# Patient Record
Sex: Female | Born: 1935 | Race: Black or African American | Hispanic: No | Smoking: Never smoker
Health system: Southern US, Community
[De-identification: ages and names within clinical notes are randomized; demographics above are authoritative.]

## PROBLEM LIST (undated history)

## (undated) DIAGNOSIS — F0280 Dementia in other diseases classified elsewhere without behavioral disturbance: Secondary | ICD-10-CM

## (undated) DIAGNOSIS — G309 Alzheimer's disease, unspecified: Secondary | ICD-10-CM

## (undated) DIAGNOSIS — F0390 Unspecified dementia without behavioral disturbance: Secondary | ICD-10-CM

## (undated) DIAGNOSIS — R32 Unspecified urinary incontinence: Secondary | ICD-10-CM

## (undated) DIAGNOSIS — E785 Hyperlipidemia, unspecified: Secondary | ICD-10-CM

## (undated) DIAGNOSIS — F329 Major depressive disorder, single episode, unspecified: Secondary | ICD-10-CM

## (undated) DIAGNOSIS — F32A Depression, unspecified: Secondary | ICD-10-CM

## (undated) DIAGNOSIS — I10 Essential (primary) hypertension: Secondary | ICD-10-CM

## (undated) DIAGNOSIS — N390 Urinary tract infection, site not specified: Secondary | ICD-10-CM

---

## 2004-05-13 ENCOUNTER — Ambulatory Visit: Payer: Self-pay

## 2004-06-04 ENCOUNTER — Other Ambulatory Visit: Payer: Self-pay

## 2004-06-04 ENCOUNTER — Emergency Department: Payer: Self-pay | Admitting: Emergency Medicine

## 2004-06-06 ENCOUNTER — Ambulatory Visit: Payer: Self-pay

## 2004-09-25 ENCOUNTER — Ambulatory Visit: Payer: Self-pay

## 2004-10-12 ENCOUNTER — Emergency Department: Payer: Self-pay | Admitting: Emergency Medicine

## 2005-02-03 ENCOUNTER — Other Ambulatory Visit: Payer: Self-pay

## 2005-02-03 ENCOUNTER — Emergency Department: Payer: Self-pay | Admitting: Emergency Medicine

## 2005-11-18 ENCOUNTER — Ambulatory Visit: Payer: Self-pay

## 2007-01-11 ENCOUNTER — Ambulatory Visit: Payer: Self-pay | Admitting: Internal Medicine

## 2007-09-19 ENCOUNTER — Ambulatory Visit: Payer: Self-pay | Admitting: Internal Medicine

## 2008-02-19 ENCOUNTER — Emergency Department: Payer: Self-pay | Admitting: Emergency Medicine

## 2008-03-22 ENCOUNTER — Ambulatory Visit: Payer: Self-pay | Admitting: Internal Medicine

## 2008-08-06 ENCOUNTER — Ambulatory Visit: Payer: Self-pay | Admitting: Internal Medicine

## 2009-03-25 ENCOUNTER — Ambulatory Visit: Payer: Self-pay | Admitting: Internal Medicine

## 2014-10-24 DIAGNOSIS — E782 Mixed hyperlipidemia: Secondary | ICD-10-CM | POA: Diagnosis not present

## 2014-10-24 DIAGNOSIS — F329 Major depressive disorder, single episode, unspecified: Secondary | ICD-10-CM | POA: Diagnosis not present

## 2014-10-24 DIAGNOSIS — I10 Essential (primary) hypertension: Secondary | ICD-10-CM | POA: Diagnosis not present

## 2014-10-24 DIAGNOSIS — G301 Alzheimer's disease with late onset: Secondary | ICD-10-CM | POA: Diagnosis not present

## 2014-12-05 DIAGNOSIS — L89313 Pressure ulcer of right buttock, stage 3: Secondary | ICD-10-CM | POA: Diagnosis not present

## 2015-01-22 DIAGNOSIS — F329 Major depressive disorder, single episode, unspecified: Secondary | ICD-10-CM | POA: Diagnosis not present

## 2015-01-22 DIAGNOSIS — G301 Alzheimer's disease with late onset: Secondary | ICD-10-CM | POA: Diagnosis not present

## 2015-01-22 DIAGNOSIS — E782 Mixed hyperlipidemia: Secondary | ICD-10-CM | POA: Diagnosis not present

## 2015-01-22 DIAGNOSIS — I10 Essential (primary) hypertension: Secondary | ICD-10-CM | POA: Diagnosis not present

## 2015-04-03 DIAGNOSIS — L259 Unspecified contact dermatitis, unspecified cause: Secondary | ICD-10-CM | POA: Diagnosis not present

## 2015-06-15 DIAGNOSIS — G479 Sleep disorder, unspecified: Secondary | ICD-10-CM | POA: Diagnosis not present

## 2015-06-15 DIAGNOSIS — N390 Urinary tract infection, site not specified: Secondary | ICD-10-CM | POA: Diagnosis not present

## 2015-06-15 DIAGNOSIS — M25562 Pain in left knee: Secondary | ICD-10-CM | POA: Diagnosis not present

## 2015-06-15 DIAGNOSIS — R350 Frequency of micturition: Secondary | ICD-10-CM | POA: Diagnosis not present

## 2015-06-16 DIAGNOSIS — R350 Frequency of micturition: Secondary | ICD-10-CM | POA: Diagnosis not present

## 2015-06-17 DIAGNOSIS — M25561 Pain in right knee: Secondary | ICD-10-CM | POA: Diagnosis not present

## 2015-06-17 DIAGNOSIS — M25562 Pain in left knee: Secondary | ICD-10-CM | POA: Diagnosis not present

## 2015-06-17 DIAGNOSIS — R5383 Other fatigue: Secondary | ICD-10-CM | POA: Diagnosis not present

## 2015-06-24 DIAGNOSIS — M170 Bilateral primary osteoarthritis of knee: Secondary | ICD-10-CM | POA: Diagnosis not present

## 2015-07-23 DIAGNOSIS — R300 Dysuria: Secondary | ICD-10-CM | POA: Diagnosis not present

## 2015-07-24 DIAGNOSIS — R300 Dysuria: Secondary | ICD-10-CM | POA: Diagnosis not present

## 2015-08-23 DIAGNOSIS — L89153 Pressure ulcer of sacral region, stage 3: Secondary | ICD-10-CM | POA: Diagnosis not present

## 2015-08-23 DIAGNOSIS — L89322 Pressure ulcer of left buttock, stage 2: Secondary | ICD-10-CM | POA: Diagnosis not present

## 2015-08-23 DIAGNOSIS — G301 Alzheimer's disease with late onset: Secondary | ICD-10-CM | POA: Diagnosis not present

## 2015-08-23 DIAGNOSIS — M25562 Pain in left knee: Secondary | ICD-10-CM | POA: Diagnosis not present

## 2015-09-26 ENCOUNTER — Encounter: Payer: Self-pay | Admitting: Emergency Medicine

## 2015-09-26 ENCOUNTER — Emergency Department: Payer: Medicare Other

## 2015-09-26 ENCOUNTER — Observation Stay
Admission: EM | Admit: 2015-09-26 | Discharge: 2015-09-28 | Disposition: A | Discharge status: Home / Self Care | Payer: Medicare Other | Attending: Internal Medicine | Admitting: Internal Medicine

## 2015-09-26 DIAGNOSIS — G309 Alzheimer's disease, unspecified: Secondary | ICD-10-CM | POA: Diagnosis not present

## 2015-09-26 DIAGNOSIS — E785 Hyperlipidemia, unspecified: Secondary | ICD-10-CM | POA: Insufficient documentation

## 2015-09-26 DIAGNOSIS — L89153 Pressure ulcer of sacral region, stage 3: Secondary | ICD-10-CM | POA: Diagnosis not present

## 2015-09-26 DIAGNOSIS — I10 Essential (primary) hypertension: Secondary | ICD-10-CM | POA: Diagnosis not present

## 2015-09-26 DIAGNOSIS — E860 Dehydration: Secondary | ICD-10-CM | POA: Insufficient documentation

## 2015-09-26 DIAGNOSIS — R7989 Other specified abnormal findings of blood chemistry: Secondary | ICD-10-CM

## 2015-09-26 DIAGNOSIS — L89322 Pressure ulcer of left buttock, stage 2: Secondary | ICD-10-CM | POA: Diagnosis not present

## 2015-09-26 DIAGNOSIS — R109 Unspecified abdominal pain: Secondary | ICD-10-CM | POA: Diagnosis not present

## 2015-09-26 DIAGNOSIS — E876 Hypokalemia: Secondary | ICD-10-CM | POA: Insufficient documentation

## 2015-09-26 DIAGNOSIS — D72829 Elevated white blood cell count, unspecified: Secondary | ICD-10-CM | POA: Insufficient documentation

## 2015-09-26 DIAGNOSIS — K5730 Diverticulosis of large intestine without perforation or abscess without bleeding: Secondary | ICD-10-CM | POA: Diagnosis not present

## 2015-09-26 DIAGNOSIS — L89159 Pressure ulcer of sacral region, unspecified stage: Secondary | ICD-10-CM | POA: Diagnosis not present

## 2015-09-26 DIAGNOSIS — R778 Other specified abnormalities of plasma proteins: Secondary | ICD-10-CM

## 2015-09-26 DIAGNOSIS — I214 Non-ST elevation (NSTEMI) myocardial infarction: Secondary | ICD-10-CM | POA: Diagnosis not present

## 2015-09-26 DIAGNOSIS — N281 Cyst of kidney, acquired: Secondary | ICD-10-CM | POA: Insufficient documentation

## 2015-09-26 DIAGNOSIS — L8990 Pressure ulcer of unspecified site, unspecified stage: Secondary | ICD-10-CM

## 2015-09-26 DIAGNOSIS — F329 Major depressive disorder, single episode, unspecified: Secondary | ICD-10-CM | POA: Diagnosis not present

## 2015-09-26 DIAGNOSIS — R000 Tachycardia, unspecified: Secondary | ICD-10-CM | POA: Diagnosis not present

## 2015-09-26 DIAGNOSIS — R748 Abnormal levels of other serum enzymes: Secondary | ICD-10-CM | POA: Diagnosis not present

## 2015-09-26 DIAGNOSIS — R749 Abnormal serum enzyme level, unspecified: Secondary | ICD-10-CM | POA: Insufficient documentation

## 2015-09-26 DIAGNOSIS — F0280 Dementia in other diseases classified elsewhere without behavioral disturbance: Secondary | ICD-10-CM | POA: Insufficient documentation

## 2015-09-26 DIAGNOSIS — K5900 Constipation, unspecified: Secondary | ICD-10-CM

## 2015-09-26 DIAGNOSIS — Z880 Allergy status to penicillin: Secondary | ICD-10-CM | POA: Insufficient documentation

## 2015-09-26 DIAGNOSIS — K5641 Fecal impaction: Secondary | ICD-10-CM | POA: Diagnosis not present

## 2015-09-26 DIAGNOSIS — G301 Alzheimer's disease with late onset: Secondary | ICD-10-CM | POA: Diagnosis not present

## 2015-09-26 DIAGNOSIS — M8580 Other specified disorders of bone density and structure, unspecified site: Secondary | ICD-10-CM | POA: Insufficient documentation

## 2015-09-26 HISTORY — DX: Unspecified dementia, unspecified severity, without behavioral disturbance, psychotic disturbance, mood disturbance, and anxiety: F03.90

## 2015-09-26 HISTORY — DX: Unspecified urinary incontinence: R32

## 2015-09-26 HISTORY — DX: Major depressive disorder, single episode, unspecified: F32.9

## 2015-09-26 HISTORY — DX: Alzheimer's disease, unspecified: G30.9

## 2015-09-26 HISTORY — DX: Hyperlipidemia, unspecified: E78.5

## 2015-09-26 HISTORY — DX: Dementia in other diseases classified elsewhere, unspecified severity, without behavioral disturbance, psychotic disturbance, mood disturbance, and anxiety: F02.80

## 2015-09-26 HISTORY — DX: Urinary tract infection, site not specified: N39.0

## 2015-09-26 HISTORY — DX: Depression, unspecified: F32.A

## 2015-09-26 HISTORY — DX: Essential (primary) hypertension: I10

## 2015-09-26 LAB — HEPATIC FUNCTION PANEL
ALK PHOS: 68 U/L (ref 38–126)
ALT: 13 U/L — AB (ref 14–54)
AST: 42 U/L — ABNORMAL HIGH (ref 15–41)
Albumin: 3 g/dL — ABNORMAL LOW (ref 3.5–5.0)
BILIRUBIN INDIRECT: 0.9 mg/dL (ref 0.3–0.9)
Bilirubin, Direct: 0.3 mg/dL (ref 0.1–0.5)
TOTAL PROTEIN: 6.8 g/dL (ref 6.5–8.1)
Total Bilirubin: 1.2 mg/dL (ref 0.3–1.2)

## 2015-09-26 LAB — CBC
HCT: 36.9 % (ref 35.0–47.0)
HEMOGLOBIN: 12.3 g/dL (ref 12.0–16.0)
MCH: 31 pg (ref 26.0–34.0)
MCHC: 33.5 g/dL (ref 32.0–36.0)
MCV: 92.6 fL (ref 80.0–100.0)
Platelets: 290 10*3/uL (ref 150–440)
RBC: 3.99 MIL/uL (ref 3.80–5.20)
RDW: 13.6 % (ref 11.5–14.5)
WBC: 13.6 10*3/uL — ABNORMAL HIGH (ref 3.6–11.0)

## 2015-09-26 LAB — BASIC METABOLIC PANEL
ANION GAP: 14 (ref 5–15)
BUN: 20 mg/dL (ref 6–20)
CALCIUM: 9.8 mg/dL (ref 8.9–10.3)
CHLORIDE: 110 mmol/L (ref 101–111)
CO2: 22 mmol/L (ref 22–32)
Creatinine, Ser: 0.61 mg/dL (ref 0.44–1.00)
GFR calc non Af Amer: >60 mL/min (ref >60)
Glucose, Bld: 129 mg/dL — ABNORMAL HIGH (ref 65–99)
Potassium: 4.2 mmol/L (ref 3.5–5.1)
Sodium: 146 mmol/L — ABNORMAL HIGH (ref 135–145)

## 2015-09-26 LAB — TROPONIN I
Troponin I: 0.15 ng/mL — ABNORMAL HIGH (ref <0.031)
Troponin I: 0.16 ng/mL — ABNORMAL HIGH (ref <0.031)

## 2015-09-26 LAB — MAGNESIUM: Magnesium: 1.9 mg/dL (ref 1.7–2.4)

## 2015-09-26 LAB — PROTIME-INR
INR: 1.18
Prothrombin Time: 15.2 seconds — ABNORMAL HIGH (ref 11.4–15.0)

## 2015-09-26 LAB — APTT: APTT: 32 s (ref 24–36)

## 2015-09-26 MED ORDER — ACETAMINOPHEN 650 MG RE SUPP: 650.0000 mg | Freq: Four times a day (QID) | RECTAL | Status: DC | PRN

## 2015-09-26 MED ORDER — METOPROLOL TARTRATE 25 MG PO TABS
12.5000 mg | ORAL_TABLET | Freq: Two times a day (BID) | ORAL | Status: DC
  Administered 2015-09-26 – 2015-09-28 (×3): 12.5 mg via ORAL
  Filled 2015-09-26 (×4): qty 1

## 2015-09-26 MED ORDER — NITROGLYCERIN 0.4 MG SL SUBL: 0.4000 mg | SUBLINGUAL_TABLET | SUBLINGUAL | Status: DC | PRN

## 2015-09-26 MED ORDER — MIRTAZAPINE 15 MG PO TABS
15.0000 mg | ORAL_TABLET | Freq: Every day | ORAL | Status: DC
  Administered 2015-09-26 – 2015-09-27 (×2): 15 mg via ORAL
  Filled 2015-09-26 (×2): qty 1

## 2015-09-26 MED ORDER — SODIUM CHLORIDE 0.9% FLUSH: 3.0000 mL | INTRAVENOUS | Status: DC | PRN

## 2015-09-26 MED ORDER — SODIUM CHLORIDE 0.9 % IV SOLN
Freq: Once | INTRAVENOUS | Status: AC
  Administered 2015-09-26: 15:00:00 via INTRAVENOUS

## 2015-09-26 MED ORDER — ONDANSETRON HCL 4 MG/2ML IJ SOLN: 4.0000 mg | Freq: Four times a day (QID) | INTRAMUSCULAR | Status: DC | PRN

## 2015-09-26 MED ORDER — ACETAMINOPHEN 325 MG PO TABS: 650.0000 mg | ORAL_TABLET | Freq: Four times a day (QID) | ORAL | Status: DC | PRN

## 2015-09-26 MED ORDER — ATORVASTATIN CALCIUM 20 MG PO TABS: 40.0000 mg | ORAL_TABLET | Freq: Every day | ORAL | Status: DC

## 2015-09-26 MED ORDER — HEPARIN (PORCINE) IN NACL 100-0.45 UNIT/ML-% IJ SOLN
800.0000 [IU]/h | INTRAMUSCULAR | Status: DC
  Administered 2015-09-26: 700 [IU]/h via INTRAVENOUS
  Filled 2015-09-26 (×2): qty 250

## 2015-09-26 MED ORDER — IOHEXOL 300 MG/ML  SOLN
80.0000 mL | Freq: Once | INTRAMUSCULAR | Status: AC | PRN
  Administered 2015-09-26: 80 mL via INTRAVENOUS

## 2015-09-26 MED ORDER — MEMANTINE HCL ER 28 MG PO CP24
28.0000 mg | ORAL_CAPSULE | Freq: Every day | ORAL | Status: DC
  Administered 2015-09-27 – 2015-09-28 (×2): 28 mg via ORAL
  Filled 2015-09-26 (×3): qty 1

## 2015-09-26 MED ORDER — ASPIRIN 81 MG PO CHEW
81.0000 mg | CHEWABLE_TABLET | Freq: Every day | ORAL | Status: DC
  Administered 2015-09-27 – 2015-09-28 (×2): 81 mg via ORAL
  Filled 2015-09-26 (×2): qty 1

## 2015-09-26 MED ORDER — ALBUTEROL SULFATE (2.5 MG/3ML) 0.083% IN NEBU: 2.5000 mg | INHALATION_SOLUTION | RESPIRATORY_TRACT | Status: DC | PRN

## 2015-09-26 MED ORDER — HEPARIN BOLUS VIA INFUSION
3500.0000 [IU] | Freq: Once | INTRAVENOUS | Status: AC
  Administered 2015-09-26: 3500 [IU] via INTRAVENOUS
  Filled 2015-09-26: qty 3500

## 2015-09-26 MED ORDER — SODIUM CHLORIDE 0.9% FLUSH
3.0000 mL | Freq: Two times a day (BID) | INTRAVENOUS | Status: DC
  Administered 2015-09-28: 3 mL via INTRAVENOUS

## 2015-09-26 MED ORDER — SIMVASTATIN 40 MG PO TABS
40.0000 mg | ORAL_TABLET | Freq: Every day | ORAL | Status: DC
  Administered 2015-09-27: 40 mg via ORAL
  Filled 2015-09-26: qty 1

## 2015-09-26 MED ORDER — ONDANSETRON HCL 4 MG PO TABS
4.0000 mg | ORAL_TABLET | Freq: Four times a day (QID) | ORAL | Status: DC | PRN
Start: 2015-09-26 — End: 2015-09-28

## 2015-09-26 MED ORDER — SODIUM CHLORIDE 0.9 % IV SOLN: 250.0000 mL | INTRAVENOUS | Status: DC | PRN

## 2015-09-26 MED ORDER — SODIUM CHLORIDE 0.9 % IV SOLN
INTRAVENOUS | Status: DC
Start: 2015-09-26 — End: 2015-09-28
  Administered 2015-09-26 – 2015-09-27 (×4): via INTRAVENOUS

## 2015-09-26 MED ORDER — CITALOPRAM HYDROBROMIDE 20 MG PO TABS
10.0000 mg | ORAL_TABLET | Freq: Every day | ORAL | Status: DC
  Administered 2015-09-26 – 2015-09-27 (×2): 10 mg via ORAL
  Filled 2015-09-26 (×3): qty 1

## 2015-09-26 NOTE — ED Notes (Signed)
Went to [place] [facility] for  pressure ulcer care and they noted her tachycardia. No cardiac hx - has advance alz-dementia. dtr was asking about hospice as to whether she could be admitted there until she is better.

## 2015-09-26 NOTE — H&P (Addendum)
[facility] - [place] at [facility]   PATIENT NAME: [name]    MR#:  [id number]  DATE OF BIRTH:  1935/12/28  DATE OF ADMISSION:  09/26/2015  PRIMARY CARE PHYSICIAN: No primary care provider on file.   REQUESTING/REFERRING PHYSICIAN: Arnaldo Natal, MD  CHIEF COMPLAINT:   Chief Complaint  Patient presents with  . Tachycardia   Tachycardia. HISTORY OF PRESENT ILLNESS:  [name]  is a 80 y.o. female with a known history of dementia, UTI, hypertension and hyperlipidemia. The patient was sent by primary care physician due to tachycardia today. The patient is demented, unable to provide any information. According to her daughter, the patient has had chronic diarrhea, possible incontinence. She developed decubitus skin tear around rectum and buttocks. The patient was sent to permit care physician for evaluation of the skin tear around rectum. But she was found to have tachycardia and then sent to ED for further evaluation. Her heart rates was about 120 to 130. In addition, she has elevated troponin at 0.15.  PAST MEDICAL HISTORY:   Past Medical History  Diagnosis Date  . Dementia   . Alzheimer's dementia   . Hyperlipidemia   . Depression   . Hypertension   . Incontinence   . UTI (lower urinary tract infection)     PAST SURGICAL HISTORY:  History reviewed. No pertinent past surgical history.  SOCIAL HISTORY:   Social History  Substance Use Topics  . Smoking status: Never Smoker   . Smokeless tobacco: Not on file  . Alcohol Use: No    FAMILY HISTORY:  History reviewed. No pertinent family history.  DRUG ALLERGIES:   Allergies  Allergen Reactions  . Penicillins     REVIEW OF SYSTEMS:  Unable to obtain.  MEDICATIONS AT HOME:   Prior to Admission medications   Medication Sig Start Date End Date Taking? Authorizing Provider  citalopram (CELEXA) 10 MG tablet Take 1 tablet by mouth daily. 09/06/15  Yes Historical Provider, MD   mirtazapine (REMERON) 15 MG tablet Take 1 tablet by mouth at bedtime.  09/06/15  Yes Historical Provider, MD  NAMENDA XR 28 MG CP24 24 hr capsule Take 1 tablet by mouth daily. 08/05/15  Yes Historical Provider, MD  simvastatin (ZOCOR) 40 MG tablet Take 1 tablet by mouth daily at 6 PM.  08/06/15  Yes Historical Provider, MD  valsartan (DIOVAN) 40 MG tablet Take 1 tablet by mouth daily. 08/23/15  Yes Historical Provider, MD      VITAL SIGNS:  Blood pressure 122/76, pulse 120, temperature 98.7 F (37.1 C), temperature source Axillary, resp. rate 16, height 5\' 3"  (1.6 m), weight 58.968 kg (130 lb), SpO2 98 %.  PHYSICAL EXAMINATION:  GENERAL:  80 y.o.-year-old patient lying in the bed with no acute distress.  EYES: Unable to exam since the patient closed her eyes. HEENT: Head atraumatic, normocephalic. Unable to exam Oropharynx and nasopharynx. NECK:  Supple, no jugular venous distention. No thyroid enlargement, no tenderness.  LUNGS: Normal breath sounds bilaterally, no wheezing, rales,rhonchi or crepitation. No use of accessory muscles of respiration.  CARDIOVASCULAR: S1, S2 normal. No murmurs, rubs, or gallops.  ABDOMEN: Soft, nontender, nondistended. Bowel sounds present. No organomegaly or mass.  EXTREMITIES: No pedal edema, cyanosis, or clubbing.  NEUROLOGIC: Demented.  PSYCHIATRIC: The patient is severely demented. SKIN: No obvious rash, lesion, but has buttock ulcer.   LABORATORY PANEL:   CBC  Recent Labs Lab 09/26/15 1232  WBC 13.6*  HGB 12.3  HCT  36.9  PLT 290   ------------------------------------------------------------------------------------------------------------------  Chemistries   Recent Labs Lab 09/26/15 1232  NA 146*  K 4.2  CL 110  CO2 22  GLUCOSE 129*  BUN 20  CREATININE 0.61  CALCIUM 9.8  AST 42*  ALT 13*  ALKPHOS 68  BILITOT 1.2    ------------------------------------------------------------------------------------------------------------------  Cardiac Enzymes  Recent Labs Lab 09/26/15 1232  TROPONINI 0.15*   ------------------------------------------------------------------------------------------------------------------  RADIOLOGY:  Ct Abdomen Pelvis W Contrast  09/26/2015  CLINICAL DATA:  Abdominal pain EXAM: CT ABDOMEN AND PELVIS WITH CONTRAST TECHNIQUE: Multidetector CT imaging of the abdomen and pelvis was performed using the standard protocol following bolus administration of intravenous contrast. CONTRAST:  80mL OMNIPAQUE IOHEXOL 300 MG/ML  SOLN COMPARISON:  None. FINDINGS: Lung bases are unremarkable. Sagittal images of the spine shows diffuse osteopenia. There is mild about 4 mm anterolisthesis L5 on S1 vertebral body. Heart size within normal limits.  Mild levoscoliosis lumbar spine. Enhanced liver shows no focal mass. There is mild intrahepatic biliary ductal dilatation. There gallbladder is not identified probable surgically absent. No aortic aneurysm. Mild atherosclerotic calcifications of distal abdominal aorta. The pancreas, spleen and adrenal glands are unremarkable. Kidneys are symmetrical in size and enhancement. No hydronephrosis or hydroureter. Delayed renal images shows bilateral renal symmetrical excretion. Bilateral visualized proximal ureter is unremarkable. Tiny left renal cysts are noted the largest measures 6.5 mm. There is no small bowel obstruction.  No ascites or free air. Moderate stool noted within cecum and right colon. Some colonic gas noted within transverse colon. Few colonic diverticula are noted descending colon. No evidence of colitis or diverticulitis. Multiple colonic diverticula are noted sigmoid colon. There is no evidence of acute diverticulitis. There is significant distension of the rectum with abundant stool measures at least 8.5 cm in diameter. This is consistent with fecal  impaction. Axial image 66 there is a skin defect and probable decubitus wound in left lower gluteal region adjacent to the midline please see axial image 69. There is air within subcutaneous wound measures about 1.9 cm. There is no evidence of air-fluid level to suggest an abscess. Some adjacent skin is thickening is noted probable mild cellulitis. No destructive bony lesions are noted within pelvis. Degenerative changes bilateral SI joints. The urinary bladder is decompressed. IMPRESSION: 1. No hydronephrosis or hydroureter. Small left renal cysts are noted. 2. No small bowel obstruction.  No ascites or free air. 3. Moderate stool noted in right colon and proximal transverse colon. Significant distension of the rectum with abundant stool up to 8.5 cm. Findings highly suspicious for distal fecal impaction. 4. Axial image 66 there is a skin defect and probable decubitus wound in left lower gluteal region adjacent to the midline please see axial image 69. There is air within subcutaneous wound measures about 1.9 cm. There is no evidence of air-fluid level to suggest an abscess. Some adjacent skin is thickening is noted probable mild cellulitis. 5. Osteopenia and mild degenerative changes lumbar spine. Mild degenerative changes bilateral SI joints. Electronically Signed   By: Natasha Mead M.D.   On: 09/26/2015 16:34   Dg Chest Portable 1 View  09/26/2015  CLINICAL DATA:  Tachycardia. EXAM: PORTABLE CHEST 1 VIEW COMPARISON:  02/03/2005 FINDINGS: Lungs are clear without airspace disease or pulmonary edema. There appears to be a large skin fold overlying the left chest. Patient is mildly rotated towards the right side. Heart and mediastinum are within normal limits. Negative for pneumothorax. IMPRESSION: No acute chest findings. Electronically Signed  By: Richarda Overlie M.D.   On: 09/26/2015 13:59    EKG:   Orders placed or performed during the hospital encounter of 09/26/15  . EKG 12-Lead  . EKG 12-Lead  . ED EKG  within 10 minutes  . ED EKG within 10 minutes    IMPRESSION AND PLAN:   Possible non-STEMI.  Start aspirin and Zocor, start heparin drip, follow-up troponin level and cardiology consult.  Tachycardia. Start Lopressor twice a day.  Leukocytosis. Follow-up CBC and a urinalysis.  Sacral DU. Wound care.  Hypertension,  hold volsartan due to and normal blood pressure, start Lopressor Hyperlipidemia, start Zocor.    All the records are reviewed and case discussed with ED provider. Management plans discussed with the patient's daughter [facility]) and they are in agreement.  CODE STATUS: DO NOT RESUSCITATE  TOTAL TIME TAKING CARE OF THIS PATIENT: 55 minutes.    Shaune Pollack M.D on 09/26/2015 at 5:35 PM  Between 7am to 6pm - Pager - [phone]  After 6pm go to [email or web address] - password [email or web address]  [place] [facility]  Office  [phone]  CC: Primary care physician; No primary care provider on file.

## 2015-09-26 NOTE — ED Provider Notes (Signed)
[facility] Emergency Department Provider Note  ____________________________________________  Time seen: Approximately 2:37 PM  I have reviewed the triage vital signs and the nursing notes.   HISTORY  Chief Complaint Tachycardia  history limited by severe dementia   HPI [name] is a 80 y.o. female patient being cared for at home. She's been having diarrhea. She's developed a decubitus around her rectum and some more on the buttocks does area of skin breakdown and appears to be healing on the right hip and some swelling in the left hip. Patient with [facility] to follow-up for the decubiti was noted be tachycardic so was sent here. She has severe dementia and unable to get a history from her. History obtained is from family. Family additionally reports that usually patient's diaper soaked in the morning when she wakes up and today it was not. Today in fact it was completely dry.   Past Medical History  Diagnosis Date  . Dementia   . Alzheimer's dementia   . Hyperlipidemia   . Depression   . Hypertension   . Incontinence   . UTI (lower urinary tract infection)     There are no active problems to display for this patient.   History reviewed. No pertinent past surgical history.  No current outpatient prescriptions on file.  Allergies Penicillins  History reviewed. No pertinent family history.  Social History Social History  Substance Use Topics  . Smoking status: Never Smoker   . Smokeless tobacco: None  . Alcohol Use: No    Review of Systems Unobtainable due to dementia ____________________________________________   PHYSICAL EXAM:  VITAL SIGNS: ED Triage Vitals  Enc Vitals Group     BP 09/26/15 1214 101/73 mmHg     Pulse Rate 09/26/15 1214 128     Resp 09/26/15 1214 16     Temp 09/26/15 1214 98.7 F (37.1 C)     Temp Source 09/26/15 1214 Axillary     SpO2 09/26/15 1214 100 %     Weight 09/26/15 1214 130 lb (58.968 kg)      Height 09/26/15 1214  (1.6 m)     Head Cir --      Peak Flow --      Pain Score --      Pain Loc --      Pain Edu? --      Excl. in GC? --     Constitutional: Alert  in no acute distress. Eyes: Patient's keeping her eyes closed holes shot tighter when I try to open them she'll not open her eyes Head: Atraumatic. Nose: No congestion/rhinnorhea. Mouth/Throat: Patient will not open her mouth either but her lips look dry Neck: No stridor.   Cardiovascular: Normal rate, regular rhythm. Grossly normal heart sounds.  Good peripheral circulation. Respiratory: Normal respiratory effort.  No retractions. Lungs CTAB. Gastrointestinal: Patient winces on palpation of the abdomen No distention. No abdominal bruits. No CVA tenderness. Musculoskeletal: There is no edema in the extremities however the skin changes and swelling is noted in H&P of present Neurologic:  Normal speech and language. No gross focal neurologic deficits are appreciated. No gait instability. Skin:  Decubiti as noted in H&P   ____________________________________________   LABS (all labs ordered are listed, but only abnormal results are displayed)  Labs Reviewed  BASIC METABOLIC PANEL - Abnormal; Notable for the following:    Sodium 146 (*)    Glucose, Bld 129 (*)    All other components within normal  limits  CBC - Abnormal; Notable for the following:    WBC 13.6 (*)    All other components within normal limits  HEPATIC FUNCTION PANEL - Abnormal; Notable for the following:    Albumin 3.0 (*)    AST 42 (*)    ALT 13 (*)    All other components within normal limits  TROPONIN I - Abnormal; Notable for the following:    Troponin I 0.15 (*)    All other components within normal limits  URINALYSIS COMPLETEWITH MICROSCOPIC ([facility] ONLY)   ____________________________________________  EKG  EKG read and interpreted by me shows sinus tachycardia rate of 138 left axis no acute ST-T wave changes baseline is very  poor is difficult to evaluate EKG well ____________________________________________  RADIOLOGY  Pending Dr. Scotty Court will follow-up ____________________________________________   PROCEDURES    ____________________________________________   INITIAL IMPRESSION / ASSESSMENT AND PLAN / ED COURSE  Pertinent labs & imaging results that were available during my care of the patient were reviewed by me and considered in my medical decision making (see chart for details).  She is signed out to Dr. Scotty Court ____________________________________________   FINAL CLINICAL IMPRESSION(S) / ED DIAGNOSES  Final diagnoses:  Tachycardia  Elevated troponin      Arnaldo Natal, MD 09/26/15 [phone]

## 2015-09-26 NOTE — Progress Notes (Signed)
Pt needs urine specimen. However pt is incontinent. Nursing staff attempted to put pt on bedpan, attempts were unsuccessful. MD Sudini notified. Order given for in and out catheter.  Mayra Neer M

## 2015-09-26 NOTE — Progress Notes (Addendum)
ANTICOAGULATION CONSULT NOTE - Initial Consult  Pharmacy Consult for Heparin  Indication: chest pain/ACS  Allergies  Allergen Reactions  . Penicillins     Patient Measurements: Height:  (160 cm) Weight: 130 lb (58.968 kg) IBW/kg (Calculated) : 52.4 Heparin Dosing Weight: 59 kg   Vital Signs: Temp: 98.7 F (37.1 C) (02/23 1214) Temp Source: Axillary (02/23 1214) BP: 99/70 mmHg (02/23 1807) Pulse Rate: 103 (02/23 1807)  Labs:  Recent Labs  09/26/15 1232  HGB 12.3  HCT 36.9  PLT 290  CREATININE 0.61  TROPONINI 0.15*    Estimated Creatinine Clearance: 48.7 mL/min (by C-G formula based on Cr of 0.61).   Medical History: Past Medical History  Diagnosis Date  . Dementia   . Alzheimer's dementia   . Hyperlipidemia   . Depression   . Hypertension   . Incontinence   . UTI (lower urinary tract infection)     Medications:  Prescriptions prior to admission  Medication Sig Dispense Refill Last Dose  . citalopram (CELEXA) 10 MG tablet Take 1 tablet by mouth daily.   09/26/2015 at Unknown time  . mirtazapine (REMERON) 15 MG tablet Take 1 tablet by mouth at bedtime.    09/25/2015 at Unknown time  . NAMENDA XR 28 MG CP24 24 hr capsule Take 1 tablet by mouth daily.     . simvastatin (ZOCOR) 40 MG tablet Take 1 tablet by mouth daily at 6 PM.      . valsartan (DIOVAN) 40 MG tablet Take 1 tablet by mouth daily.       Assessment: CrCl = 48.7 ml/min  Pharmacy consulted to dose heparin in this 80 year old female admitted with ACS/NSTEMI. No prior anticoag noted.   Goal of Therapy:  Heparin level 0.3-0.7 units/ml Monitor platelets by anticoagulation protocol: Yes   Plan:  Give 3500 units bolus x 1 Start heparin infusion at 700 units/hr  Will order baseline aptt and INR. Will draw 1st HL 8 hrs after start of drip on 2/24 @ 0400.   Hagan Maltz D 09/26/2015,6:35 PM

## 2015-09-26 NOTE — ED Notes (Signed)
Per daught pt's depends was dry last night when normally she wakes up soaked

## 2015-09-26 NOTE — ED Notes (Signed)
Patient transported to CT 

## 2015-09-27 ENCOUNTER — Encounter: Payer: Self-pay | Admitting: Nurse Practitioner

## 2015-09-27 ENCOUNTER — Inpatient Hospital Stay (HOSPITAL_BASED_OUTPATIENT_CLINIC_OR_DEPARTMENT_OTHER)
Admit: 2015-09-27 | Discharge: 2015-09-27 | Disposition: A | Discharge status: Home / Self Care | Payer: Medicare Other | Attending: Nurse Practitioner | Admitting: Nurse Practitioner

## 2015-09-27 DIAGNOSIS — L89159 Pressure ulcer of sacral region, unspecified stage: Secondary | ICD-10-CM

## 2015-09-27 DIAGNOSIS — R000 Tachycardia, unspecified: Secondary | ICD-10-CM | POA: Diagnosis not present

## 2015-09-27 DIAGNOSIS — D72829 Elevated white blood cell count, unspecified: Secondary | ICD-10-CM

## 2015-09-27 DIAGNOSIS — I10 Essential (primary) hypertension: Secondary | ICD-10-CM | POA: Diagnosis not present

## 2015-09-27 DIAGNOSIS — K5900 Constipation, unspecified: Secondary | ICD-10-CM | POA: Diagnosis not present

## 2015-09-27 DIAGNOSIS — R7989 Other specified abnormal findings of blood chemistry: Secondary | ICD-10-CM

## 2015-09-27 DIAGNOSIS — L8990 Pressure ulcer of unspecified site, unspecified stage: Secondary | ICD-10-CM

## 2015-09-27 DIAGNOSIS — R778 Other specified abnormalities of plasma proteins: Secondary | ICD-10-CM

## 2015-09-27 DIAGNOSIS — E785 Hyperlipidemia, unspecified: Secondary | ICD-10-CM | POA: Diagnosis not present

## 2015-09-27 LAB — BASIC METABOLIC PANEL
Anion gap: 5 (ref 5–15)
BUN: 15 mg/dL (ref 6–20)
CO2: 25 mmol/L (ref 22–32)
Calcium: 8.9 mg/dL (ref 8.9–10.3)
Chloride: 118 mmol/L — ABNORMAL HIGH (ref 101–111)
Creatinine, Ser: 0.6 mg/dL (ref 0.44–1.00)
GFR calc Af Amer: >60 mL/min (ref >60)
GFR calc non Af Amer: >60 mL/min (ref >60)
Glucose, Bld: 76 mg/dL (ref 65–99)
Potassium: 3.1 mmol/L — ABNORMAL LOW (ref 3.5–5.1)
Sodium: 148 mmol/L — ABNORMAL HIGH (ref 135–145)

## 2015-09-27 LAB — URINALYSIS COMPLETE WITH MICROSCOPIC (ARMC ONLY)
BILIRUBIN URINE: NEGATIVE
Bacteria, UA: NONE SEEN
Glucose, UA: NEGATIVE mg/dL
Hgb urine dipstick: NEGATIVE
Leukocytes, UA: NEGATIVE
Nitrite: NEGATIVE
Protein, ur: NEGATIVE mg/dL
Specific Gravity, Urine: 1.032 — ABNORMAL HIGH (ref 1.005–1.030)
Squamous Epithelial / LPF: NONE SEEN
pH: 5 (ref 5.0–8.0)

## 2015-09-27 LAB — HEPARIN LEVEL (UNFRACTIONATED)
Heparin Unfractionated: 0.27 IU/mL — ABNORMAL LOW (ref 0.30–0.70)
Heparin Unfractionated: <0.1 IU/mL — ABNORMAL LOW (ref 0.30–0.70)

## 2015-09-27 LAB — CBC
HCT: 28.5 % — ABNORMAL LOW (ref 35.0–47.0)
Hemoglobin: 9.5 g/dL — ABNORMAL LOW (ref 12.0–16.0)
MCH: 31.1 pg (ref 26.0–34.0)
MCHC: 33.5 g/dL (ref 32.0–36.0)
MCV: 92.9 fL (ref 80.0–100.0)
Platelets: 169 10*3/uL (ref 150–440)
RBC: 3.07 MIL/uL — ABNORMAL LOW (ref 3.80–5.20)
RDW: 13.4 % (ref 11.5–14.5)
WBC: 6.2 10*3/uL (ref 3.6–11.0)

## 2015-09-27 LAB — TROPONIN I
TROPONIN I: 0.16 ng/mL — AB (ref <0.031)
Troponin I: 0.14 ng/mL — ABNORMAL HIGH (ref <0.031)

## 2015-09-27 MED ORDER — COLLAGENASE 250 UNIT/GM EX OINT
TOPICAL_OINTMENT | Freq: Every day | CUTANEOUS | Status: DC
  Administered 2015-09-27 – 2015-09-28 (×2): via TOPICAL
  Filled 2015-09-27: qty 30

## 2015-09-27 MED ORDER — LACTULOSE 10 GM/15ML PO SOLN
30.0000 g | Freq: Two times a day (BID) | ORAL | Status: DC
  Administered 2015-09-27: 30 g via ORAL
  Filled 2015-09-27 (×5): qty 60

## 2015-09-27 MED ORDER — MAGNESIUM HYDROXIDE 400 MG/5ML PO SUSP
30.0000 mL | Freq: Four times a day (QID) | ORAL | Status: DC | PRN
  Administered 2015-09-27: 30 mL via ORAL
  Filled 2015-09-27: qty 30

## 2015-09-27 MED ORDER — HEPARIN BOLUS VIA INFUSION
900.0000 [IU] | Freq: Once | INTRAVENOUS | Status: AC
  Administered 2015-09-27: 900 [IU] via INTRAVENOUS
  Filled 2015-09-27: qty 900

## 2015-09-27 MED ORDER — POLYETHYLENE GLYCOL 3350 17 G PO PACK
17.0000 g | PACK | Freq: Every day | ORAL | Status: DC
  Administered 2015-09-27: 17 g via ORAL
  Filled 2015-09-27 (×2): qty 1

## 2015-09-27 MED ORDER — DOCUSATE SODIUM 100 MG PO CAPS
100.0000 mg | ORAL_CAPSULE | Freq: Two times a day (BID) | ORAL | Status: DC
  Administered 2015-09-27 (×2): 100 mg via ORAL
  Filled 2015-09-27 (×3): qty 1

## 2015-09-27 NOTE — Progress Notes (Signed)
Pt tolerated in and out catheter well. 150 mL of urine obtained, specimen was sent. Will continue to monitor.   Mayra Neer M

## 2015-09-27 NOTE — Progress Notes (Signed)
[facility] - [place] at [facility]   PATIENT NAME: [name]    MR#:  [id number]  DATE OF BIRTH:  02-17-1936  SUBJECTIVE:   Daughter at bedside. Daughter reports the patient has had several episodes of constipation and then has had loose stools. Patient with dementia and unable to provide history of present illness.  REVIEW OF SYSTEMS:    Review of Systems  Unable to perform ROS   Tolerating Diet: yes      DRUG ALLERGIES:   Allergies  Allergen Reactions  . Penicillins     VITALS:  Blood pressure 116/67, pulse 93, temperature 98.3 F (36.8 C), temperature source Oral, resp. rate 20, height 5\' 3"  (1.6 m), weight 58.968 kg (130 lb), SpO2 98 %.  PHYSICAL EXAMINATION:   Physical Exam  Constitutional: She is well-developed, well-nourished, and in no distress. No distress.  HENT:  Head: Normocephalic and atraumatic.  Eyes: Pupils are equal, round, and reactive to light. No scleral icterus.  Neck: Neck supple. No JVD present. No tracheal deviation present.  Cardiovascular: Normal rate, regular rhythm and normal heart sounds.  Exam reveals no gallop and no friction rub.   No murmur heard. Pulmonary/Chest: Effort normal and breath sounds normal. No respiratory distress. She has no wheezes. She has no rales. She exhibits no tenderness.  Abdominal: Soft. Bowel sounds are normal. She exhibits mass. She exhibits no distension. There is no tenderness. There is no rebound and no guarding.  Musculoskeletal: Normal range of motion. She exhibits no edema.  Neurological: She is alert.  Skin: Skin is warm. No rash noted. She is not diaphoretic. No erythema.  Psychiatric: Judgment normal.      LABORATORY PANEL:   CBC  Recent Labs Lab 09/27/15 0647  WBC 6.2  HGB 9.5*  HCT 28.5*  PLT 169   ------------------------------------------------------------------------------------------------------------------  Chemistries   Recent Labs Lab  09/26/15 1232 09/26/15 1901 09/27/15 0647  NA 146*  --  148*  K 4.2  --  3.1*  CL 110  --  118*  CO2 22  --  25  GLUCOSE 129*  --  76  BUN 20  --  15  CREATININE 0.61  --  0.60  CALCIUM 9.8  --  8.9  MG  --  1.9  --   AST 42*  --   --   ALT 13*  --   --   ALKPHOS 68  --   --   BILITOT 1.2  --   --    ------------------------------------------------------------------------------------------------------------------  Cardiac Enzymes  Recent Labs Lab 09/26/15 1901 09/27/15 0025 09/27/15 0647  TROPONINI 0.16* 0.16* 0.14*   ------------------------------------------------------------------------------------------------------------------  RADIOLOGY:  Ct Abdomen Pelvis W Contrast  09/26/2015  CLINICAL DATA:  Abdominal pain EXAM: CT ABDOMEN AND PELVIS WITH CONTRAST TECHNIQUE: Multidetector CT imaging of the abdomen and pelvis was performed using the standard protocol following bolus administration of intravenous contrast. CONTRAST:  80mL OMNIPAQUE IOHEXOL 300 MG/ML  SOLN COMPARISON:  None. FINDINGS: Lung bases are unremarkable. Sagittal images of the spine shows diffuse osteopenia. There is mild about 4 mm anterolisthesis L5 on S1 vertebral body. Heart size within normal limits.  Mild levoscoliosis lumbar spine. Enhanced liver shows no focal mass. There is mild intrahepatic biliary ductal dilatation. There gallbladder is not identified probable surgically absent. No aortic aneurysm. Mild atherosclerotic calcifications of distal abdominal aorta. The pancreas, spleen and adrenal glands are unremarkable. Kidneys are symmetrical in size and enhancement. No hydronephrosis or hydroureter. Delayed renal images  shows bilateral renal symmetrical excretion. Bilateral visualized proximal ureter is unremarkable. Tiny left renal cysts are noted the largest measures 6.5 mm. There is no small bowel obstruction.  No ascites or free air. Moderate stool noted within cecum and right colon. Some colonic gas  noted within transverse colon. Few colonic diverticula are noted descending colon. No evidence of colitis or diverticulitis. Multiple colonic diverticula are noted sigmoid colon. There is no evidence of acute diverticulitis. There is significant distension of the rectum with abundant stool measures at least 8.5 cm in diameter. This is consistent with fecal impaction. Axial image 66 there is a skin defect and probable decubitus wound in left lower gluteal region adjacent to the midline please see axial image 69. There is air within subcutaneous wound measures about 1.9 cm. There is no evidence of air-fluid level to suggest an abscess. Some adjacent skin is thickening is noted probable mild cellulitis. No destructive bony lesions are noted within pelvis. Degenerative changes bilateral SI joints. The urinary bladder is decompressed. IMPRESSION: 1. No hydronephrosis or hydroureter. Small left renal cysts are noted. 2. No small bowel obstruction.  No ascites or free air. 3. Moderate stool noted in right colon and proximal transverse colon. Significant distension of the rectum with abundant stool up to 8.5 cm. Findings highly suspicious for distal fecal impaction. 4. Axial image 66 there is a skin defect and probable decubitus wound in left lower gluteal region adjacent to the midline please see axial image 69. There is air within subcutaneous wound measures about 1.9 cm. There is no evidence of air-fluid level to suggest an abscess. Some adjacent skin is thickening is noted probable mild cellulitis. 5. Osteopenia and mild degenerative changes lumbar spine. Mild degenerative changes bilateral SI joints. Electronically Signed   By: Natasha Mead M.D.   On: 09/26/2015 16:34   Dg Chest Portable 1 View  09/26/2015  CLINICAL DATA:  Tachycardia. EXAM: PORTABLE CHEST 1 VIEW COMPARISON:  02/03/2005 FINDINGS: Lungs are clear without airspace disease or pulmonary edema. There appears to be a large skin fold overlying the left  chest. Patient is mildly rotated towards the right side. Heart and mediastinum are within normal limits. Negative for pneumothorax. IMPRESSION: No acute chest findings. Electronically Signed   By: Richarda Overlie M.D.   On: 09/26/2015 13:59     ASSESSMENT AND PLAN:    80 year old female with dementia who presented with tachycardia.  1. Sinus tachycardia: Heart rates are improved.  2. Distal fecal impaction: CT scan is consistent with significant distention of the rectum with abundant stool up to 8 cm. We'll try stool softeners including lactulose and MiraLAX. We'll also have nurse try disimpaction. May need GI consultation and sigmoidoscopy for disimpaction.  3. Sacral pressure ulcer, present on admission: Continue wound care Cleanse sacral wound with NS and pat gently dry. Apply santyl to wound bed. 1/8 inch thickness (opaque). Fill wound depth with NS moist 2x2, cover with 4x4 gauze and ABD/tape. Change daily and PRN soilage.  4. Elevated troponin: This is demand ischemia likely from ulcer and fecal impaction. This is not ACS. Discontinue heparin drip.  5. Essential hypertension: She is currently on the Toprol. Watch blood pressures as low this morning.  6. Dementia: Continue Namenda and Celexa.  7. Hyperlipidemia: Continue Zocor  Management plans discussed with the patient's daughter and she is in agreement.  CODE STATUS: DR  TOTAL TIME TAKING CARE OF THIS PATIENT: 30 minutes.     POSSIBLE D/C 1-2 days, DEPENDING ON  CLINICAL CONDITION.   Sundeep Destin M.D on 09/27/2015 at 11:16 AM  Between 7am to 6pm - Pager - [phone] After 6pm go to [email or web address] - password [email or web address] [facility]  [place] [facility]  Office  [phone]  CC: Primary care physician; No primary care provider on file.  Note: This dictation was prepared with Dragon dictation along with smaller phrase technology. Any transcriptional errors that result from this process are unintentional.

## 2015-09-27 NOTE — Progress Notes (Signed)
ANTICOAGULATION CONSULT NOTE - Initial Consult  Pharmacy Consult for Heparin  Indication: chest pain/ACS  Allergies  Allergen Reactions  . Penicillins     Patient Measurements: Height:  (160 cm) Weight: 130 lb (58.968 kg) IBW/kg (Calculated) : 52.4 Heparin Dosing Weight: 59 kg   Vital Signs: Temp: 98.1 F (36.7 C) (02/24 0555) Temp Source: Oral (02/24 0555) BP: 107/63 mmHg (02/24 0555) Pulse Rate: 83 (02/24 0555)  Labs:  Recent Labs  09/26/15 1232 09/26/15 1901 09/27/15 0025 09/27/15 0401  HGB 12.3  --   --   --   HCT 36.9  --   --   --   PLT 290  --   --   --   APTT  --  32  --   --   LABPROT  --  15.2*  --   --   INR  --  1.18  --   --   HEPARINUNFRC  --   --   --  0.27*  CREATININE 0.61  --   --   --   TROPONINI 0.15* 0.16* 0.16*  --     Estimated Creatinine Clearance: 48.7 mL/min (by C-G formula based on Cr of 0.61).   Medical History: Past Medical History  Diagnosis Date  . Dementia   . Alzheimer's dementia   . Hyperlipidemia   . Depression   . Hypertension   . Incontinence   . UTI (lower urinary tract infection)     Medications:  Prescriptions prior to admission  Medication Sig Dispense Refill Last Dose  . citalopram (CELEXA) 10 MG tablet Take 1 tablet by mouth daily.   09/26/2015 at Unknown time  . mirtazapine (REMERON) 15 MG tablet Take 1 tablet by mouth at bedtime.    09/25/2015 at Unknown time  . NAMENDA XR 28 MG CP24 24 hr capsule Take 1 tablet by mouth daily.     . simvastatin (ZOCOR) 40 MG tablet Take 1 tablet by mouth daily at 6 PM.      . valsartan (DIOVAN) 40 MG tablet Take 1 tablet by mouth daily.       Assessment: CrCl = 48.7 ml/min  Pharmacy consulted to dose heparin in this 80 year old female admitted with ACS/NSTEMI. No prior anticoag noted.   Goal of Therapy:  Heparin level 0.3-0.7 units/ml Monitor platelets by anticoagulation protocol: Yes   Plan:  Give 3500 units bolus x 1 Start heparin infusion at 700 units/hr   Will order baseline aptt and INR. Will draw 1st HL 8 hrs after start of drip on 2/24 @ 0400.   2/24 AM heparin level 0.27. 900 unit bolus and increase rate to 800 units/hr. Recheck in 8 hours.  Glorene Leitzke S 09/27/2015,6:11 AM

## 2015-09-27 NOTE — Consult Note (Signed)
WOC wound consult note Reason for Consult: Unstageable pressure injury to sacrum.  Incontinence Associated Skin Damage to left buttocks.  Wound type:Pressure in combination with moisture, friction and shearing. Is cared for by granddaughter at home.  LIkes to lie on her left side.  Pressure Ulcer POA: Yes Measurement:Left buttocks 2 cm x 1.5 cm x 0.1 cm  Sacrum 2 cm x 2 cm unable to visualize wound depth due to presence of slough.  Will begin enzymatic debridement.  Wound ZOX:[id number] tissue.  Drainage (amount, consistency, odor) Moderate serosanguinous drainage.  Musty odor.  Periwound:Incontinence associated breakdown to left buttock.  Family reports loose stools.  Wears brief and family has been doubling up disposable briefs, per daughter.  Educated regarding need to keep skin clean and dry, turn and reposition.  Has been applying vaseline to the open areas daily.  Dressing procedure/placement/frequency:Cleanse left buttocks wound with soap and water.  Apply barrier cream twice daily and PRN soilage.   Cleanse sacral wound with NS and pat gently dry.  Apply santyl to wound bed.  1/8 inch thickness (opaque).  Fill wound depth with NS moist 2x2, cover with 4x4 gauze and ABD/tape.  Change daily and PRN soilage. Will not follow at this time.  Please re-consult if needed.  Maple Hudson RN [name] CWON Pager [phone]

## 2015-09-27 NOTE — Progress Notes (Signed)
*  PRELIMINARY RESULTS* °Echocardiogram °2D Echocardiogram has been performed. ° °[name] °09/27/2015, 2:27 PM °

## 2015-09-27 NOTE — Consult Note (Signed)
Cardiology Consult    Patient ID: [name] MRN: [id number], DOB/AGE: 1936-06-20   Admit date: 09/26/2015 Date of Consult: 09/27/2015  Primary Physician: No primary care provider on file. Primary Cardiologist: new Requesting Provider: Q. Imogene Burn, MD  Patient Profile    80 y/o ? with a h/o HTN, HL, and dementia who was admitted 2/23 secondary to tachycardia and noted to have an elevated troponin.  Past Medical History   Past Medical History  Diagnosis Date  . Dementia   . Alzheimer's dementia   . Hyperlipidemia   . Depression   . Hypertension   . Incontinence   . UTI (lower urinary tract infection)     History reviewed. No pertinent past surgical history.   Allergies  Allergies  Allergen Reactions  . Penicillins     History of Present Illness    80 y/o ? w/o a prior cardiac hx.  She does have a h/o HTN/HL and dementia.  She has chronic diarrhea and incontinence and developed a skin tear around her rectum and buttocks.  She saw her PCP on 2/23 for evaluation of rectal pain and was noted to be tachycardic.  She was referred to the ED for further eval.  Here, she was also noted to have an elevated WBC count and elevated troponin.  She was admitted and subsequent troponins have been mildly elevated though flat (0.15   0.16  0.16  .014).  We've been asked to eval.  Pts dtr is in the room today.  Pt does not awaken for interview.  Pts dtr says that pt never c/o c/p or dyspnea prior to admission.  She requires 24 hr supervision and only ambulates short distances and always requires assistance.  Inpatient Medications    . aspirin  81 mg Oral Daily  . citalopram  10 mg Oral Daily  . collagenase   Topical Daily  . docusate sodium  100 mg Oral BID  . lactulose  30 g Oral BID  . memantine  28 mg Oral Daily  . metoprolol tartrate  12.5 mg Oral BID  . mirtazapine  15 mg Oral QHS  . polyethylene glycol  17 g Oral Daily  . simvastatin  40 mg Oral q1800  . sodium chloride flush   3 mL Intravenous Q12H    Family History    Pt has dementia and is not able to answer questions re: FH.  Family History  Problem Relation Age of Onset  . Other      no premature CAD    Social History    Pt has dementia and is not able to answer questions re: SH.  Social History   Social History  . Marital Status: Widowed    Spouse Name: N/A  . Number of Children: N/A  . Years of Education: N/A   Occupational History  . Not on file.   Social History Main Topics  . Smoking status: Never Smoker   . Smokeless tobacco: Not on file  . Alcohol Use: No  . Drug Use: Not on file  . Sexual Activity: Not on file   Other Topics Concern  . Not on file   Social History Narrative   Lives at home.  Granddaughter spend the day with her while her dtr spends the night with her.  She req 24 hrs supervision.  Ambulates only with assistance and with that, only short distances.  Family has purchased a wheelchair for her.     Review of Systems  Pt has dementia and is not able to answer questions re: ROS.  Physical Exam    Blood pressure 116/67, pulse 93, temperature 98.3 F (36.8 C), temperature source Oral, resp. rate 20, height 5\' 3"  (1.6 m), weight 130 lb (58.968 kg), SpO2 98 %.  General: Pleasant, NAD Psych: Sleeping currently.  Opens eyes but does not respond otw to questions. Neuro: Alert and oriented X 3. Moves all extremities spontaneously. HEENT: Normal  Neck: Supple without bruits or JVD. Lungs:  Resp regular and unlabored, CTA. Heart: Irreg no s3, s4, or murmurs. Abdomen: Soft, non-tender, non-distended, BS + x 4.  Extremities: No clubbing, cyanosis or edema. DP/PT/Radials 2+ and equal bilaterally.  Labs     Recent Labs  09/26/15 1232 09/26/15 1901 09/27/15 0025 09/27/15 0647  TROPONINI 0.15* 0.16* 0.16* 0.14*   Lab Results  Component Value Date   WBC 6.2 09/27/2015   HGB 9.5* 09/27/2015   HCT 28.5* 09/27/2015   MCV 92.9 09/27/2015   PLT 169 09/27/2015      Recent Labs Lab 09/26/15 1232 09/27/15 0647  NA 146* 148*  K 4.2 3.1*  CL 110 118*  CO2 22 25  BUN 20 15  CREATININE 0.61 0.60  CALCIUM 9.8 8.9  PROT 6.8  --   BILITOT 1.2  --   ALKPHOS 68  --   ALT 13*  --   AST 42*  --   GLUCOSE 129* 76     Radiology Studies    Ct Abdomen Pelvis W Contrast  09/26/2015  CLINICAL DATA:  Abdominal pain EXAM: CT ABDOMEN AND PELVIS WITH CONTRAST TECHNIQUE: Multidetector CT imaging of the abdomen and pelvis was performed using the standard protocol following bolus administration of intravenous contrast. CONTRAST:  80mL OMNIPAQUE IOHEXOL 300 MG/ML  SOLN COMPARISON:  None. FINDINGS: Lung bases are unremarkable. Sagittal images of the spine shows diffuse osteopenia. There is mild about 4 mm anterolisthesis L5 on S1 vertebral body. Heart size within normal limits.  Mild levoscoliosis lumbar spine. Enhanced liver shows no focal mass. There is mild intrahepatic biliary ductal dilatation. There gallbladder is not identified probable surgically absent. No aortic aneurysm. Mild atherosclerotic calcifications of distal abdominal aorta. The pancreas, spleen and adrenal glands are unremarkable. Kidneys are symmetrical in size and enhancement. No hydronephrosis or hydroureter. Delayed renal images shows bilateral renal symmetrical excretion. Bilateral visualized proximal ureter is unremarkable. Tiny left renal cysts are noted the largest measures 6.5 mm. There is no small bowel obstruction.  No ascites or free air. Moderate stool noted within cecum and right colon. Some colonic gas noted within transverse colon. Few colonic diverticula are noted descending colon. No evidence of colitis or diverticulitis. Multiple colonic diverticula are noted sigmoid colon. There is no evidence of acute diverticulitis. There is significant distension of the rectum with abundant stool measures at least 8.5 cm in diameter. This is consistent with fecal impaction. Axial image 66 there is a  skin defect and probable decubitus wound in left lower gluteal region adjacent to the midline please see axial image 69. There is air within subcutaneous wound measures about 1.9 cm. There is no evidence of air-fluid level to suggest an abscess. Some adjacent skin is thickening is noted probable mild cellulitis. No destructive bony lesions are noted within pelvis. Degenerative changes bilateral SI joints. The urinary bladder is decompressed. IMPRESSION: 1. No hydronephrosis or hydroureter. Small left renal cysts are noted. 2. No small bowel obstruction.  No ascites or free air. 3. Moderate stool noted in  right colon and proximal transverse colon. Significant distension of the rectum with abundant stool up to 8.5 cm. Findings highly suspicious for distal fecal impaction. 4. Axial image 66 there is a skin defect and probable decubitus wound in left lower gluteal region adjacent to the midline please see axial image 69. There is air within subcutaneous wound measures about 1.9 cm. There is no evidence of air-fluid level to suggest an abscess. Some adjacent skin is thickening is noted probable mild cellulitis. 5. Osteopenia and mild degenerative changes lumbar spine. Mild degenerative changes bilateral SI joints. Electronically Signed   By: Natasha Mead M.D.   On: 09/26/2015 16:34   Dg Chest Portable 1 View  09/26/2015  CLINICAL DATA:  Tachycardia. EXAM: PORTABLE CHEST 1 VIEW COMPARISON:  02/03/2005 FINDINGS: Lungs are clear without airspace disease or pulmonary edema. There appears to be a large skin fold overlying the left chest. Patient is mildly rotated towards the right side. Heart and mediastinum are within normal limits. Negative for pneumothorax. IMPRESSION: No acute chest findings. Electronically Signed   By: Richarda Overlie M.D.   On: 09/26/2015 13:59    ECG & Cardiac Imaging    Sinus tach, 130, left axis, no acute st/t changes.  Assessment & Plan    1.  Elevated troponin:  Pt was admitted 2/23 secondary  to tachycardia that was noted by her PCP in the setting of evaluation for sacral decubitus.  She was found to have an elevated troponin in the ED with a flat trend (0.15   0.16  0.16  .014).  Per her dtr, she has no h/o chest pain or dyspnea.  Suspect troponin elevation represents demand ischemia in the setting of prolonged tachycardia (unknown duration).  Cause of tachycardia unclear, as it does appear to be sinus - poor baseline 2/2 resting tremor - repeat pending.  Check d dimer and echo.  Pts dtr is not interested in aggressive w/u.  Cont heparin for now with low threshold to pursue PE w/u if d dimer markedly elevated, given sedentary lifestyle.  2.  Tachycardia:  ER ECG reviewed.  I suspect that this is sinus though there is a lot of baseline artifact.  Tele suggest accelerated junctional rhythm with inverted P waves.  Lead placement checked and correct.  Repeat 12 lead this AM.  Of note, HR appears to have improved w/ IVF and low dose bb - ? Dehydration in the setting of chronic diarrhea (though BUN/Creat nl on admission).  3.  Essential HTN:  Stable.  Signed, Nicolasa Ducking, NP 09/27/2015, 11:26 AM

## 2015-09-28 ENCOUNTER — Observation Stay: Payer: Medicare Other

## 2015-09-28 DIAGNOSIS — R000 Tachycardia, unspecified: Secondary | ICD-10-CM | POA: Diagnosis not present

## 2015-09-28 DIAGNOSIS — E785 Hyperlipidemia, unspecified: Secondary | ICD-10-CM | POA: Diagnosis not present

## 2015-09-28 DIAGNOSIS — I10 Essential (primary) hypertension: Secondary | ICD-10-CM | POA: Diagnosis not present

## 2015-09-28 DIAGNOSIS — K5900 Constipation, unspecified: Secondary | ICD-10-CM | POA: Diagnosis not present

## 2015-09-28 LAB — BASIC METABOLIC PANEL
ANION GAP: 6 (ref 5–15)
BUN: 8 mg/dL (ref 6–20)
CALCIUM: 8.9 mg/dL (ref 8.9–10.3)
CO2: 23 mmol/L (ref 22–32)
CREATININE: 0.57 mg/dL (ref 0.44–1.00)
Chloride: 114 mmol/L — ABNORMAL HIGH (ref 101–111)
GFR calc Af Amer: >60 mL/min (ref >60)
GLUCOSE: 116 mg/dL — AB (ref 65–99)
Potassium: 2.9 mmol/L — CL (ref 3.5–5.1)
Sodium: 143 mmol/L (ref 135–145)

## 2015-09-28 MED ORDER — POLYETHYLENE GLYCOL 3350 17 G PO PACK: 17.0000 g | PACK | Freq: Every day | ORAL | Status: AC

## 2015-09-28 MED ORDER — COLLAGENASE 250 UNIT/GM EX OINT: TOPICAL_OINTMENT | Freq: Every day | CUTANEOUS | Status: AC

## 2015-09-28 MED ORDER — POTASSIUM CHLORIDE 10 MEQ/100ML IV SOLN
10.0000 meq | INTRAVENOUS | Status: AC
  Administered 2015-09-28 (×4): 10 meq via INTRAVENOUS
  Filled 2015-09-28 (×4): qty 100

## 2015-09-28 MED ORDER — POTASSIUM CHLORIDE CRYS ER 20 MEQ PO TBCR
40.0000 meq | EXTENDED_RELEASE_TABLET | Freq: Once | ORAL | Status: AC
  Administered 2015-09-28: 40 meq via ORAL
  Filled 2015-09-28: qty 2

## 2015-09-28 MED ORDER — DOCUSATE SODIUM 100 MG PO CAPS: 100.0000 mg | ORAL_CAPSULE | Freq: Two times a day (BID) | ORAL | Status: AC

## 2015-09-28 NOTE — Discharge Summary (Signed)
[facility] [place] [facility]   PATIENT NAME: [name]    MR#:  [id number]  DATE OF BIRTH:  [birth year]  DATE OF ADMISSION:  09/26/2015 ADMITTING PHYSICIAN: Shaune Pollack, MD  DATE OF DISCHARGE: 09/28/2015  PRIMARY CARE PHYSICIAN: Singh,[name], MD    ADMISSION DIAGNOSIS:  Tachycardia [R00.0] Elevated troponin [R79.89]  DISCHARGE DIAGNOSIS:  Principal Problem:   Elevated troponin Active Problems:   Pressure ulcer   Sinus tachycardia (HCC)   Leukocytosis   Sacral decubitus ulcer   SECONDARY DIAGNOSIS:   Past Medical History  Diagnosis Date  . Dementia   . Alzheimer's dementia   . Hyperlipidemia   . Depression   . Hypertension   . Incontinence   . UTI (lower urinary tract infection)     HOSPITAL COURSE:    80 year old female with dementia who presented with tachycardia.  1. Sinus tachycardia: Heart rates are have improved.  2. Distal fecal impaction: CT scan is consistent with significant distention of the rectum with abundant stool up to 8 cm.  She had a large bowel movement. She will need laxatives at discharge.   3. Sacral pressure ulcer, present on admission: Continue wound care Cleanse sacral wound with NS and pat gently dry. Apply santyl to wound bed. 1/8 inch thickness (opaque). Fill wound depth with NS moist 2x2, cover with 4x4 gauze and ABD/tape. Change daily and PRN soilage.  4. Elevated troponin: This is demand ischemia likely from ulcer and fecal impaction. This is not ACS. Discontinue heparin drip. Echocardiogram shows normal ejection fraction without major valvular abnormalities. Appreciate cardiology consultation. 5. Essential hypertension: She will resume her outpatient occasions..  6. Dementia: Continue Namenda and Celexa.  7. Hyperlipidemia: Continue Zocor  8. Hypokalemia: This is repleted prior to discharge DISCHARGE CONDITIONS AND DIET:  Patient is stable for discharge with home health on a regular  diet  CONSULTS OBTAINED:     DRUG ALLERGIES:   Allergies  Allergen Reactions  . Penicillins     DISCHARGE MEDICATIONS:   Current Discharge Medication List    START taking these medications   Details  collagenase (SANTYL) ointment Apply topically daily. Qty: 15 g, Refills: 0    docusate sodium (COLACE) 100 MG capsule Take 1 capsule (100 mg total) by mouth 2 (two) times daily. Qty: 10 capsule, Refills: 0    polyethylene glycol (MIRALAX / GLYCOLAX) packet Take 17 g by mouth daily. Qty: 14 each, Refills: 0      CONTINUE these medications which have NOT CHANGED   Details  citalopram (CELEXA) 10 MG tablet Take 1 tablet by mouth daily.    mirtazapine (REMERON) 15 MG tablet Take 1 tablet by mouth at bedtime.     NAMENDA XR 28 MG CP24 24 hr capsule Take 1 tablet by mouth daily.    simvastatin (ZOCOR) 40 MG tablet Take 1 tablet by mouth daily at 6 PM.     valsartan (DIOVAN) 40 MG tablet Take 1 tablet by mouth daily.              Today   CHIEF COMPLAINT:  Patient pleasantly demented family at bedside   VITAL SIGNS:  Blood pressure 127/69, pulse 112, temperature 97.4 F (36.3 C), temperature source Oral, resp. rate 16, height 5\' 3"  (1.6 m), weight 58.968 kg (130 lb), SpO2 98 %.   REVIEW OF SYSTEMS:  Review of Systems  Unable to perform ROS: acuity of condition     PHYSICAL EXAMINATION:  GENERAL:  80 y.o.-year-old  patient lying in the bed with no acute distress.  NECK:  Supple, no jugular venous distention. No thyroid enlargement, no tenderness.  LUNGS: Normal breath sounds bilaterally, no wheezing, rales,rhonchi  No use of accessory muscles of respiration.  CARDIOVASCULAR: S1, S2 normal. No murmurs, rubs, or gallops.  ABDOMEN: Soft, non-tender, non-distended. Bowel sounds present. No organomegaly or mass.  EXTREMITIES: No pedal edema, cyanosis, or clubbing.  PSYCHIATRIC: The patient is alert  SKIN: Sacral ulcer  DATA REVIEW:   CBC  Recent Labs Lab  09/27/15 0647  WBC 6.2  HGB 9.5*  HCT 28.5*  PLT 169    Chemistries   Recent Labs Lab 09/26/15 1232 09/26/15 1901  09/28/15 0736  NA 146*  --   < > 143  K 4.2  --   < > 2.9*  CL 110  --   < > 114*  CO2 22  --   < > 23  GLUCOSE 129*  --   < > 116*  BUN 20  --   < > 8  CREATININE 0.61  --   < > 0.57  CALCIUM 9.8  --   < > 8.9  MG  --  1.9  --   --   AST 42*  --   --   --   ALT 13*  --   --   --   ALKPHOS 68  --   --   --   BILITOT 1.2  --   --   --   < > = values in this interval not displayed.  Cardiac Enzymes  Recent Labs Lab 09/26/15 1901 09/27/15 0025 09/27/15 0647  TROPONINI 0.16* 0.16* 0.14*    Microbiology Results  @  RADIOLOGY:  Dg Abd 1 View  09/28/2015  CLINICAL DATA:  Recent onset constipation. EXAM: ABDOMEN - 1 VIEW COMPARISON:  CT scan September 26, 2015 FINDINGS: A large stool ball is again seen in the rectum. There is increasing distention of the colon. The cecum measures 9.6 cm. No small bowel dilatation identified. Evaluation for free air is limited on supine imaging but none is seen. No other acute abnormalities. IMPRESSION: 1. Large stool ball in the rectum. 2. Increasing gaseous distention of the colon consistent with a relative obstruction from the distal stool ball. No small bowel dilatation at this time. Electronically Signed   By: Gerome Sam III M.D   On: 09/28/2015 09:36   Ct Abdomen Pelvis W Contrast  09/26/2015  CLINICAL DATA:  Abdominal pain EXAM: CT ABDOMEN AND PELVIS WITH CONTRAST TECHNIQUE: Multidetector CT imaging of the abdomen and pelvis was performed using the standard protocol following bolus administration of intravenous contrast. CONTRAST:  80mL OMNIPAQUE IOHEXOL 300 MG/ML  SOLN COMPARISON:  None. FINDINGS: Lung bases are unremarkable. Sagittal images of the spine shows diffuse osteopenia. There is mild about 4 mm anterolisthesis L5 on S1 vertebral body. Heart size within normal limits.  Mild levoscoliosis lumbar  spine. Enhanced liver shows no focal mass. There is mild intrahepatic biliary ductal dilatation. There gallbladder is not identified probable surgically absent. No aortic aneurysm. Mild atherosclerotic calcifications of distal abdominal aorta. The pancreas, spleen and adrenal glands are unremarkable. Kidneys are symmetrical in size and enhancement. No hydronephrosis or hydroureter. Delayed renal images shows bilateral renal symmetrical excretion. Bilateral visualized proximal ureter is unremarkable. Tiny left renal cysts are noted the largest measures 6.5 mm. There is no small bowel obstruction.  No ascites or free air. Moderate stool noted within cecum and right  colon. Some colonic gas noted within transverse colon. Few colonic diverticula are noted descending colon. No evidence of colitis or diverticulitis. Multiple colonic diverticula are noted sigmoid colon. There is no evidence of acute diverticulitis. There is significant distension of the rectum with abundant stool measures at least 8.5 cm in diameter. This is consistent with fecal impaction. Axial image 66 there is a skin defect and probable decubitus wound in left lower gluteal region adjacent to the midline please see axial image 69. There is air within subcutaneous wound measures about 1.9 cm. There is no evidence of air-fluid level to suggest an abscess. Some adjacent skin is thickening is noted probable mild cellulitis. No destructive bony lesions are noted within pelvis. Degenerative changes bilateral SI joints. The urinary bladder is decompressed. IMPRESSION: 1. No hydronephrosis or hydroureter. Small left renal cysts are noted. 2. No small bowel obstruction.  No ascites or free air. 3. Moderate stool noted in right colon and proximal transverse colon. Significant distension of the rectum with abundant stool up to 8.5 cm. Findings highly suspicious for distal fecal impaction. 4. Axial image 66 there is a skin defect and probable decubitus wound in  left lower gluteal region adjacent to the midline please see axial image 69. There is air within subcutaneous wound measures about 1.9 cm. There is no evidence of air-fluid level to suggest an abscess. Some adjacent skin is thickening is noted probable mild cellulitis. 5. Osteopenia and mild degenerative changes lumbar spine. Mild degenerative changes bilateral SI joints. Electronically Signed   By: Natasha Mead M.D.   On: 09/26/2015 16:34   Dg Chest Portable 1 View  09/26/2015  CLINICAL DATA:  Tachycardia. EXAM: PORTABLE CHEST 1 VIEW COMPARISON:  02/03/2005 FINDINGS: Lungs are clear without airspace disease or pulmonary edema. There appears to be a large skin fold overlying the left chest. Patient is mildly rotated towards the right side. Heart and mediastinum are within normal limits. Negative for pneumothorax. IMPRESSION: No acute chest findings. Electronically Signed   By: Richarda Overlie M.D.   On: 09/26/2015 13:59      Management plans discussed with the patient's daughter and she is in agreement. Stable for discharge home  Patient should follow up with PCP in one week  CODE STATUS:     Code Status Orders        Start     Ordered   09/26/15 1826  Do not attempt resuscitation (DNR)   Continuous    Question Answer Comment  In the event of cardiac or respiratory ARREST Do not call a "code blue"   In the event of cardiac or respiratory ARREST Do not perform Intubation, CPR, defibrillation or ACLS   In the event of cardiac or respiratory ARREST Use medication by any route, position, wound care, and other measures to relive pain and suffering. May use oxygen, suction and manual treatment of airway obstruction as needed for comfort.      09/26/15 1825    Code Status History    Date Active Date Inactive Code Status Order ID Comments User Context   This patient has a current code status but no historical code status.    Advance Directive Documentation        Most Recent Value   Type of  Advance Directive  Healthcare Power of Attorney, Living will   Pre-existing out of facility DNR order (yellow form or pink MOST form)     "MOST" Form in Place?  TOTAL TIME TAKING CARE OF THIS PATIENT: 35 minutes.    Note: This dictation was prepared with Dragon dictation along with smaller phrase technology. Any transcriptional errors that result from this process are unintentional.  Estephani Popper M.D on 09/28/2015 at 10:41 AM  Between 7am to 6pm - Pager - [phone] After 6pm go to [email or web address] - password [email or web address] [facility]  [facility]  Office  [phone]  CC: Primary care physician; [name], MD

## 2015-09-28 NOTE — Care Management Note (Signed)
Case Management Note  Patient Details  Name: [name] MRN: [id number] Date of Birth: 02/06/1936  Subjective/Objective:  Discussed discharge planning with [name] daughter [name] with whom she resides. Daughter chose [facility] and has phone number to call [facility] if [facility] has not contacted her by Monday. [name] has severe Alzheimers and is unable to participate in discharge planning. A referral was called and faxed to [facility] requesting home health PT, RN, AID, and wound care instructions were also faxed.                   Action/Plan:   Expected Discharge Date:                  Expected Discharge Plan:     In-House Referral:     Discharge planning Services     Post Acute Care Choice:    Choice offered to:     DME Arranged:    DME Agency:     HH Arranged:    HH Agency:     Status of Service:     Medicare Important Message Given:    Date Medicare IM Given:    Medicare IM give by:    Date Additional Medicare IM Given:    Additional Medicare Important Message give by:     If discussed at Long Length of Stay Meetings, dates discussed:    Additional Comments:  [name] A, RN 09/28/2015, 10:13 AM

## 2015-09-28 NOTE — Care Management Obs Status (Signed)
MEDICARE OBSERVATION STATUS NOTIFICATION   Patient Details  Name: [name] MRN: [id number] Date of Birth: July 09, 1936   Medicare Observation Status Notification Given:  Yes    Caren Macadam, RN 09/28/2015, 11:20 AM

## 2015-09-28 NOTE — Progress Notes (Signed)
PT Cancellation Note  Patient Details Name: [name] MRN: [id number] DOB: 09/08/1935   Cancelled Treatment:    Reason Eval/Treat Not Completed: Patient's level of consciousness (Cannot awaken and was up all night per family).  Will try later today   Ivar Drape 09/28/2015, 11:08 AM   Samul Dada, PT MS Acute Rehab Dept. Number: [id number] and [facility] [phone]

## 2015-09-28 NOTE — Progress Notes (Signed)
Spoke with dr. Juliene Pina to make aware patient potassium was 2.9. Per md will place orders. Patient will still be discharged today

## 2015-09-28 NOTE — Progress Notes (Addendum)
Spoke with dr. Juliene Pina to make aware dnr form has not been signed. Dr Amado Coe is on the floor, per Dr. Juliene Pina its okay to have dr. Amado Coe sign dnr form for her. Also clarified with dr. Juliene Pina patient is okay to be discharged home. Per md patient can be discharged once potassium riders are done

## 2015-09-28 NOTE — Progress Notes (Signed)
Discharge instructions along with home medication list and follow up gone over with patients daughter. She verbalized that she understood instruction. Patients daughter is aware prescriptions were electronically submitted to [facility]. Patient to be discharged home with home health. Home health number was already given to patients daughter, told her to call the agency if she does not here from them by Monday. Patient to go home on RA, no distress noted, staff will transport patient off the floor via wheelchair

## 2015-09-29 DIAGNOSIS — R000 Tachycardia, unspecified: Secondary | ICD-10-CM | POA: Diagnosis not present

## 2015-09-29 DIAGNOSIS — L89210 Pressure ulcer of right hip, unstageable: Secondary | ICD-10-CM | POA: Diagnosis not present

## 2015-09-29 DIAGNOSIS — I10 Essential (primary) hypertension: Secondary | ICD-10-CM | POA: Diagnosis not present

## 2015-09-29 DIAGNOSIS — L89150 Pressure ulcer of sacral region, unstageable: Secondary | ICD-10-CM | POA: Diagnosis not present

## 2015-09-29 DIAGNOSIS — E785 Hyperlipidemia, unspecified: Secondary | ICD-10-CM | POA: Diagnosis not present

## 2015-09-29 DIAGNOSIS — L89620 Pressure ulcer of left heel, unstageable: Secondary | ICD-10-CM | POA: Diagnosis not present

## 2015-09-29 DIAGNOSIS — G309 Alzheimer's disease, unspecified: Secondary | ICD-10-CM | POA: Diagnosis not present

## 2015-09-30 DIAGNOSIS — I10 Essential (primary) hypertension: Secondary | ICD-10-CM | POA: Diagnosis not present

## 2015-09-30 DIAGNOSIS — L89210 Pressure ulcer of right hip, unstageable: Secondary | ICD-10-CM | POA: Diagnosis not present

## 2015-09-30 DIAGNOSIS — R000 Tachycardia, unspecified: Secondary | ICD-10-CM | POA: Diagnosis not present

## 2015-09-30 DIAGNOSIS — L89150 Pressure ulcer of sacral region, unstageable: Secondary | ICD-10-CM | POA: Diagnosis not present

## 2015-09-30 DIAGNOSIS — L89620 Pressure ulcer of left heel, unstageable: Secondary | ICD-10-CM | POA: Diagnosis not present

## 2015-09-30 DIAGNOSIS — G309 Alzheimer's disease, unspecified: Secondary | ICD-10-CM | POA: Diagnosis not present

## 2015-09-30 DIAGNOSIS — E785 Hyperlipidemia, unspecified: Secondary | ICD-10-CM | POA: Diagnosis not present

## 2015-10-01 DIAGNOSIS — L89620 Pressure ulcer of left heel, unstageable: Secondary | ICD-10-CM | POA: Diagnosis not present

## 2015-10-01 DIAGNOSIS — L89159 Pressure ulcer of sacral region, unspecified stage: Secondary | ICD-10-CM | POA: Diagnosis not present

## 2015-10-01 DIAGNOSIS — L89150 Pressure ulcer of sacral region, unstageable: Secondary | ICD-10-CM | POA: Diagnosis not present

## 2015-10-02 DIAGNOSIS — L89150 Pressure ulcer of sacral region, unstageable: Secondary | ICD-10-CM | POA: Diagnosis not present

## 2015-10-02 DIAGNOSIS — L89210 Pressure ulcer of right hip, unstageable: Secondary | ICD-10-CM | POA: Diagnosis not present

## 2015-10-02 DIAGNOSIS — R000 Tachycardia, unspecified: Secondary | ICD-10-CM | POA: Diagnosis not present

## 2015-10-02 DIAGNOSIS — E785 Hyperlipidemia, unspecified: Secondary | ICD-10-CM | POA: Diagnosis not present

## 2015-10-02 DIAGNOSIS — G309 Alzheimer's disease, unspecified: Secondary | ICD-10-CM | POA: Diagnosis not present

## 2015-10-02 DIAGNOSIS — L89620 Pressure ulcer of left heel, unstageable: Secondary | ICD-10-CM | POA: Diagnosis not present

## 2015-10-02 DIAGNOSIS — I10 Essential (primary) hypertension: Secondary | ICD-10-CM | POA: Diagnosis not present

## 2015-10-04 DIAGNOSIS — G309 Alzheimer's disease, unspecified: Secondary | ICD-10-CM | POA: Diagnosis not present

## 2015-10-04 DIAGNOSIS — E785 Hyperlipidemia, unspecified: Secondary | ICD-10-CM | POA: Diagnosis not present

## 2015-10-04 DIAGNOSIS — L89150 Pressure ulcer of sacral region, unstageable: Secondary | ICD-10-CM | POA: Diagnosis not present

## 2015-10-04 DIAGNOSIS — L89620 Pressure ulcer of left heel, unstageable: Secondary | ICD-10-CM | POA: Diagnosis not present

## 2015-10-04 DIAGNOSIS — R000 Tachycardia, unspecified: Secondary | ICD-10-CM | POA: Diagnosis not present

## 2015-10-04 DIAGNOSIS — I10 Essential (primary) hypertension: Secondary | ICD-10-CM | POA: Diagnosis not present

## 2015-10-04 DIAGNOSIS — L89210 Pressure ulcer of right hip, unstageable: Secondary | ICD-10-CM | POA: Diagnosis not present

## 2015-10-07 DIAGNOSIS — I10 Essential (primary) hypertension: Secondary | ICD-10-CM | POA: Diagnosis not present

## 2015-10-07 DIAGNOSIS — L89159 Pressure ulcer of sacral region, unspecified stage: Secondary | ICD-10-CM | POA: Diagnosis not present

## 2015-10-07 DIAGNOSIS — G301 Alzheimer's disease with late onset: Secondary | ICD-10-CM | POA: Diagnosis not present

## 2015-10-08 DIAGNOSIS — G309 Alzheimer's disease, unspecified: Secondary | ICD-10-CM | POA: Diagnosis not present

## 2015-10-08 DIAGNOSIS — L89150 Pressure ulcer of sacral region, unstageable: Secondary | ICD-10-CM | POA: Diagnosis not present

## 2015-10-08 DIAGNOSIS — L89620 Pressure ulcer of left heel, unstageable: Secondary | ICD-10-CM | POA: Diagnosis not present

## 2015-10-08 DIAGNOSIS — L89210 Pressure ulcer of right hip, unstageable: Secondary | ICD-10-CM | POA: Diagnosis not present

## 2015-10-08 DIAGNOSIS — E785 Hyperlipidemia, unspecified: Secondary | ICD-10-CM | POA: Diagnosis not present

## 2015-10-08 DIAGNOSIS — R000 Tachycardia, unspecified: Secondary | ICD-10-CM | POA: Diagnosis not present

## 2015-10-08 DIAGNOSIS — I10 Essential (primary) hypertension: Secondary | ICD-10-CM | POA: Diagnosis not present

## 2015-10-10 DIAGNOSIS — G309 Alzheimer's disease, unspecified: Secondary | ICD-10-CM | POA: Diagnosis not present

## 2015-10-10 DIAGNOSIS — E785 Hyperlipidemia, unspecified: Secondary | ICD-10-CM | POA: Diagnosis not present

## 2015-10-10 DIAGNOSIS — I10 Essential (primary) hypertension: Secondary | ICD-10-CM | POA: Diagnosis not present

## 2015-10-10 DIAGNOSIS — R000 Tachycardia, unspecified: Secondary | ICD-10-CM | POA: Diagnosis not present

## 2015-10-10 DIAGNOSIS — L89150 Pressure ulcer of sacral region, unstageable: Secondary | ICD-10-CM | POA: Diagnosis not present

## 2015-10-10 DIAGNOSIS — L89210 Pressure ulcer of right hip, unstageable: Secondary | ICD-10-CM | POA: Diagnosis not present

## 2015-10-10 DIAGNOSIS — L89620 Pressure ulcer of left heel, unstageable: Secondary | ICD-10-CM | POA: Diagnosis not present

## 2015-10-14 DIAGNOSIS — R000 Tachycardia, unspecified: Secondary | ICD-10-CM | POA: Diagnosis not present

## 2015-10-14 DIAGNOSIS — L89210 Pressure ulcer of right hip, unstageable: Secondary | ICD-10-CM | POA: Diagnosis not present

## 2015-10-14 DIAGNOSIS — L89150 Pressure ulcer of sacral region, unstageable: Secondary | ICD-10-CM | POA: Diagnosis not present

## 2015-10-14 DIAGNOSIS — L89620 Pressure ulcer of left heel, unstageable: Secondary | ICD-10-CM | POA: Diagnosis not present

## 2015-10-14 DIAGNOSIS — G309 Alzheimer's disease, unspecified: Secondary | ICD-10-CM | POA: Diagnosis not present

## 2015-10-14 DIAGNOSIS — I10 Essential (primary) hypertension: Secondary | ICD-10-CM | POA: Diagnosis not present

## 2015-10-14 DIAGNOSIS — E785 Hyperlipidemia, unspecified: Secondary | ICD-10-CM | POA: Diagnosis not present

## 2015-10-15 ENCOUNTER — Encounter: Payer: Medicare Other | Attending: Internal Medicine | Admitting: Internal Medicine

## 2015-10-15 DIAGNOSIS — Z880 Allergy status to penicillin: Secondary | ICD-10-CM | POA: Insufficient documentation

## 2015-10-15 DIAGNOSIS — I252 Old myocardial infarction: Secondary | ICD-10-CM | POA: Insufficient documentation

## 2015-10-15 DIAGNOSIS — G309 Alzheimer's disease, unspecified: Secondary | ICD-10-CM | POA: Insufficient documentation

## 2015-10-15 DIAGNOSIS — L89212 Pressure ulcer of right hip, stage 2: Secondary | ICD-10-CM | POA: Diagnosis not present

## 2015-10-15 DIAGNOSIS — Z87891 Personal history of nicotine dependence: Secondary | ICD-10-CM | POA: Diagnosis not present

## 2015-10-15 DIAGNOSIS — F0280 Dementia in other diseases classified elsewhere without behavioral disturbance: Secondary | ICD-10-CM | POA: Diagnosis not present

## 2015-10-15 DIAGNOSIS — M1990 Unspecified osteoarthritis, unspecified site: Secondary | ICD-10-CM | POA: Diagnosis not present

## 2015-10-15 DIAGNOSIS — D649 Anemia, unspecified: Secondary | ICD-10-CM | POA: Insufficient documentation

## 2015-10-15 DIAGNOSIS — L89154 Pressure ulcer of sacral region, stage 4: Secondary | ICD-10-CM | POA: Insufficient documentation

## 2015-10-15 DIAGNOSIS — L03317 Cellulitis of buttock: Secondary | ICD-10-CM | POA: Insufficient documentation

## 2015-10-15 DIAGNOSIS — I10 Essential (primary) hypertension: Secondary | ICD-10-CM | POA: Insufficient documentation

## 2015-10-15 DIAGNOSIS — L89213 Pressure ulcer of right hip, stage 3: Secondary | ICD-10-CM | POA: Diagnosis not present

## 2015-10-15 DIAGNOSIS — E43 Unspecified severe protein-calorie malnutrition: Secondary | ICD-10-CM | POA: Insufficient documentation

## 2015-10-16 ENCOUNTER — Other Ambulatory Visit
Admission: RE | Admit: 2015-10-16 | Discharge: 2015-10-16 | Disposition: A | Discharge status: Home / Self Care | Payer: Medicare Other | Source: Ambulatory Visit | Attending: Internal Medicine | Admitting: Internal Medicine

## 2015-10-16 DIAGNOSIS — L89620 Pressure ulcer of left heel, unstageable: Secondary | ICD-10-CM | POA: Diagnosis not present

## 2015-10-16 DIAGNOSIS — G309 Alzheimer's disease, unspecified: Secondary | ICD-10-CM | POA: Diagnosis not present

## 2015-10-16 DIAGNOSIS — B999 Unspecified infectious disease: Secondary | ICD-10-CM | POA: Diagnosis not present

## 2015-10-16 DIAGNOSIS — L89210 Pressure ulcer of right hip, unstageable: Secondary | ICD-10-CM | POA: Diagnosis not present

## 2015-10-16 DIAGNOSIS — R000 Tachycardia, unspecified: Secondary | ICD-10-CM | POA: Diagnosis not present

## 2015-10-16 DIAGNOSIS — I10 Essential (primary) hypertension: Secondary | ICD-10-CM | POA: Diagnosis not present

## 2015-10-16 DIAGNOSIS — L89150 Pressure ulcer of sacral region, unstageable: Secondary | ICD-10-CM | POA: Diagnosis not present

## 2015-10-16 DIAGNOSIS — E785 Hyperlipidemia, unspecified: Secondary | ICD-10-CM | POA: Diagnosis not present

## 2015-10-16 NOTE — Progress Notes (Signed)
[name] ([id number]) Visit Report for 10/15/2015 Chief Complaint Document Details Patient Name: [name], [name] Date of Service: 10/15/2015 8:45 AM Medical Record Patient Account Number: [id number] Number: Treating RN: [name], RN, [name], [name] 09/22/1935 [phone] y.o. Other Clinician: Date of Birth/Sex: Female) Treating ROBSON, [name] Primary Care Physician/Extender: [name], [name] Physician: Referring Physician: [name] Weeks in Treatment: 0 Information Obtained from: Patient Chief Complaint The patient is here for a sacral decubitus ulcer also a more superficial wound over her right hip Electronic Signature(s) Signed: 10/16/2015 8:13:25 AM By: Baltazar Najjar MD Entered By: Baltazar Najjar on 10/15/2015 11:56:27 Sutter, [name] ([id number]) -------------------------------------------------------------------------------- Debridement Details Patient Name: Dahms, [name] Date of Service: 10/15/2015 8:45 AM Medical Record Patient Account Number: [id number] Number: Treating RN: [name], RN, [name], [name] [birth year] [phone] y.o. Other Clinician: Date of Birth/Sex: Female) Treating ROBSON, [name] Primary Care Physician/Extender: [name], [name] Physician: Referring Physician: [name] Weeks in Treatment: 0 Debridement Performed for Wound #1 Sacrum Assessment: Performed By: Physician Maxwell Caul, MD Debridement: Debridement Pre-procedure Yes Verification/Time Out Taken: Start Time: 09:50 Pain Control: Lidocaine 4% Topical Solution Level: Skin/Subcutaneous Tissue/Muscle/Bone Total Area Debrided (L x 6 (cm) x 3.2 (cm) = 19.2 (cm) W): Tissue and other Non-Viable, Eschar, Exudate, Fibrin/Slough, Subcutaneous material debrided: Instrument: Blade, Forceps Bleeding: Moderate Hemostasis Achieved: Pressure End Time: 10:00 Procedural Pain: Insensate Post Procedural Pain: Insensate Response to Treatment: Procedure was tolerated  well Post Debridement Measurements of Total Wound Length: (cm) 6 Stage: Category/Stage IV Width: (cm) 3.2 Depth: (cm) 4.5 Volume: (cm) 67.858 Post Procedure Diagnosis Same as Pre-procedure Electronic Signature(s) Signed: 10/15/2015 3:54:41 PM By: Elpidio Eric [name], RN Signed: 10/16/2015 8:13:25 AM By: Baltazar Najjar MD Entered By: Elpidio Eric on 10/15/2015 10:02:26 Decarlo, [name] ([id number]) Blagg, [name] ([id number]) -------------------------------------------------------------------------------- Debridement Details Patient Name: Koontz, [name] Date of Service: 10/15/2015 8:45 AM Medical Record Patient Account Number: [id number] Number: Treating RN: [name], RN, [name], [name] [birth year] [phone] y.o. Other Clinician: Date of Birth/Sex: Female) Treating ROBSON, [name] Primary Care Physician/Extender: [name], [name] Physician: Referring Physician: [name] Weeks in Treatment: 0 Debridement Performed for Wound #2 Right Ischial Tuberosity Assessment: Performed By: Physician Maxwell Caul, MD Debridement: Debridement Pre-procedure Yes Verification/Time Out Taken: Start Time: 10:00 Pain Control: Lidocaine 4% Topical Solution Level: Skin/Subcutaneous Tissue/Muscle/Bone Total Area Debrided (L x 1 (cm) x 1 (cm) = 1 (cm) W): Tissue and other Non-Viable, Eschar, Exudate, Fibrin/Slough, Subcutaneous material debrided: Instrument: Curette Bleeding: Moderate Hemostasis Achieved: Pressure End Time: 10:03 Procedural Pain: Insensate Post Procedural Pain: Insensate Response to Treatment: Procedure was tolerated well Post Debridement Measurements of Total Wound Length: (cm) 1 Stage: Category/Stage II Width: (cm) 1 Depth: (cm) 0.2 Volume: (cm) 0.157 Post Procedure Diagnosis Same as Pre-procedure Electronic Signature(s) Signed: 10/15/2015 3:54:41 PM By: Elpidio Eric [name], RN Signed: 10/16/2015 8:13:25 AM By: Baltazar Najjar MD Entered By: Elpidio Eric on  10/15/2015 10:03:05 Ullom, [name] ([id number]) Fausto, [name] ([id number]) -------------------------------------------------------------------------------- HPI Details Patient Name: Velardi, [name] Date of Service: 10/15/2015 8:45 AM Medical Record Patient Account Number: [id number] Number: Treating RN: [name], RN, [name], [name] 1936/07/04 [phone] y.o. Other Clinician: Date of Birth/Sex: Female) Treating ROBSON, [name] Primary Care Physician/Extender: [name], [name] Physician: Referring Physician: [name] Weeks in Treatment: 0 History of Present Illness HPI Description: 10/14/16; this is a very frail lady with advanced Alzheimer's disease who is being cared for aerobically at home by her daughter and granddaughter. She is essentially nonambulatory and seems to  be minimally verbal. They tell me that she started to develop a pressure area in her sacral area in January. Initially a very small open area that progressed up. There is also a more recent area on the right hip. The patient was seen in her primary office for this problem on 2/23 at that point noted to have a stage III wound with tunneling over the sacrum. There were 2 other areas noted on the upper left and right bilateral occipital. As well as a stage II on the right trochanter. She was referred to the ER because of tachycardia. She was hospitalized from 2/23 through 2/25. The admission this seems to open dominated by a distal fecal impaction. She was noted to have sinus tachycardia which improved. She did have a CT scan of the abdomen and pelvis during the hospitalization. In addition to the fecal impaction this noted the ulcer in the left lower gluteal region adjacent to the midline. There was air in the subcutaneous wound but no evidence of an air-fluid level to suggest an abscess. There was no comment about osteomyelitis. Since her return home she has not been eating well but is drinking. The family  provides total care. I note that her albumin was 3 on admission to the hospital. Sodium got as high as 148 but was normalized by the time she left Electronic Signature(s) Signed: 10/16/2015 8:13:25 AM By: Baltazar Najjar MD Entered By: Baltazar Najjar on 10/15/2015 12:22:50 Quinley, [name] ([id number]) -------------------------------------------------------------------------------- Physical Exam Details Patient Name: Davies, Jaselyn B. Date of Service: 10/15/2015 8:45 AM Medical Record Patient Account Number: [id number] Number: Treating RN: [name], RN, [name], [name] April 10, 1936 [phone] y.o. Other Clinician: Date of Birth/Sex: Female) Treating ROBSON, [name] Primary Care Physician/Extender: [name], [name] Physician: Referring Physician: Thedore Mins, [name] Weeks in Treatment: 0 Constitutional Pulse regular and within target range for patient.Marland Kitchen Respirations regular, non-labored and within target range.. Temperature is normal and within the target range for the patient.. Somewhat gaunt appearing elderly woman. Not very responsive during this clinic visit.. Neck Neck supple and symmetrical. No masses or crepitus. Respiratory Respiratory effort is easy and symmetric bilaterally. Rate is normal at rest and on room air.. Bilateral breath sounds are clear and equal in all lobes with no wheezes, rales or rhonchi.. Cardiovascular Heart rhythm and rate regular, without murmur or gallop. She did not appear dehydrated. Gastrointestinal (GI) Abdomen is soft and non-distended without masses or tenderness. Bowel sounds active in all quadrants.. No liver or spleen enlargement or tenderness.. Lymphatic There were no nodes in the cervical clavicular or axillary areas. Integumentary (Hair, Skin) Other than wounds there were no other skin lesions seen. Notes Additional examination points, the patient did not appear significantly dehydrated. I would certainly wonder about her nutritional status even  given the albumin of 3. She was nonverbal and minimally arousable during most of my exam although the family states she will wake up and talk about medication Wound Examination patient has an extensive stage IV wound over her lower sacral are mostly to the left but then tunneling towards the lower sacrum she has a smaller more superficial wound just under the right greater trochanter will do these areas were extensively debridement I suspect this sacral wound has coexistent infection quite possibly osteomyelitis there is also soft tissue tenderness no subcutaneous crepitus i felt. Debridement of necrotic tissue likely fat, subcutaneous tissue and surface bone was removed Electronic Signature(s) Signed: 10/16/2015 8:13:25 AM By: Baltazar Najjar MD Entered By: Baltazar Najjar on 10/15/2015 12:54:01  Blahnik, [name] ([id number]) -------------------------------------------------------------------------------- Physician Orders Details Patient Name: Landsberg, Yvana B. Date of Service: 10/15/2015 8:45 AM Medical Record Patient Account Number: [id number] Number: Treating RN: Clover MealyAfful, RN, [name], [name] November 24, 1935 [phone](79 y.o. Other Clinician: Date of Birth/Sex: Female) Treating ROBSON, [name] Primary Care Physician/Extender: Marlana LatusG SINGH, [name] Physician: Referring Physician: Leotis ShamesSINGH, [name] Weeks in Treatment: 0 Verbal / Phone Orders: Yes Clinician: [name], RN, [name], [name] Read Back and Verified: Yes Diagnosis Coding Wound Cleansing Wound #1 Sacrum o Clean wound with Normal Saline. Wound #2 Right Ischial Tuberosity o Clean wound with Normal Saline. Anesthetic Wound #1 Sacrum o Topical Lidocaine 4% cream applied to wound bed prior to debridement Wound #2 Right Ischial Tuberosity o Topical Lidocaine 4% cream applied to wound bed prior to debridement Skin Barriers/Peri-Wound Care Wound #1 Sacrum o Barrier cream Wound #2 Right Ischial Tuberosity o Barrier cream Primary Wound  Dressing Wound #1 Sacrum o Aquacel Ag Wound #2 Right Ischial Tuberosity o Aquacel Ag Secondary Dressing Wound #1 Sacrum o Boardered Foam Dressing Wound #2 Right Ischial Tuberosity Yebra, Montine B. ([id number]) o Boardered Foam Dressing Dressing Change Frequency Wound #1 Sacrum o Change dressing every other day. - may change daily as needed for excess drainage Wound #2 Right Ischial Tuberosity o Change dressing every other day. - may change daily as needed for excess drainage Follow-up Appointments Wound #1 Sacrum o Return Appointment in 1 week. Wound #2 Right Ischial Tuberosity o Return Appointment in 1 week. Off-Loading Wound #1 Sacrum o Roho cushion for wheelchair - To be ordered by Home health o Turn and reposition every 2 hours o Mattress [facility]- Hospital bed ordered by [facility] Wound #2 Right Ischial Tuberosity o Roho cushion for wheelchair - To be ordered by Home health o Turn and reposition every 2 hours o Mattress [facility]- Hospital bed ordered by [facility] Additional Orders / Instructions Wound #1 Sacrum o Increase protein intake. o Other: - Supplements: Vitamin C, MVI, Zinc Wound #2 Right Ischial Tuberosity o Increase protein intake. o Other: - Supplements: Vitamin C, MVI, Zinc Home Health Wound #1 Sacrum o Continue Home Health Visits - [facility] o Home Health Nurse may visit PRN to address patientos wound care needs. o FACE TO FACE ENCOUNTER: MEDICARE and MEDICAID PATIENTS: I certify that this patient is under my care and that I had a face-to-face encounter that meets the physician face-to-face encounter requirements with this patient on this date. The encounter with the patient was in whole or in part for the following MEDICAL CONDITION: (primary reason for Home Healthcare) MEDICAL NECESSITY: I certify, that based on my findings, NURSING services are a medically necessary home health service. HOME  BOUND STATUS: I certify that my clinical findings support that this patient is homebound (i.e., Due to illness or injury, pt requires aid of Riggenbach, Anapaula B. ([id number]) supportive devices such as crutches, cane, wheelchairs, walkers, the use of special transportation or the assistance of another person to leave their place of residence. There is a normal inability to leave the home and doing so requires considerable and taxing effort. Other absences are for medical reasons / religious services and are infrequent or of short duration when for other reasons). o If current dressing causes regression in wound condition, may D/C ordered dressing product/s and apply Normal Saline Moist Dressing daily until next Wound Healing Center / Other MD appointment. Notify Wound Healing Center of regression in wound condition at [phone]. o Please direct any NON-WOUND related issues/requests for  orders to patient's Primary Care Physician Wound #2 Right Ischial Tuberosity o Continue Home Health Visits - [facility] o Home Health Nurse may visit PRN to address patientos wound care needs. o FACE TO FACE ENCOUNTER: MEDICARE and MEDICAID PATIENTS: I certify that this patient is under my care and that I had a face-to-face encounter that meets the physician face-to-face encounter requirements with this patient on this date. The encounter with the patient was in whole or in part for the following MEDICAL CONDITION: (primary reason for Home Healthcare) MEDICAL NECESSITY: I certify, that based on my findings, NURSING services are a medically necessary home health service. HOME BOUND STATUS: I certify that my clinical findings support that this patient is homebound (i.e., Due to illness or injury, pt requires aid of supportive devices such as crutches, cane, wheelchairs, walkers, the use of special transportation or the assistance of another person to leave their place of residence. There is  a normal inability to leave the home and doing so requires considerable and taxing effort. Other absences are for medical reasons / religious services and are infrequent or of short duration when for other reasons). o If current dressing causes regression in wound condition, may D/C ordered dressing product/s and apply Normal Saline Moist Dressing daily until next Wound Healing Center / Other MD appointment. Notify Wound Healing Center of regression in wound condition at [phone]. o Please direct any NON-WOUND related issues/requests for orders to patient's Primary Care Physician Medications-please add to medication list. Wound #1 Sacrum o P.O. Antibiotics - Doxy 100mg  twice a day for 7 days o P.O. Antibiotics - Ciprofloxacin 500mg /62ml twice a day for 7 days Wound #2 Right Ischial Tuberosity o P.O. Antibiotics - Doxy 100mg  twice a day for 7 days o P.O. Antibiotics - Ciprofloxacin 500mg /97ml twice a day for 7 days Laboratory o Bacteria identified in Wound by Culture (MICRO) - Sacrum oooo LOINC Code: 6462-6 oooo Convenience Name: Wound culture routine Marrin, Arretta B. ([id number]) Notes ABT script given to family. Electronic Signature(s) Signed: 10/15/2015 3:54:41 PM By: Elpidio Eric [name], RN Signed: 10/16/2015 8:13:25 AM By: Baltazar Najjar MD Entered By: Elpidio Eric on 10/15/2015 10:17:35 Zenz, [name] ([id number]) -------------------------------------------------------------------------------- Prescription 10/15/2015 Patient Name: Balke, [name] Physician: Maxwell Caul MD Date of Birth: [birth year] NPI#: [id number] Sex: F DEA#: [id number] Phone #: [phone] License #: [id number] Patient Address: [facility] [address] [place], [place] [address] [facility] [address] [place], [place] [address] [phone] Allergies penicillin Physician's Orders P.O. Antibiotics - Doxy 100mg   twice a day for 7 days Signature(s): Date(s): Okray, ALYXANDRIA WENTZ ([id number]) Prescription 10/15/2015 Patient Name: Carrigan, CHOSEN B. Physician: Maxwell Caul MD Date of Birth: 27-Jun-1936 NPI#: [id number] Sex: F DEA#: [id number] Phone #: [phone] License #: [id number] Patient Address: [facility] [address] [place], [place] [address] [facility] [address] [place], [place] [address] [phone] Allergies penicillin Physician's Orders P.O. Antibiotics - Ciprofloxacin 500mg /28ml twice a day for 7 days Signature(s): Date(s): Electronic Signature(s) Signed: 10/15/2015 3:54:41 PM By: Elpidio Eric [name], RN Signed: 10/16/2015 8:13:25 AM By: Baltazar Najjar MD Entered By: Elpidio Eric on 10/15/2015 10:17:36 Stordahl, [name] ([id number]) --------------------------------------------------------------------------------  Problem List Details Patient Name: Decker, Ann B. Date of Service: 10/15/2015 8:45 AM Medical Record Patient Account Number: [id number] Number: Treating RN: [name], RN, [name], [name] [birth year] [phone] y.o. Other Clinician: Date of Birth/Sex: Female) Treating ROBSON,  [name] Primary Care Physician/Extender: [name], [name] Physician: Referring Physician: [name] Weeks in Treatment: 0 Active Problems ICD-10 Encounter Code Description Active Date Diagnosis L89.154 Pressure ulcer of sacral region, stage 4 10/15/2015 Yes L03.317 Cellulitis of buttock 10/15/2015 Yes E43 Unspecified severe protein-calorie malnutrition 10/15/2015 Yes L89.213 Pressure ulcer of right hip, stage 3 10/15/2015 Yes Inactive Problems Resolved Problems Electronic Signature(s) Signed: 10/16/2015 8:13:25 AM By: Baltazar Najjar MD Entered By: Baltazar Najjar on 10/15/2015 12:38:35 Kobel, [name] ([id number]) -------------------------------------------------------------------------------- Progress Note  Details Patient Name: Demarco, Tawan B. Date of Service: 10/15/2015 8:45 AM Medical Record Patient Account Number: [id number] Number: Treating RN: [name], RN, [name], [name] September 02, 1935 [phone] y.o. Other Clinician: Date of Birth/Sex: Female) Treating ROBSON, [name] Primary Care Physician/Extender: [name], [name] Physician: Referring Physician: [name] Weeks in Treatment: 0 Subjective Chief Complaint Information obtained from Patient The patient is here for a sacral decubitus ulcer also a more superficial wound over her right hip History of Present Illness (HPI) 10/14/16; this is a very frail lady with advanced Alzheimer's disease who is being cared for aerobically at home by her daughter and granddaughter. She is essentially nonambulatory and seems to be minimally verbal. They tell me that she started to develop a pressure area in her sacral area in January. Initially a very small open area that progressed up. There is also a more recent area on the right hip. The patient was seen in her primary office for this problem on 2/23 at that point noted to have a stage III wound with tunneling over the sacrum. There were 2 other areas noted on the upper left and right bilateral occipital. As well as a stage II on the right trochanter. She was referred to the ER because of tachycardia. She was hospitalized from 2/23 through 2/25. The admission this seems to open dominated by a distal fecal impaction. She was noted to have sinus tachycardia which improved. She did have a CT scan of the abdomen and pelvis during the hospitalization. In addition to the fecal impaction this noted the ulcer in the left lower gluteal region adjacent to the midline. There was air in the subcutaneous wound but no evidence of an air-fluid level to suggest an abscess. There was no comment about osteomyelitis. Since her return home she has not been eating well but is drinking. The family provides total care. I  note that her albumin was 3 on admission to the hospital. Sodium got as high as 148 but was normalized by the time she left Wound History Patient presents with 2 open wounds that have been present for approximately 17month. Patient has been treating wounds in the following manner: vaseline. Laboratory tests have not been performed in the last month. Patient reportedly has tested positive for an antibiotic resistant organism. Patient reportedly has not tested positive for osteomyelitis. Patient reportedly has not had testing performed to evaluate circulation in the legs. Patient experiences the following problems associated with their wounds: infection. Patient History Information obtained from Caregiver, Chart. Allergies penicillin Binning, MARVELYN BOUCHILLON. ([id number]) Family History Cancer - husband, Diabetes - husbad, Hypertension - husband, No family history of Heart Disease, Hereditary Spherocytosis, Kidney Disease, Lung Disease, Seizures, Stroke, Thyroid Problems, Tuberculosis. Social History Former smoker, Marital Status - Widowed, Alcohol Use - Never, Drug Use - No History, Caffeine Use - Moderate. Medical History Eyes Patient has history of Cataracts Ear/Nose/Mouth/Throat Denies history of Chronic sinus problems/congestion, Middle ear problems Hematologic/Lymphatic Patient has history of Anemia Respiratory Denies history of Aspiration,  Asthma, Chronic Obstructive Pulmonary Disease (COPD), Pneumothorax, Sleep Apnea, Tuberculosis Cardiovascular Patient has history of Arrhythmia, Hypertension, Myocardial Infarction Gastrointestinal Denies history of Cirrhosis , Colitis, Crohn s, Hepatitis A, Hepatitis B, Hepatitis C Endocrine Denies history of Type I Diabetes, Type II Diabetes Immunological Denies history of Lupus Erythematosus, Raynaud s, Scleroderma Integumentary (Skin) Patient has history of History of pressure wounds Musculoskeletal Patient has history of  Osteoarthritis Neurologic Patient has history of Dementia Oncologic Denies history of Received Chemotherapy, Received Radiation Psychiatric Denies history of Anorexia/bulimia, Confinement Anxiety Review of Systems (ROS) Constitutional Symptoms (General Health) Complains or has symptoms of Fatigue. Eyes The patient has no complaints or symptoms. Ear/Nose/Mouth/Throat The patient has no complaints or symptoms. Hematologic/Lymphatic The patient has no complaints or symptoms. Respiratory The patient has no complaints or symptoms. Cardiovascular The patient has no complaints or symptoms. Hinsch, [name] ([id number]) Gastrointestinal The patient has no complaints or symptoms. Endocrine The patient has no complaints or symptoms. Genitourinary The patient has no complaints or symptoms. Immunological The patient has no complaints or symptoms. Integumentary (Skin) Complains or has symptoms of Wounds, Breakdown. Musculoskeletal Complains or has symptoms of Muscle Weakness. Neurologic The patient has no complaints or symptoms. Oncologic The patient has no complaints or symptoms. Psychiatric The patient has no complaints or symptoms. Objective Constitutional Pulse regular and within target range for patient.Marland Kitchen Respirations regular, non-labored and within target range.. Temperature is normal and within the target range for the patient.. Somewhat gaunt appearing elderly woman. Not very responsive during this clinic visit.. Vitals Time Taken: 9:08 AM, Height: 63 in, Source: Stated, Pulse: 101 bpm, Respiratory Rate: 16 breaths/min. Neck Neck supple and symmetrical. No masses or crepitus. Respiratory Respiratory effort is easy and symmetric bilaterally. Rate is normal at rest and on room air.. Bilateral breath sounds are clear and equal in all lobes with no wheezes, rales or rhonchi.. Cardiovascular Heart rhythm and rate regular, without murmur or gallop. She did not appear  dehydrated. Gastrointestinal (GI) Abdomen is soft and non-distended without masses or tenderness. Bowel sounds active in all quadrants.. No liver or spleen enlargement or tenderness.. Lymphatic Valadez, Demiah B. ([id number]) There were no nodes in the cervical clavicular or axillary areas. General Notes: Additional examination points, the patient did not appear significantly dehydrated. I would certainly wonder about her nutritional status even given the albumin of 3. She was nonverbal and minimally arousable during most of my exam although the family states she will wake up and talk about medication Wound Examination patient has an extensive stage IV wound over her lower sacral are mostly to the left but then tunneling towards the lower sacrum she has a smaller more superficial wound just under the right greater trochanter will do these areas were extensively debridement I suspect this sacral wound has coexistent infection quite possibly osteomyelitis there is also soft tissue tenderness no subcutaneous crepitus i felt Integumentary (Hair, Skin) Other than wounds there were no other skin lesions seen. Wound #1 status is Open. Original cause of wound was Gradually Appeared. The wound is located on the Sacrum. The wound measures 6cm length x 3.2cm width x 4.5cm depth; 15.08cm^2 area and 67.858cm^3 volume. The wound is limited to skin breakdown. There is no tunneling or undermining noted. There is a large amount of serosanguineous drainage noted. The wound margin is thickened. There is medium (34- 66%) granulation within the wound bed. There is a medium (34-66%) amount of necrotic tissue within the wound bed including Adherent Slough. The periwound skin appearance exhibited:  Moist. The periwound skin appearance did not exhibit: Callus, Crepitus, Excoriation, Fluctuance, Friable, Induration, Localized Edema, Rash, Scarring, Dry/Scaly, Maceration, Atrophie Blanche, Cyanosis, Ecchymosis,  Hemosiderin Staining, Mottled, Pallor, Rubor, Erythema. Periwound temperature was noted as No Abnormality. The periwound has tenderness on palpation. Wound #2 status is Open. Original cause of wound was Gradually Appeared. The wound is located on the Right Ischial Tuberosity. The wound measures 1cm length x 1cm width x 0.1cm depth; 0.785cm^2 area and 0.079cm^3 volume. The wound is limited to skin breakdown. There is no tunneling or undermining noted. There is a small amount of serosanguineous drainage noted. The wound margin is distinct with the outline attached to the wound base. There is medium (34-66%) granulation within the wound bed. There is a medium (34-66%) amount of necrotic tissue within the wound bed including Adherent Slough. The periwound skin appearance exhibited: Moist. The periwound skin appearance did not exhibit: Callus, Crepitus, Excoriation, Fluctuance, Friable, Induration, Localized Edema, Rash, Scarring, Dry/Scaly, Maceration, Atrophie Blanche, Cyanosis, Ecchymosis, Hemosiderin Staining, Mottled, Pallor, Rubor, Erythema. Periwound temperature was noted as No Abnormality. The periwound has tenderness on palpation. 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 ([id number]) Procedures Wound #1 Wound #1 is a Pressure Ulcer located on the Sacrum . There was a Skin/Subcutaneous Tissue/Muscle/Bone Debridement (40981-19147) debridement with total area of 19.2 sq cm performed by Maxwell Caul, MD. with the following instrument(s): Blade and Forceps to remove Non-Viable tissue/material including Exudate, Fibrin/Slough, Eschar, and Subcutaneous after achieving pain control using Lidocaine 4% Topical Solution. A time out was conducted prior to the start of the procedure. A Moderate amount of bleeding was controlled with Pressure. The  procedure was tolerated well with a pain level of Insensate throughout and a pain level of Insensate following the procedure. Post Debridement Measurements: 6cm length x 3.2cm width x 4.5cm depth; 67.858cm^3 volume. Post debridement Stage noted as Category/Stage IV. Post procedure Diagnosis Wound #1: Same as Pre-Procedure Wound #2 Wound #2 is a Pressure Ulcer located on the Right Ischial Tuberosity . There was a Skin/Subcutaneous Tissue/Muscle/Bone Debridement ([id number]) debridement with total area of 1 sq cm performed by Maxwell Caul, MD. with the following instrument(s): Curette to remove Non-Viable tissue/material including Exudate, Fibrin/Slough, Eschar, and Subcutaneous after achieving pain control using Lidocaine 4% Topical Solution. A time out was conducted prior to the start of the procedure. A Moderate amount of bleeding was controlled with Pressure. The procedure was tolerated well with a pain level of Insensate throughout and a pain level of Insensate following the procedure. Post Debridement Measurements: 1cm length x 1cm width x 0.2cm depth; 0.157cm^3 volume. Post debridement Stage noted as Category/Stage II. Post procedure Diagnosis Wound #2: Same as Pre-Procedure Plan Wound Cleansing: Wound #1 Sacrum: Clean wound with Normal Saline. Wound #2 Right Ischial Tuberosity: Clean wound with Normal Saline. Anesthetic: Wound #1 Sacrum: Topical Lidocaine 4% cream applied to wound bed prior to debridement Wound #2 Right Ischial Tuberosity: Topical Lidocaine 4% cream applied to wound bed prior to debridement Skin Barriers/Peri-Wound Care: Alcaide, [name] ([id number]) Wound #1 Sacrum: Barrier cream Wound #2 Right Ischial Tuberosity: Barrier cream Primary Wound Dressing: Wound #1 Sacrum: Aquacel Ag Wound #2 Right Ischial Tuberosity: Aquacel Ag Secondary Dressing: Wound #1 Sacrum: Boardered Foam Dressing Wound #2 Right Ischial Tuberosity: Boardered Foam  Dressing Dressing Change Frequency: Wound #1 Sacrum: Change dressing every other day. - may change daily as needed  for excess drainage Wound #2 Right Ischial Tuberosity: Change dressing every other day. - may change daily as needed for excess drainage Follow-up Appointments: Wound #1 Sacrum: Return Appointment in 1 week. Wound #2 Right Ischial Tuberosity: Return Appointment in 1 week. Off-Loading: Wound #1 Sacrum: Roho cushion for wheelchair - To be ordered by Home health Turn and reposition every 2 hours [facility] bed ordered by [facility] Wound #2 Right Ischial Tuberosity: Roho cushion for wheelchair - To be ordered by Home health Turn and reposition every 2 hours [facility] - Hospital bed ordered by [facility] Additional Orders / Instructions: Wound #1 Sacrum: Increase protein intake. Other: - Supplements: Vitamin C, MVI, Zinc Wound #2 Right Ischial Tuberosity: Increase protein intake. Other: - Supplements: Vitamin C, MVI, Zinc Home Health: Wound #1 Sacrum: Continue Home Health Visits - [facility] Home Health Nurse may visit PRN to address patient s wound care needs. FACE TO FACE ENCOUNTER: MEDICARE and MEDICAID PATIENTS: I certify that this patient is under my care and that I had a face-to-face encounter that meets the physician face-to-face encounter requirements with this patient on this date. The encounter with the patient was in whole or in part for the following MEDICAL CONDITION: (primary reason for Home Healthcare) MEDICAL NECESSITY: I certify, that based on my findings, NURSING services are a medically necessary home health service. HOME BOUND STATUS: I certify that my clinical findings support that this patient is homebound (i.e., Due to illness or injury, pt requires aid of supportive devices such as crutches, cane, wheelchairs, walkers, the use Baumbach, Margarit B. ([id number]) of special transportation or the assistance of  another person to leave their place of residence. There is a normal inability to leave the home and doing so requires considerable and taxing effort. Other absences are for medical reasons / religious services and are infrequent or of short duration when for other reasons). If current dressing causes regression in wound condition, may D/C ordered dressing product/s and apply Normal Saline Moist Dressing daily until next Wound Healing Center / Other MD appointment. Notify Wound Healing Center of regression in wound condition at [phone]. Please direct any NON-WOUND related issues/requests for orders to patient's Primary Care Physician Wound #2 Right Ischial Tuberosity: Continue Home Health Visits - [facility] Home Health Nurse may visit PRN to address patient s wound care needs. FACE TO FACE ENCOUNTER: MEDICARE and MEDICAID PATIENTS: I certify that this patient is under my care and that I had a face-to-face encounter that meets the physician face-to-face encounter requirements with this patient on this date. The encounter with the patient was in whole or in part for the following MEDICAL CONDITION: (primary reason for Home Healthcare) MEDICAL NECESSITY: I certify, that based on my findings, NURSING services are a medically necessary home health service. HOME BOUND STATUS: I certify that my clinical findings support that this patient is homebound (i.e., Due to illness or injury, pt requires aid of supportive devices such as crutches, cane, wheelchairs, walkers, the use of special transportation or the assistance of another person to leave their place of residence. There is a normal inability to leave the home and doing so requires considerable and taxing effort. Other absences are for medical reasons / religious services and are infrequent or of short duration when for other reasons). If current dressing causes regression in wound condition, may D/C ordered dressing product/s and  apply Normal Saline Moist Dressing daily until next Wound Healing Center / Other MD  appointment. Notify Wound Healing Center of regression in wound condition at [phone]. Please direct any NON-WOUND related issues/requests for orders to patient's Primary Care Physician Medications-please add to medication list.: Wound #1 Sacrum: P.O. Antibiotics - Doxy 100mg  twice a day for 7 days P.O. Antibiotics - Ciprofloxacin 500mg /65ml twice a day for 7 days Wound #2 Right Ischial Tuberosity: P.O. Antibiotics - Doxy 100mg  twice a day for 7 days P.O. Antibiotics - Ciprofloxacin 500mg /48ml twice a day for 7 days Laboratory ordered were: Wound culture routine - Sacrum General Notes: ABT script given to family. #1 unfortunate woman with advanced Alzheimer's disease. She had moderate protein calorie malnutrition when she left the hospital but I wonder if she has been working on this since. She does not appear to be grossly dehydrated. #2 both her wounds underwent aggressive surgical debridement. The one on the left greater trochanter actually does not appear unhealthy. Unfortunately the tunneling area towards her sacrum as exposed bone. Debridement of moderate amounts of necrotic fat and subcutaneous tissue, and surface bone #3 wounds will be dressed with Aquacel Ag to try and address the bioburden. #4 a culture of the wound bed was done right over the surface of bone. I am concerned enough about infection that I started this patient on doxycycline and Cipro empirically. #5 getting an outpatient x-ray done of this area would be virtually impossible. As noted she did have it CT Grigg, Dymin B. ([id number]) scan during her short hospitalization 3 weeks ago #6 I couldn't really tell whether she had a 3 day hospital qualifying stay which were qualify her for nursing home admission for up to 30 days post discharge. Her family members looked exhausted and fearful about her wound care needs. Electronic  Signature(s) Signed: 10/16/2015 8:13:25 AM By: Baltazar Najjar MD Entered By: Baltazar Najjar on 10/15/2015 12:54:38 Iden, [name] ([id number]) -------------------------------------------------------------------------------- ROS/PFSH Details Patient Name: Natal, [name] Date of Service: 10/15/2015 8:45 AM Medical Record Patient Account Number: [id number] Number: Treating RN: [name], RN, [name], [name] [birth year] [id number] y.o. Other Clinician: Date of Birth/Sex: Female) Treating ROBSON, [name] Primary Care Physician/Extender: [name], [name] Physician: Referring Physician: Thedore Mins, [name] Weeks in Treatment: 0 Information Obtained From Caregiver Chart Wound History Do you currently have one or more open woundso Yes How many open wounds do you currently haveo 2 Approximately how long have you had your woundso 62month How have you been treating your wound(s) until nowo vaseline Has your wound(s) ever healed and then re-openedo No Have you had any lab work done in the past montho No Have you tested positive for osteomyelitis (bone infection)o No Have you had any tests for circulation on your legso No Have you had other problems associated with your woundso Infection Constitutional Symptoms (General Health) Complaints and Symptoms: Positive for: Fatigue Eyes Complaints and Symptoms: No Complaints or Symptoms Complaints and Symptoms: Negative for: Dry Eyes; Vision Changes; Glasses / Contacts Medical History: Positive for: Cataracts Integumentary (Skin) Complaints and Symptoms: Positive for: Wounds; Breakdown Medical History: Positive for: History of pressure wounds Musculoskeletal Shaker, Tacey B. ([id number]) Complaints and Symptoms: Positive for: Muscle Weakness Medical History: Positive for: Osteoarthritis Ear/Nose/Mouth/Throat Complaints and Symptoms: No Complaints or Symptoms Medical History: Negative for: Chronic sinus problems/congestion; Middle ear  problems Hematologic/Lymphatic Complaints and Symptoms: No Complaints or Symptoms Medical History: Positive for: Anemia Respiratory Complaints and Symptoms: No Complaints or Symptoms Medical History: Negative for: Aspiration; Asthma; Chronic Obstructive Pulmonary Disease (COPD); Pneumothorax; Sleep Apnea; Tuberculosis Cardiovascular Complaints and Symptoms: No Complaints or Symptoms  Medical History: Positive for: Arrhythmia; Hypertension; Myocardial Infarction Gastrointestinal Complaints and Symptoms: No Complaints or Symptoms Medical History: Negative for: Cirrhosis ; Colitis; Crohnos; Hepatitis A; Hepatitis B; Hepatitis C Endocrine Complaints and Symptoms: No Complaints or Symptoms Dyment, Tyara B. ([id number]) Medical History: Negative for: Type I Diabetes; Type II Diabetes Genitourinary Complaints and Symptoms: No Complaints or Symptoms Immunological Complaints and Symptoms: No Complaints or Symptoms Medical History: Negative for: Lupus Erythematosus; Raynaudos; Scleroderma Neurologic Complaints and Symptoms: No Complaints or Symptoms Medical History: Positive for: Dementia Oncologic Complaints and Symptoms: No Complaints or Symptoms Medical History: Negative for: Received Chemotherapy; Received Radiation Psychiatric Complaints and Symptoms: No Complaints or Symptoms Medical History: Negative for: Anorexia/bulimia; Confinement Anxiety HBO Extended History Items Eyes: Cataracts Family and Social History Cancer: Yes - husband; Diabetes: Yes - [place]; Heart Disease: No; Hereditary Spherocytosis: No; Hypertension: Yes - husband; Kidney Disease: No; Lung Disease: No; Seizures: No; Stroke: No; Thyroid Problems: No; Tuberculosis: No; Former smoker; Marital Status - Widowed; Alcohol Use: Never; Drug Use: No History; Caffeine Use: Moderate; Financial Concerns: No; Food, Clothing or Shelter Needs: No; Support System Lacking: No; Transportation Concerns: No;  Advanced Directives: Yes; Living Will: Yes Zazueta, [name] ([id number]) Electronic Signature(s) Signed: 10/15/2015 3:54:41 PM By: Elpidio Eric [name], RN Signed: 10/16/2015 8:13:25 AM By: Baltazar Najjar MD Entered By: Elpidio Eric on 10/15/2015 09:08:09 Romberger, [name] ([id number]) -------------------------------------------------------------------------------- SuperBill Details Patient Name: Friscia, Simonne B. Date of Service: 10/15/2015 Medical Record Patient Account Number: [id number] Number: Treating RN: [name], RN, [name], [name] 12-23-1935 [id number] y.o. Other Clinician: Date of Birth/Sex: Female) Treating ROBSON, [name] Primary Care Physician/Extender: [name], [name] Physician: Weeks in Treatment: 0 Referring Physician: [facility], [name] Diagnosis Coding ICD-10 Codes Code Description L89.154 Pressure ulcer of sacral region, stage 4 L03.317 Cellulitis of buttock E43 Unspecified severe protein-calorie malnutrition L89.213 Pressure ulcer of right hip, stage 3 Facility Procedures CPT4 Code: [id number] Description: 99213 - WOUND CARE VISIT-LEV 3 EST PT Modifier: Quantity: 1 CPT4 Code: [id number] Description: 11044 - DEB BONE 20 SQ CM/< ICD-10 Description Diagnosis L89.154 Pressure ulcer of sacral region, stage 4 Modifier: Quantity: 1 CPT4 Code: [id number] Description: 11047 - DEB BONE EA ADDL 20 SQ CM/< ICD-10 Description Diagnosis L89.154 Pressure ulcer of sacral region, stage 4 Modifier: Quantity: 1 Physician Procedures CPT4: Description Modifier Quantity Code [id number] 99204 - WC PHYS LEVEL 4 - NEW PT 1 ICD-10 Description Diagnosis L89.154 Pressure ulcer of sacral region, stage 4 CPT4: 0347425 Debridement; bone (includes epidermis, dermis, subQ tissue, muscle 1 and/or fascia, if performed) 1st 20 sqcm or less ICD-10 Description Diagnosis Gulotta, [name] ([id number]) Electronic Signature(s) Signed: 10/16/2015 8:13:25 AM By: Baltazar Najjar MD Entered By: Baltazar Najjar on  10/15/2015 12:55:07

## 2015-10-16 NOTE — Progress Notes (Signed)
[name] ([id number]) Visit Report for 10/15/2015 Allergy List Details Patient Name: [name], [name] Date of Service: 10/15/2015 8:45 AM Medical Record Patient Account Number: [id number] Number: Treating RN: [name], RN, [name], [name] 26-Jul-1936 [phone] y.o. Other Clinician: Date of Birth/Sex: Female) Treating [name] Primary Care Physician: [name] Physician/Extender: G Referring Physician: Thedore Mins, [name] Weeks in Treatment: 0 Allergies Active Allergies penicillin Allergy Notes Electronic Signature(s) Signed: 10/15/2015 3:54:41 PM By: Elpidio Eric [name], RN Entered By: Elpidio Eric on 10/15/2015 09:00:44 [name] ([id number]) -------------------------------------------------------------------------------- Arrival Information Details Patient Name: [name], [name] Date of Service: 10/15/2015 8:45 AM Medical Record Patient Account Number: [id number] Number: Treating RN: [name], RN, [name], [name] 01-Apr-1936 [phone] y.o. Other Clinician: Date of Birth/Sex: Female) Treating [name] Primary Care Physician: [name] Physician/Extender: G Referring Physician: [name] Weeks in Treatment: 0 Visit Information Patient Arrived: Wheel Chair Arrival Time: 08:57 Accompanied By: dtr Transfer Assistance: None Patient Identification Verified: Yes Secondary Verification Process Yes Completed: Patient Requires Transmission-Based No Precautions: Patient Has Alerts: No Electronic Signature(s) Signed: 10/15/2015 3:54:41 PM By: Elpidio Eric [name], RN Entered By: Elpidio Eric on 10/15/2015 08:59:13 [name] ([id number]) -------------------------------------------------------------------------------- Clinic Level of Care Assessment Details Patient Name: [name], [name] Date of Service: 10/15/2015 8:45 AM Medical Record Patient Account Number: [id number] Number: Treating RN: [name], RN, [name], [name] 07/28/1936 [phone] y.o.  Other Clinician: Date of Birth/Sex: Female) Treating [name] Primary Care Physician: [name] Physician/Extender: G Referring Physician: Thedore Mins, [name] Weeks in Treatment: 0 Clinic Level of Care Assessment Items TOOL 1 Quantity Score  - Use when EandM and Procedure is performed on INITIAL visit 0 ASSESSMENTS - Nursing Assessment / Reassessment X - General Physical Exam (combine w/ comprehensive assessment (listed just 1 20 below) when performed on new pt. evals) X - Comprehensive Assessment (HX, ROS, Risk Assessments, Wounds Hx, etc.) 1 25 ASSESSMENTS - Wound and Skin Assessment / Reassessment  - Dermatologic / Skin Assessment (not related to wound area) 0 ASSESSMENTS - Ostomy and/or Continence Assessment and Care  - Incontinence Assessment and Management 0  - Ostomy Care Assessment and Management (repouching, etc.) 0 PROCESS - Coordination of Care X - Simple Patient / Family Education for ongoing care 1 15  - Complex (extensive) Patient / Family Education for ongoing care 0 X - Staff obtains Chiropractor, Records, Test Results / Process Orders 1 10  - Staff telephones HHA, Nursing Homes / Clarify orders / etc 0  - Routine Transfer to another Facility (non-emergent condition) 0  - Routine Hospital Admission (non-emergent condition) 0 X - New Admissions / Manufacturing engineer / Ordering NPWT, Apligraf, etc. 1 15  - Emergency Hospital Admission (emergent condition) 0 PROCESS - Special Needs  - Pediatric / Minor Patient Management 0 [name] ([id number])  - Isolation Patient Management 0  - Hearing / Language / Visual special needs 0  - Assessment of Community assistance (transportation, D/C planning, etc.) 0 X - Additional assistance / Altered mentation 1 15  - Support Surface(s) Assessment (bed, cushion, seat, etc.) 0 INTERVENTIONS - Miscellaneous  - External ear exam 0  - Patient Transfer (multiple staff / Nurse, adult /  Similar devices) 0  - Simple Staple / Suture removal (25 or less) 0  - Complex Staple / Suture removal (26 or more) 0  - Hypo/Hyperglycemic Management (do not check if billed separately) 0  - Ankle / Brachial Index (ABI) - do not check if billed separately 0 Has the patient  been seen at the hospital within the last three years: Yes Total Score: 100 Level Of Care: New/Established - Level 3 Electronic Signature(s) Signed: 10/15/2015 3:54:41 PM By: Elpidio Eric [name], RN Entered By: Elpidio Eric on 10/15/2015 10:07:31 [name] ([id number]) -------------------------------------------------------------------------------- Encounter Discharge Information Details Patient Name: [name], [name] Date of Service: 10/15/2015 8:45 AM Medical Record Patient Account Number: [id number] Number: Treating RN: [name], RN, [name], [name] 1935-08-12 [phone] y.o. Other Clinician: Date of Birth/Sex: Female) Treating [name] Primary Care Physician: [name] Physician/Extender: G Referring Physician: Thedore Mins, [name] Weeks in Treatment: 0 Encounter Discharge Information Items Discharge Pain Level: 0 Discharge Condition: Unstable Ambulatory Status: Wheelchair Discharge Destination: Home Private Transportation: Auto Accompanied By: dtr Schedule Follow-up Appointment: No Medication Reconciliation completed and No provided to Patient/Care [name]: Clinical Summary of Care: Electronic Signature(s) Signed: 10/15/2015 3:54:41 PM By: Elpidio Eric [name], RN Entered By: Elpidio Eric on 10/15/2015 09:59:48 [name] ([id number]) -------------------------------------------------------------------------------- Lower Extremity Assessment Details Patient Name: [name], [name] Date of Service: 10/15/2015 8:45 AM Medical Record Patient Account Number: [id number] Number: Treating RN: [name], RN, [name], [name] May 07, 1936 [phone] y.o. Other Clinician: Date of Birth/Sex: Female)  Treating [name] Primary Care Physician: [name] Physician/Extender: G Referring Physician: [name] Weeks in Treatment: 0 Electronic Signature(s) Signed: 10/15/2015 3:54:41 PM By: Elpidio Eric [name], RN Entered By: Elpidio Eric on 10/15/2015 09:08:38 Mcnee, [name] ([id number]) -------------------------------------------------------------------------------- Multi Wound Chart Details Patient Name: [name], [name] Date of Service: 10/15/2015 8:45 AM Medical Record Patient Account Number: [id number] Number: Treating RN: [name], RN, [name], [name] 04/19/1936 [phone] y.o. Other Clinician: Date of Birth/Sex: Female) Treating [name] Primary Care Physician: [name] Physician/Extender: G Referring Physician: Thedore Mins, [name] Weeks in Treatment: 0 Vital Signs Height(in): 63 Pulse(bpm): 101 Weight(lbs): Blood Pressure (mmHg): Body Mass Index(BMI): Temperature(F): Respiratory Rate 16 (breaths/min): Photos: [1:No Photos] [2:No Photos] [N/A:N/A] Wound Location: [1:Sacrum] [2:Right Ischial Tuberosity N/A] Wounding Event: [1:Gradually Appeared] [2:Gradually Appeared] [N/A:N/A] Primary Etiology: [1:Pressure Ulcer] [2:Pressure Ulcer] [N/A:N/A] Comorbid History: [1:Cataracts, Anemia, Arrhythmia, Hypertension, Arrhythmia, Hypertension, Myocardial Infarction, History of pressure wounds, Osteoarthritis, Dementia] [2:Cataracts, Anemia, Myocardial Infarction, History of pressure wounds,  Osteoarthritis, Dementia] [N/A:N/A] Date Acquired: [1:09/10/2015] [2:09/10/2015] [N/A:N/A] Weeks of Treatment: [1:0] [2:0] [N/A:N/A] Wound Status: [1:Open] [2:Open] [N/A:N/A] Measurements L x W x D 6x3.2x4.5 [2:1x1x0.1] [N/A:N/A] (cm) Area (cm) : [1:15.08] [2:0.785] [N/A:N/A] Volume (cm) : [1:67.858] [2:0.079] [N/A:N/A] % Reduction in Area: [1:0.00%] [2:0.00%] [N/A:N/A] % Reduction in Volume: 0.00% [2:0.00%] [N/A:N/A] Classification: [1:Category/Stage IV] [2:Category/Stage  II] [N/A:N/A] Exudate Amount: [1:Large] [2:Small] [N/A:N/A] Exudate Type: [1:Serosanguineous] [2:Serosanguineous] [N/A:N/A] Exudate Color: [1:red, brown] [2:red, brown] [N/A:N/A] Foul Odor After [1:Yes] [2:No] [N/A:N/A] Cleansing: Odor Anticipated Due to No [2:N/A] [N/A:N/A] Product Use: Wound Margin: [1:Thickened] [2:Distinct, outline attached N/A] Granulation Amount: Medium (34-66%) Medium (34-66%) N/A Necrotic Amount: Medium (34-66%) Medium (34-66%) N/A Exposed Structures: Fascia: No Fascia: No N/A Fat: No Fat: No Tendon: No Tendon: No Muscle: No Muscle: No Joint: No Joint: No Bone: No Bone: No Limited to Skin Limited to Skin Breakdown Breakdown Epithelialization: None None N/A Periwound Skin Texture: Edema: No Edema: No N/A Excoriation: No Excoriation: No Induration: No Induration: No Callus: No Callus: No Crepitus: No Crepitus: No Fluctuance: No Fluctuance: No Friable: No Friable: No Rash: No Rash: No Scarring: No Scarring: No Periwound Skin Moist: Yes Moist: Yes N/A Moisture: Maceration: No Maceration: No Dry/Scaly: No Dry/Scaly: No Periwound Skin Color: Atrophie Blanche: No Atrophie Blanche: No N/A Cyanosis: No Cyanosis: No Ecchymosis: No Ecchymosis: No Erythema: No  Erythema: No Hemosiderin Staining: No Hemosiderin Staining: No Mottled: No Mottled: No Pallor: No Pallor: No Rubor: No Rubor: No Temperature: No Abnormality No Abnormality N/A Tenderness on Yes Yes N/A Palpation: Wound Preparation: Ulcer Cleansing: Ulcer Cleansing: N/A Rinsed/Irrigated with Rinsed/Irrigated with Saline Saline Topical Anesthetic Topical Anesthetic Applied: Other: lidocaine Applied: Other: lidocaine 4% 4% Treatment Notes Electronic Signature(s) Signed: 10/15/2015 3:54:41 PM By: Elpidio Eric [name], RN Entered By: Elpidio Eric on 10/15/2015 09:53:43 Aston, [name]  ([id number]) -------------------------------------------------------------------------------- Multi-Disciplinary Care Plan Details Patient Name: Mccartt, [name] Date of Service: 10/15/2015 8:45 AM Medical Record Patient Account Number: [id number] Number: Treating RN: [name], RN, [name], [name] Jan 01, 1936 [phone] y.o. Other Clinician: Date of Birth/Sex: Female) Treating [name] Primary Care Physician: [name] Physician/Extender: G Referring Physician: Thedore Mins, [name] Weeks in Treatment: 0 Active Inactive Abuse / Safety / Falls / Self Care Management Nursing Diagnoses: Impaired home maintenance Impaired physical mobility Knowledge deficit related to: safety; personal, health (wound), emergency Potential for falls Self care deficit: actual or potential Goals: Patient will remain injury free Date Initiated: 10/15/2015 Goal Status: Active Patient/caregiver will verbalize understanding of skin care regimen Date Initiated: 10/15/2015 Goal Status: Active Patient/caregiver will verbalize/demonstrate measure taken to improve self care Date Initiated: 10/15/2015 Goal Status: Active Patient/caregiver will verbalize/demonstrate measures taken to improve the patient's personal safety Date Initiated: 10/15/2015 Goal Status: Active Patient/caregiver will verbalize/demonstrate measures taken to prevent injury and/or falls Date Initiated: 10/15/2015 Goal Status: Active Patient/caregiver will verbalize/demonstrate understanding of what to do in case of emergency Date Initiated: 10/15/2015 Goal Status: Active Interventions: Assess fall risk on admission and as needed Assess: immobility, friction, shearing, incontinence upon admission and as needed Assess impairment of mobility on admission and as needed per policy Gallentine, [name] ([id number]) Assess self care needs on admission and as needed Provide education on basic hygiene Provide education on fall prevention Provide  education on personal and home safety Provide education on safe transfers Treatment Activities: Patient referred to home care : 10/15/2015 Notes: Nutrition Nursing Diagnoses: Imbalanced nutrition Impaired glucose control: actual or potential Potential for alteratiion in Nutrition/Potential for imbalanced nutrition Goals: Patient/caregiver agrees to and verbalizes understanding of need to obtain nutritional consultation Date Initiated: 10/15/2015 Goal Status: Active Patient/caregiver agrees to and verbalizes understanding of need to use nutritional supplements and/or vitamins as prescribed Date Initiated: 10/15/2015 Goal Status: Active Patient/caregiver verbalizes understanding of need to maintain therapeutic glucose control per primary care physician Date Initiated: 10/15/2015 Goal Status: Active Interventions: Assess patient nutrition upon admission and as needed per policy Provide education on elevated blood sugars and impact on wound healing Provide education on nutrition Notes: Orientation to the Wound Care Program Nursing Diagnoses: Knowledge deficit related to the wound healing center program Goals: Patient/caregiver will verbalize understanding of the Wound Healing Center Program Date Initiated: 10/15/2015 Accardo, [name] ([id number]) Goal Status: Active Interventions: Provide education on orientation to the wound center Notes: Pressure Nursing Diagnoses: Knowledge deficit related to causes and risk factors for pressure ulcer development Knowledge deficit related to management of pressures ulcers Potential for impaired tissue integrity related to pressure, friction, moisture, and shear Goals: Patient will remain free from development of additional pressure ulcers Date Initiated: 10/15/2015 Goal Status: Active Patient will remain free of pressure ulcers Date Initiated: 10/15/2015 Goal Status: Active Patient/caregiver will verbalize risk factors for pressure ulcer  development Date Initiated: 10/15/2015 Goal Status: Active Patient/caregiver will verbalize understanding of pressure ulcer management Date Initiated: 10/15/2015 Goal Status: Active Interventions: Assess: immobility, friction, shearing, incontinence  upon admission and as needed Assess offloading mechanisms upon admission and as needed Assess potential for pressure ulcer upon admission and as needed Provide education on pressure ulcers Treatment Activities: Patient referred for home evaluation of offloading devices/mattresses : 10/15/2015 Patient referred for pressure reduction/relief devices : 10/15/2015 Patient referred for seating evaluation to ensure proper offloading : 10/15/2015 Pressure reduction/relief device ordered : 10/15/2015 Notes: Wound/Skin Impairment Nursing Diagnoses: Impaired tissue integrity Radermacher, [name] ([id number]) Knowledge deficit related to smoking impact on wound healing Knowledge deficit related to ulceration/compromised skin integrity Goals: Patient/caregiver will verbalize understanding of skin care regimen Date Initiated: 10/15/2015 Goal Status: Active Ulcer/skin breakdown will have a volume reduction of 30% by week 4 Date Initiated: 10/15/2015 Goal Status: Active Ulcer/skin breakdown will have a volume reduction of 50% by week 8 Date Initiated: 10/15/2015 Goal Status: Active Ulcer/skin breakdown will have a volume reduction of 80% by week 12 Date Initiated: 10/15/2015 Goal Status: Active Ulcer/skin breakdown will heal within 14 weeks Date Initiated: 10/15/2015 Goal Status: Active Interventions: Assess patient/caregiver ability to obtain necessary supplies Assess patient/caregiver ability to perform ulcer/skin care regimen upon admission and as needed Assess ulceration(s) every visit Provide education on ulcer and skin care Treatment Activities: Patient referred to home care : 10/15/2015 Skin care regimen initiated : 10/15/2015 Notes: Electronic  Signature(s) Signed: 10/15/2015 3:54:41 PM By: Elpidio Eric [name], RN Entered By: Elpidio Eric on 10/15/2015 09:52:40 Shurtz, [name] ([id number]) -------------------------------------------------------------------------------- Pain Assessment Details Patient Name: [name], [name] Date of Service: 10/15/2015 8:45 AM Medical Record Patient Account Number: [id number] Number: Treating RN: [name], RN, [name], [name] Aug 17, 1935 [phone] y.o. Other Clinician: Date of Birth/Sex: Female) Treating [name] Primary Care Physician: [name] Physician/Extender: G Referring Physician: Thedore Mins, [name] Weeks in Treatment: 0 Active Problems Location of Pain Severity and Description of Pain Patient Has Paino Patient Unable to Respond Site Locations Pain Management and Medication Current Pain Management: Electronic Signature(s) Signed: 10/15/2015 3:54:41 PM By: Elpidio Eric [name], RN Entered By: Elpidio Eric on 10/15/2015 08:59:22 Ferrington, [name] ([id number]) -------------------------------------------------------------------------------- Patient/Caregiver Education Details Patient Name: Leibensperger, [name] Date of Service: 10/15/2015 8:45 AM Medical Record Patient Account Number: [id number] Number: Treating RN: [name], RN, [name], [name] 1936/06/10 [phone] y.o. Other Clinician: Date of Birth/Gender: Female) Treating [name] Primary Care Physician: [name] Physician/Extender: G Referring Physician: [name] Weeks in Treatment: 0 Education Assessment Education Provided To: Research scientist (physical sciences) Topics Provided Basic Hygiene: Methods: Explain/Verbal Responses: State content correctly Elevated Blood Sugar/ Impact on Healing: Methods: Explain/Verbal Responses: State content correctly Nutrition: Methods: Explain/Verbal Responses: State content correctly Pressure: Methods: Explain/Verbal Responses: State content correctly Safety: Methods:  Explain/Verbal Responses: State content correctly Welcome To The Wound Care Center: Methods: Explain/Verbal Responses: State content correctly Wound/Skin Impairment: Methods: Explain/Verbal Responses: State content correctly Electronic Signature(s) Signed: 10/15/2015 3:54:41 PM By: Elpidio Eric [name], RN Bockrath, [name] ([id number]) Entered By: Elpidio Eric on 10/15/2015 10:00:37 Loiseau, [name] ([id number]) -------------------------------------------------------------------------------- Wound Assessment Details Patient Name: [name], [name] Date of Service: 10/15/2015 8:45 AM Medical Record Patient Account Number: [id number] Number: Treating RN: [name], RN, [name], [name] 1935-12-04 [phone] y.o. Other Clinician: Date of Birth/Sex: Female) Treating [name] Primary Care Physician: [name] Physician/Extender: G Referring Physician: Thedore Mins, [name] Weeks in Treatment: 0 Wound Status Wound Number: 1 Primary Pressure Ulcer Etiology: Wound Location: Sacrum Wound Open Wounding Event: Gradually Appeared Status: Date Acquired: 09/10/2015 Comorbid Cataracts, Anemia, Arrhythmia, Weeks Of Treatment: 0 History: Hypertension, Myocardial Infarction, Clustered Wound: No History of pressure  wounds, Osteoarthritis, Dementia Wound Measurements Length: (cm) 6 Width: (cm) 3.2 Depth: (cm) 4.5 Area: (cm) 15.08 Volume: (cm) 67.858 % Reduction in Area: 0% % Reduction in Volume: 0% Epithelialization: None Tunneling: No Undermining: No Wound Description Classification: Category/Stage IV Wound Margin: Thickened Exudate Amount: Large Exudate Type: Serosanguineous Exudate Color: red, brown Foul Odor After Cleansing: Yes Due to Product Use: No Wound Bed Granulation Amount: Medium (34-66%) Exposed Structure Necrotic Amount: Medium (34-66%) Fascia Exposed: No Necrotic Quality: Adherent Slough Fat Layer Exposed: No Tendon Exposed: No Muscle Exposed: No Joint Exposed:  No Bone Exposed: No Limited to Skin Breakdown Periwound Skin Texture Texture Color No Abnormalities Noted: No No Abnormalities Noted: No Gorter, [name] ([id number]) Callus: No Atrophie Blanche: No Crepitus: No Cyanosis: No Excoriation: No Ecchymosis: No Fluctuance: No Erythema: No Friable: No Hemosiderin Staining: No Induration: No Mottled: No Localized Edema: No Pallor: No Rash: No Rubor: No Scarring: No Temperature / Pain Moisture Temperature: No Abnormality No Abnormalities Noted: No Tenderness on Palpation: Yes Dry / Scaly: No Maceration: No Moist: Yes Wound Preparation Ulcer Cleansing: Rinsed/Irrigated with Saline Topical Anesthetic Applied: Other: lidocaine 4%, Treatment Notes Wound #1 (Sacrum) 1. Cleansed with: Clean wound with Normal Saline 3. Peri-wound Care: Barrier cream 4. Dressing Applied: Aquacel Ag 5. Secondary Dressing Applied Bordered Foam Dressing Electronic Signature(s) Signed: 10/15/2015 3:54:41 PM By: Elpidio EricAfful, [name], RN Entered By: Elpidio EricAfful, [name] on 10/15/2015 09:18:04 Grist, [name] ([id number]) -------------------------------------------------------------------------------- Wound Assessment Details Patient Name: Derner, [name] Date of Service: 10/15/2015 8:45 AM Medical Record Patient Account Number: [id number] Number: Treating RN: Clover MealyAfful, RN, [name], [name] 12-Feb-1936 [phone](79 y.o. Other Clinician: Date of Birth/Sex: Female) Treating [name] Primary Care Physician: Leotis ShamesSINGH, [name] Physician/Extender: G Referring Physician: Thedore MinsSINGH, [name] Weeks in Treatment: 0 Wound Status Wound Number: 2 Primary Pressure Ulcer Etiology: Wound Location: Right Ischial Tuberosity Wound Open Wounding Event: Gradually Appeared Status: Date Acquired: 09/10/2015 Comorbid Cataracts, Anemia, Arrhythmia, Weeks Of Treatment: 0 History: Hypertension, Myocardial Infarction, Clustered Wound: No History of pressure wounds, Osteoarthritis,  Dementia Wound Measurements Length: (cm) 1 Width: (cm) 1 Depth: (cm) 0.1 Area: (cm) 0.785 Volume: (cm) 0.079 % Reduction in Area: 0% % Reduction in Volume: 0% Epithelialization: None Tunneling: No Undermining: No Wound Description Classification: Category/Stage II Wound Margin: Distinct, outline attached Exudate Amount: Small Exudate Type: Serosanguineous Exudate Color: red, brown Foul Odor After Cleansing: No Wound Bed Granulation Amount: Medium (34-66%) Exposed Structure Necrotic Amount: Medium (34-66%) Fascia Exposed: No Necrotic Quality: Adherent Slough Fat Layer Exposed: No Tendon Exposed: No Muscle Exposed: No Joint Exposed: No Bone Exposed: No Limited to Skin Breakdown Periwound Skin Texture Texture Color No Abnormalities Noted: No No Abnormalities Noted: No Goodwine, [name] ([id number]) Callus: No Atrophie Blanche: No Crepitus: No Cyanosis: No Excoriation: No Ecchymosis: No Fluctuance: No Erythema: No Friable: No Hemosiderin Staining: No Induration: No Mottled: No Localized Edema: No Pallor: No Rash: No Rubor: No Scarring: No Temperature / Pain Moisture Temperature: No Abnormality No Abnormalities Noted: No Tenderness on Palpation: Yes Dry / Scaly: No Maceration: No Moist: Yes Wound Preparation Ulcer Cleansing: Rinsed/Irrigated with Saline Topical Anesthetic Applied: Other: lidocaine 4%, Treatment Notes Wound #2 (Right Ischial Tuberosity) 1. Cleansed with: Clean wound with Normal Saline 3. Peri-wound Care: Barrier cream 4. Dressing Applied: Aquacel Ag 5. Secondary Dressing Applied Bordered Foam Dressing Electronic Signature(s) Signed: 10/15/2015 3:54:41 PM By: Elpidio EricAfful, [name], RN Entered By: Elpidio EricAfful, [name] on 10/15/2015 09:19:21 Taft, [name] ([id number]) -------------------------------------------------------------------------------- Vitals Details Patient Name: Maggi, [name] Date of Service:  10/15/2015 8:45 AM Medical  Record Patient Account Number: [id number] Number: Treating RN: [name], RN, [name], [name] 1936/05/30 [phone] y.o. Other Clinician: Date of Birth/Sex: Female) Treating [name] Primary Care Physician: [name] Physician/Extender: G Referring Physician: Thedore Mins, [name] Weeks in Treatment: 0 Vital Signs Time Taken: 09:08 Pulse (bpm): 101 Height (in): 63 Respiratory Rate (breaths/min): 16 Source: Stated Reference Range: 80 - 120 mg / dl Electronic Signature(s) Signed: 10/15/2015 3:54:41 PM By: Elpidio Eric [name], RN Entered By: Elpidio Eric on 10/15/2015 09:09:06

## 2015-10-16 NOTE — Progress Notes (Signed)
[name] [name] KLEE ([id number]) Visit Report for 10/15/2015 Abuse/Suicide Risk Screen Details Patient Name: [name], [name] B. Date of Service: 10/15/2015 8:45 AM Medical Record Patient Account Number: [id number] Number: Treating RN: [name], RN, [name], [name] March 24, 1936 [phone] y.o. Other Clinician: Date of Birth/Sex: Female) Treating ROBSON, [name] Primary Care Physician/Extender: [name], [name] Physician: Referring Physician: Thedore Mins, [name] Weeks in Treatment: 0 Abuse/Suicide Risk Screen Items Answer ABUSE/SUICIDE RISK SCREEN: Has anyone close to you tried to hurt or harm you recentlyo No Do you feel uncomfortable with anyone in your familyo No Has anyone forced you do things that you didnot want to doo No Patient displays signs or symptoms of abuse and/or neglect. No Electronic Signature(s) Signed: 10/15/2015 3:54:41 PM By: Elpidio Eric [name], RN Entered By: Elpidio Eric on 10/15/2015 09:00:52 [name] [name] ([id number]) -------------------------------------------------------------------------------- Activities of Daily Living Details Patient Name: [name], [name] B. Date of Service: 10/15/2015 8:45 AM Medical Record Patient Account Number: [id number] Number: Treating RN: [name], RN, [name], [name] 04/20/1936 [phone] y.o. Other Clinician: Date of Birth/Sex: Female) Treating ROBSON, [name] Primary Care Physician/Extender: [name], [name] Physician: Referring Physician: Thedore Mins, [name] Weeks in Treatment: 0 Activities of Daily Living Items Answer Activities of Daily Living (Please select one for each item) Drive Automobile Not Able Take Medications Need Assistance Use Telephone Need Assistance Care for Appearance Not Able Use Toilet Need Assistance Bath / Shower Not Able Dress Self Not Able Feed Self Not Able Walk Not Able Get In / Out Bed Not Able Housework Not Able Prepare Meals Not Able Handle Money Not Able Shop for Self Not Able Electronic  Signature(s) Signed: 10/15/2015 3:54:41 PM By: Elpidio Eric [name], RN Entered By: Elpidio Eric on 10/15/2015 09:01:31 [name] [name] ([id number]) -------------------------------------------------------------------------------- Education Assessment Details Patient Name: [name], [name] Stacks B. Date of Service: 10/15/2015 8:45 AM Medical Record Patient Account Number: [id number] Number: Treating RN: [name], RN, [name], [name] February 04, 1936 [phone] y.o. Other Clinician: Date of Birth/Sex: Female) Treating ROBSON, [name] Primary Care Physician/Extender: [name], [name] Physician: Referring Physician: [name] Weeks in Treatment: 0 Primary Learner Assessed: Caregiver deborah Jefferies Dementia, altered Reason Patient is not Primary Learner: mental status Learning Preferences/Education Level/Primary Language Learning Preference: Explanation Highest Education Level: College or Above Preferred Language: English Cognitive Barrier Assessment/Beliefs Language Barrier: No Physical Barrier Assessment Impaired Vision: No Impaired Hearing: No Decreased Hand dexterity: No Knowledge/Comprehension Assessment Knowledge Level: High Comprehension Level: High Ability to understand written High instructions: Ability to understand verbal High instructions: Motivation Assessment Anxiety Level: Calm Education Importance: Acknowledges Need Interest in Health Problems: Asks Questions Perception: Coherent Willingness to Engage in Self- High Management Activities: Readiness to Engage in Self- High Management Activities: Electronic Signature(s) Signed: 10/15/2015 3:54:41 PM By: Elpidio Eric [name], RN [name] [name] ([id number]) Entered By: Elpidio Eric on 10/15/2015 09:03:45 [name], Rojelio [name] ([id number]) -------------------------------------------------------------------------------- Fall Risk Assessment Details Patient Name: [name], [name] Stacks B. Date of Service: 10/15/2015 8:45 AM Medical  Record Patient Account Number: [id number] Number: Treating RN: [name], RN, [name], [name] 13-Oct-1935 [phone] y.o. Other Clinician: Date of Birth/Sex: Female) Treating ROBSON, [name] Primary Care Physician/Extender: [name], [name] Physician: Referring Physician: [name] Weeks in Treatment: 0 Fall Risk Assessment Items Have you had 2 or more falls in the last 12 monthso 0 No Have you had any fall that resulted in injury in the last 12 monthso 0 No FALL RISK ASSESSMENT: History of falling - immediate or within 3 months 0 No Secondary diagnosis 0 No Ambulatory aid None/bed rest/wheelchair/nurse 0 Yes Crutches/cane/walker  0 No Furniture 0 No IV Access/Saline Lock 0 No Gait/Training Normal/bed rest/immobile 0 Yes Weak 10 Yes Impaired 20 Yes Mental Status Oriented to own ability 0 No Electronic Signature(s) Signed: 10/15/2015 3:54:41 PM By: Elpidio EricAfful, [name] [name], RN Entered By: Elpidio EricAfful, [name] on 10/15/2015 09:02:50 [name], [name] ([id number]) -------------------------------------------------------------------------------- Foot Assessment Details Patient Name: [name], [name] StacksWILMA B. Date of Service: 10/15/2015 8:45 AM Medical Record Patient Account Number: [id number] Number: Treating RN: Clover MealyAfful, RN, [name], [name] Jul 20, 1936 [phone](79 y.o. Other Clinician: Date of Birth/Sex: Female) Treating ROBSON, [name] Primary Care Physician/Extender: Marlana LatusG SINGH, [name] Physician: Referring Physician: Thedore MinsSINGH, [name] Weeks in Treatment: 0 Foot Assessment Items Site Locations + = Sensation present, - = Sensation absent, C = Callus, U = Ulcer R = Redness, W = Warmth, M = Maceration, PU = Pre-ulcerative lesion F = Fissure, S = Swelling, D = Dryness Assessment Right: Left: Other Deformity: No No Prior Foot Ulcer: No No Prior Amputation: No No Charcot Joint: No No Ambulatory Status: Non-ambulatory Assistance Device: Wheelchair Gait: Surveyor, miningUnsteady Electronic Signature(s) Signed: 10/15/2015  3:54:41 PM By: Elpidio EricAfful, [name] [name], RN [place], [name] ([id number]) Entered By: Elpidio EricAfful, [name] on 10/15/2015 09:01:44 [place], [name] ([id number]) -------------------------------------------------------------------------------- Nutrition Risk Assessment Details Patient Name: Buckles, [name] B. Date of Service: 10/15/2015 8:45 AM Medical Record Patient Account Number: [id number] Number: Treating RN: Clover MealyAfful, RN, [name], [name] Jul 20, 1936 [phone](79 y.o. Other Clinician: Date of Birth/Sex: Female) Treating ROBSON, [name] Primary Care Physician/Extender: Marlana LatusG SINGH, [name] Physician: Referring Physician: Thedore MinsSINGH, [name] Weeks in Treatment: 0 Height (in): Weight (lbs): Body Mass Index (BMI): Nutrition Risk Assessment Items NUTRITION RISK SCREEN: I have an illness or condition that made me change the kind and/or 0 No amount of food I eat I eat fewer than two meals per day 0 No I eat few fruits and vegetables, or milk products 0 No I have three or more drinks of beer, liquor or wine almost every day 0 No I have tooth or mouth problems that make it hard for me to eat 0 No I don't always have enough money to buy the food I need 0 No I eat alone most of the time 0 No I take three or more different prescribed or over-the-counter drugs a 0 No day Without wanting to, I have lost or gained 10 pounds in the last six 2 Yes months I am not always physically able to shop, cook and/or feed myself 2 Yes Nutrition Protocols Good Risk Protocol Provide education on Moderate Risk Protocol 0 nutrition Electronic Signature(s) Signed: 10/15/2015 3:54:41 PM By: Elpidio EricAfful, [name] [name], RN Entered By: Elpidio EricAfful, [name] on 10/15/2015 09:02:25

## 2015-10-17 DIAGNOSIS — L89159 Pressure ulcer of sacral region, unspecified stage: Secondary | ICD-10-CM | POA: Diagnosis not present

## 2015-10-18 DIAGNOSIS — I10 Essential (primary) hypertension: Secondary | ICD-10-CM | POA: Diagnosis not present

## 2015-10-18 DIAGNOSIS — R000 Tachycardia, unspecified: Secondary | ICD-10-CM | POA: Diagnosis not present

## 2015-10-18 DIAGNOSIS — G309 Alzheimer's disease, unspecified: Secondary | ICD-10-CM | POA: Diagnosis not present

## 2015-10-18 DIAGNOSIS — L89210 Pressure ulcer of right hip, unstageable: Secondary | ICD-10-CM | POA: Diagnosis not present

## 2015-10-18 DIAGNOSIS — L89150 Pressure ulcer of sacral region, unstageable: Secondary | ICD-10-CM | POA: Diagnosis not present

## 2015-10-18 DIAGNOSIS — L89620 Pressure ulcer of left heel, unstageable: Secondary | ICD-10-CM | POA: Diagnosis not present

## 2015-10-18 DIAGNOSIS — E785 Hyperlipidemia, unspecified: Secondary | ICD-10-CM | POA: Diagnosis not present

## 2015-10-19 LAB — WOUND CULTURE

## 2015-10-21 DIAGNOSIS — L89154 Pressure ulcer of sacral region, stage 4: Secondary | ICD-10-CM | POA: Diagnosis not present

## 2015-10-21 DIAGNOSIS — L89159 Pressure ulcer of sacral region, unspecified stage: Secondary | ICD-10-CM | POA: Diagnosis not present

## 2015-10-22 DIAGNOSIS — R000 Tachycardia, unspecified: Secondary | ICD-10-CM | POA: Diagnosis not present

## 2015-10-22 DIAGNOSIS — L89210 Pressure ulcer of right hip, unstageable: Secondary | ICD-10-CM | POA: Diagnosis not present

## 2015-10-22 DIAGNOSIS — I10 Essential (primary) hypertension: Secondary | ICD-10-CM | POA: Diagnosis not present

## 2015-10-22 DIAGNOSIS — G309 Alzheimer's disease, unspecified: Secondary | ICD-10-CM | POA: Diagnosis not present

## 2015-10-22 DIAGNOSIS — E785 Hyperlipidemia, unspecified: Secondary | ICD-10-CM | POA: Diagnosis not present

## 2015-10-22 DIAGNOSIS — L89620 Pressure ulcer of left heel, unstageable: Secondary | ICD-10-CM | POA: Diagnosis not present

## 2015-10-22 DIAGNOSIS — L89150 Pressure ulcer of sacral region, unstageable: Secondary | ICD-10-CM | POA: Diagnosis not present

## 2015-10-23 ENCOUNTER — Ambulatory Visit
Admission: RE | Admit: 2015-10-23 | Discharge: 2015-10-23 | Disposition: A | Discharge status: Home / Self Care | Payer: Medicare Other | Source: Ambulatory Visit | Attending: Internal Medicine | Admitting: Internal Medicine

## 2015-10-23 ENCOUNTER — Other Ambulatory Visit: Payer: Self-pay | Admitting: Internal Medicine

## 2015-10-23 ENCOUNTER — Encounter: Payer: Medicare Other | Admitting: Internal Medicine

## 2015-10-23 DIAGNOSIS — I10 Essential (primary) hypertension: Secondary | ICD-10-CM | POA: Diagnosis not present

## 2015-10-23 DIAGNOSIS — T148XXA Other injury of unspecified body region, initial encounter: Secondary | ICD-10-CM

## 2015-10-23 DIAGNOSIS — M8588 Other specified disorders of bone density and structure, other site: Secondary | ICD-10-CM | POA: Insufficient documentation

## 2015-10-23 DIAGNOSIS — E43 Unspecified severe protein-calorie malnutrition: Secondary | ICD-10-CM | POA: Diagnosis not present

## 2015-10-23 DIAGNOSIS — M84459A Pathological fracture, hip, unspecified, initial encounter for fracture: Secondary | ICD-10-CM | POA: Insufficient documentation

## 2015-10-23 DIAGNOSIS — L89154 Pressure ulcer of sacral region, stage 4: Secondary | ICD-10-CM | POA: Diagnosis not present

## 2015-10-23 DIAGNOSIS — G309 Alzheimer's disease, unspecified: Secondary | ICD-10-CM | POA: Diagnosis not present

## 2015-10-23 DIAGNOSIS — M1990 Unspecified osteoarthritis, unspecified site: Secondary | ICD-10-CM | POA: Diagnosis not present

## 2015-10-23 DIAGNOSIS — I252 Old myocardial infarction: Secondary | ICD-10-CM | POA: Diagnosis not present

## 2015-10-23 DIAGNOSIS — L89212 Pressure ulcer of right hip, stage 2: Secondary | ICD-10-CM | POA: Diagnosis not present

## 2015-10-23 DIAGNOSIS — L97119 Non-pressure chronic ulcer of right thigh with unspecified severity: Secondary | ICD-10-CM | POA: Diagnosis not present

## 2015-10-23 DIAGNOSIS — D649 Anemia, unspecified: Secondary | ICD-10-CM | POA: Diagnosis not present

## 2015-10-23 DIAGNOSIS — S71001A Unspecified open wound, right hip, initial encounter: Secondary | ICD-10-CM | POA: Diagnosis not present

## 2015-10-23 DIAGNOSIS — Z87891 Personal history of nicotine dependence: Secondary | ICD-10-CM | POA: Diagnosis not present

## 2015-10-23 DIAGNOSIS — Z880 Allergy status to penicillin: Secondary | ICD-10-CM | POA: Diagnosis not present

## 2015-10-23 DIAGNOSIS — S32511A Fracture of superior rim of right pubis, initial encounter for closed fracture: Secondary | ICD-10-CM | POA: Diagnosis not present

## 2015-10-23 DIAGNOSIS — L89213 Pressure ulcer of right hip, stage 3: Secondary | ICD-10-CM | POA: Diagnosis not present

## 2015-10-23 DIAGNOSIS — L03317 Cellulitis of buttock: Secondary | ICD-10-CM | POA: Diagnosis not present

## 2015-10-25 DIAGNOSIS — G309 Alzheimer's disease, unspecified: Secondary | ICD-10-CM | POA: Diagnosis not present

## 2015-10-25 DIAGNOSIS — L89620 Pressure ulcer of left heel, unstageable: Secondary | ICD-10-CM | POA: Diagnosis not present

## 2015-10-25 DIAGNOSIS — R000 Tachycardia, unspecified: Secondary | ICD-10-CM | POA: Diagnosis not present

## 2015-10-25 DIAGNOSIS — L89210 Pressure ulcer of right hip, unstageable: Secondary | ICD-10-CM | POA: Diagnosis not present

## 2015-10-25 DIAGNOSIS — E785 Hyperlipidemia, unspecified: Secondary | ICD-10-CM | POA: Diagnosis not present

## 2015-10-25 DIAGNOSIS — I10 Essential (primary) hypertension: Secondary | ICD-10-CM | POA: Diagnosis not present

## 2015-10-25 DIAGNOSIS — L89150 Pressure ulcer of sacral region, unstageable: Secondary | ICD-10-CM | POA: Diagnosis not present

## 2015-10-25 NOTE — Progress Notes (Signed)
[name] ([id number]) Visit Report for 10/23/2015 Chief Complaint Document Details Patient Name: [name], [name] Date of Service: 10/23/2015 3:30 PM Medical Record Patient Account Number: [id number] Number: Treating RN: [name], RN, [name], [name] [birth year] [phone] y.o. Other Clinician: Date of Birth/Sex: Female) Treating ROBSON, [name] Primary Care Physician/Extender: [name], [name] Physician: Referring Physician: [name] Weeks in Treatment: 1 Information Obtained from: Patient Chief Complaint The patient is here for a sacral decubitus ulcer also a more superficial wound over her right hip Electronic Signature(s) Signed: 10/23/2015 4:33:06 PM By: Baltazar Najjar MD Entered By: Baltazar Najjar on 10/23/2015 16:23:18 Bogle, [name] ([id number]) -------------------------------------------------------------------------------- Debridement Details Patient Name: Meng, [name] Date of Service: 10/23/2015 3:30 PM Medical Record Patient Account Number: [id number] Number: Treating RN: [name], RN, [name], [name] 08/27/1935 [phone] y.o. Other Clinician: Date of Birth/Sex: Female) Treating ROBSON, [name] Primary Care Physician/Extender: [name], [name] Physician: Referring Physician: [name] Weeks in Treatment: 1 Debridement Performed for Wound #1 Sacrum Assessment: Performed By: Physician Maxwell Caul, MD Debridement: Debridement Pre-procedure Yes Verification/Time Out Taken: Start Time: 16:06 Pain Control: Lidocaine 4% Topical Solution Level: Skin/Subcutaneous Tissue Total Area Debrided (L x 5.5 (cm) x 4.5 (cm) = 24.75 (cm) W): Tissue and other Non-Viable, Fibrin/Slough, Subcutaneous material debrided: Instrument: Blade, Forceps Bleeding: Moderate Hemostasis Achieved: Pressure End Time: 16:11 Procedural Pain: 0 Post Procedural Pain: 0 Response to Treatment: Procedure was tolerated well Post Debridement Measurements of Total  Wound Length: (cm) 5.5 Stage: Category/Stage IV Width: (cm) 4.5 Depth: (cm) 4 Volume: (cm) 77.754 Post Procedure Diagnosis Same as Pre-procedure Electronic Signature(s) Signed: 10/23/2015 4:33:06 PM By: Baltazar Najjar MD Signed: 10/23/2015 5:40:11 PM By: Elpidio Eric [name], RN Entered By: Baltazar Najjar on 10/23/2015 16:22:47 Witz, [name] ([id number]) Burkemper, [name] ([id number]) -------------------------------------------------------------------------------- Debridement Details Patient Name: Clavijo, [name] Date of Service: 10/23/2015 3:30 PM Medical Record Patient Account Number: [id number] Number: Treating RN: [name], RN, [name], [name] [birth year] [phone] y.o. Other Clinician: Date of Birth/Sex: Female) Treating ROBSON, [name] Primary Care Physician/Extender: [name], [name] Physician: Referring Physician: [name] Weeks in Treatment: 1 Debridement Performed for Wound #2 Right Ischial Tuberosity Assessment: Performed By: Physician Maxwell Caul, MD Debridement: Debridement Pre-procedure Yes Verification/Time Out Taken: Start Time: 16:11 Pain Control: Lidocaine 4% Topical Solution Level: Skin/Subcutaneous Tissue Total Area Debrided (L x 1 (cm) x 1 (cm) = 1 (cm) W): Tissue and other Non-Viable, Fibrin/Slough, Subcutaneous material debrided: Instrument: Curette Bleeding: Moderate Hemostasis Achieved: Pressure End Time: 16:15 Procedural Pain: 0 Post Procedural Pain: 0 Response to Treatment: Procedure was tolerated well Post Debridement Measurements of Total Wound Length: (cm) 1 Stage: Category/Stage II Width: (cm) 1 Depth: (cm) 0.2 Volume: (cm) 0.157 Post Procedure Diagnosis Same as Pre-procedure Electronic Signature(s) Signed: 10/23/2015 4:33:06 PM By: Baltazar Najjar MD Signed: 10/23/2015 5:40:11 PM By: Elpidio Eric [name], RN Entered By: Baltazar Najjar on 10/23/2015 16:23:04 Schriver, [name] ([id number]) Mcdonagh, [name]  ([id number]) -------------------------------------------------------------------------------- HPI Details Patient Name: Ibrahim, [name] Date of Service: 10/23/2015 3:30 PM Medical Record Patient Account Number: [id number] Number: Treating RN: [name], RN, [name], [name] [birth year] [phone] y.o. Other Clinician: Date of Birth/Sex: Female) Treating ROBSON, [name] Primary Care Physician/Extender: [name], [name] Physician: Referring Physician: [name] Weeks in Treatment: 1 History of Present Illness HPI Description: 10/15/15; this is a very frail lady with advanced Alzheimer's disease who is being cared for aerobically at home by her daughter and granddaughter. She is essentially nonambulatory and seems to be minimally verbal. They  tell me that she started to develop a pressure area in her sacral area in January. Initially a very small open area that progressed up. There is also a more recent area on the right hip. The patient was seen in her primary office for this problem on 2/23 at that point noted to have a stage III wound with tunneling over the sacrum. There were 2 other areas noted on the upper left and right bilateral occipital. As well as a stage II on the right trochanter. She was referred to the ER because of tachycardia. She was hospitalized from 2/23 through 2/25. The admission this seems to open dominated by a distal fecal impaction. She was noted to have sinus tachycardia which improved. She did have a CT scan of the abdomen and pelvis during the hospitalization. In addition to the fecal impaction this noted the ulcer in the left lower gluteal region adjacent to the midline. There was air in the subcutaneous wound but no evidence of an air-fluid level to suggest an abscess. There was no comment about osteomyelitis. Since her return home she has not been eating well but is drinking. The family provides total care. I note that her albumin was 3 on admission to the  hospital. Sodium got as high as 148 but was normalized by the time she left 10/24/15; this is a patient who has advanced dementia. She has a deep probing stage IV pressure ulcer over her lower sacral area. Cultures of this last week grew both Proteus and methicillin-resistant staph aureus. I and empirically put her on Cipro and doxycycline which should cover both of these. He also has a wound over her right greater trochanter. She is been followed by home health at home they're requesting lab work which I think is reasonable. I ordered a CMP, CBC with differential, sedimentation rate and a C-reactive protein Electronic Signature(s) Signed: 10/23/2015 4:33:06 PM By: Baltazar Najjar MD Entered By: Baltazar Najjar on 10/23/2015 16:26:58 Demartin, [name] ([id number]) -------------------------------------------------------------------------------- Physical Exam Details Patient Name: Nishi, Annaleia B. Date of Service: 10/23/2015 3:30 PM Medical Record Patient Account Number: [id number] Number: Treating RN: [name], RN, [name], [name] [birth year] [phone] y.o. Other Clinician: Date of Birth/Sex: Female) Treating ROBSON, [name] Primary Care Physician/Extender: [name], [name] Physician: Referring Physician: Thedore Mins, [name] Weeks in Treatment: 1 Notes Wound exam; the area over the right greater trochanter has a surface slough that is debridement. This is a small area but I think is going to turn out to be at least a stage III wound. She has a stage IV wound over her lower sacral area. The base of this has necrotic tissue which I attempted to remove. There is exposed bone here. Electronic Signature(s) Signed: 10/23/2015 4:33:06 PM By: Baltazar Najjar MD Entered By: Baltazar Najjar on 10/23/2015 16:28:03 Krass, [name] ([id number]) -------------------------------------------------------------------------------- Physician Orders Details Patient Name: Guillotte, [name] Date of Service:  10/23/2015 3:30 PM Medical Record Patient Account Number: [id number] Number: Treating RN: [name], RN, [name], [name] 1935-11-17 [phone] y.o. Other Clinician: Date of Birth/Sex: Female) Treating ROBSON, [name] Primary Care Physician/Extender: [name], [name] Physician: Referring Physician: [name] Weeks in Treatment: 1 Verbal / Phone Orders: Yes Clinician: [name], RN, [name], [name] Read Back and Verified: Yes Diagnosis Coding Wound Cleansing Wound #1 Sacrum o Clean wound with Normal Saline. Wound #2 Right Ischial Tuberosity o Clean wound with Normal Saline. Anesthetic Wound #1 Sacrum o Topical Lidocaine 4% cream applied to wound bed prior to debridement Wound #2 Right Ischial Tuberosity   o Topical Lidocaine 4% cream applied to wound bed prior to debridement Skin Barriers/Peri-Wound Care Wound #1 Sacrum o Barrier cream Wound #2 Right Ischial Tuberosity o Barrier cream Primary Wound Dressing Wound #1 Sacrum o Aquacel Ag Wound #2 Right Ischial Tuberosity o Aquacel Ag Secondary Dressing Wound #1 Sacrum o Boardered Foam Dressing Wound #2 Right Ischial Tuberosity Dimperio, Lucielle B. ([id number]) o Boardered Foam Dressing Dressing Change Frequency Wound #1 Sacrum o Change dressing every other day. - may change daily as needed for excess drainage Wound #2 Right Ischial Tuberosity o Change dressing every other day. - may change daily as needed for excess drainage Follow-up Appointments Wound #1 Sacrum o Return Appointment in 1 week. Wound #2 Right Ischial Tuberosity o Return Appointment in 1 week. Off-Loading Wound #1 Sacrum o Roho cushion for wheelchair - To be ordered by Home health o Turn and reposition every 2 hours o [facility] bed ordered by [facility] Wound #2 Right Ischial Tuberosity o Roho cushion for wheelchair - To be ordered by Home health o Turn and reposition every 2 hours o [facility]  bed ordered by [facility] Additional Orders / Instructions Wound #1 Sacrum o Increase protein intake. o Other: - Supplements: Vitamin C, MVI, Zinc Wound #2 Right Ischial Tuberosity o Increase protein intake. o Other: - Supplements: Vitamin C, MVI, Zinc Home Health Wound #1 Sacrum o Continue Home Health Visits - [facility] o Home Health Nurse may visit PRN to address patientos wound care needs. o FACE TO FACE ENCOUNTER: MEDICARE and MEDICAID PATIENTS: I certify that this patient is under my care and that I had a face-to-face encounter that meets the physician face-to-face encounter requirements with this patient on this date. The encounter with the patient was in whole or in part for the following MEDICAL CONDITION: (primary reason for Home Healthcare) MEDICAL NECESSITY: I certify, that based on my findings, NURSING services are a medically necessary home health service. HOME BOUND STATUS: I certify that my clinical findings support that this patient is homebound (i.e., Due to illness or injury, pt requires aid of Gebel, Brenee B. ([id number]) supportive devices such as crutches, cane, wheelchairs, walkers, the use of special transportation or the assistance of another person to leave their place of residence. There is a normal inability to leave the home and doing so requires considerable and taxing effort. Other absences are for medical reasons / religious services and are infrequent or of short duration when for other reasons). o If current dressing causes regression in wound condition, may D/C ordered dressing product/s and apply Normal Saline Moist Dressing daily until next Wound Healing Center / Other MD appointment. Notify Wound Healing Center of regression in wound condition at [phone]. o Please direct any NON-WOUND related issues/requests for orders to patient's Primary Care Physician Wound #2 Right Ischial Tuberosity o Continue  Home Health Visits - [facility] o Home Health Nurse may visit PRN to address patientos wound care needs. o FACE TO FACE ENCOUNTER: MEDICARE and MEDICAID PATIENTS: I certify that this patient is under my care and that I had a face-to-face encounter that meets the physician face-to-face encounter requirements with this patient on this date. The encounter with the patient was in whole or in part for the following MEDICAL CONDITION: (primary reason for Home Healthcare) MEDICAL NECESSITY: I certify, that based on my findings, NURSING services are a medically necessary home health service. HOME BOUND STATUS: I certify that my clinical findings support  that this patient is homebound (i.e., Due to illness or injury, pt requires aid of supportive devices such as crutches, cane, wheelchairs, walkers, the use of special transportation or the assistance of another person to leave their place of residence. There is a normal inability to leave the home and doing so requires considerable and taxing effort. Other absences are for medical reasons / religious services and are infrequent or of short duration when for other reasons). o If current dressing causes regression in wound condition, may D/C ordered dressing product/s and apply Normal Saline Moist Dressing daily until next Wound Healing Center / Other MD appointment. Notify Wound Healing Center of regression in wound condition at [phone]. o Please direct any NON-WOUND related issues/requests for orders to patient's Primary Care Physician Medications-please add to medication list. Wound #1 Sacrum o P.O. Antibiotics - Continue Doxy  twice a day for 7 days o P.O. Antibiotics - Continue Ciprofloxacin /79ml twice a day for 7 days Wound #2 Right Ischial Tuberosity o P.O. Antibiotics - Continue Doxy  twice a day for 7 days o P.O. Antibiotics - Continue Ciprofloxacin /25ml twice a day for 7 days Radiology o  X-ray, coccyx [employer] [facility] o CBC with diff - SED rate, CMP, C-reactive Protein, CBC with diff. Home health to please obtain these labs and fax to [phone] Clyatt, DEANDRA GOERING ([id number]) Patient Medications Allergies: penicillin Notifications Medication Indication Start End doxycycline monohydrate DOSE oral 100 mg tablet - tablet oral ciprofloxacin HCl DOSE oral 500 mg tablet - tablet oral Electronic Signature(s) Signed: 10/23/2015 4:24:24 PM By: Elpidio Eric [name], RN Signed: 10/23/2015 4:33:06 PM By: Baltazar Najjar MD Entered By: Elpidio Eric on 10/23/2015 16:24:23 Odland, [name] ([id number]) -------------------------------------------------------------------------------- Prescription 10/23/2015 Patient Name: Scheidt, [name] Physician: Maxwell Caul MD Date of Birth: [birth year] NPI#: [id number] Sex: F DEA#: [id number] Phone #: [phone] License #: [id number] Patient Address: [facility] [address] [place], [place] [address] [facility] [address] [place], [place] [address] [phone] Allergies penicillin Physician's Orders P.O. Antibiotics - Continue Doxy  twice a day for 7 days Signature(s): Date(s): Colombe, JAZMINA MUHLENKAMP ([id number]) Prescription 10/23/2015 Patient Name: Macleod, NISHTHA B. Physician: Maxwell Caul MD Date of Birth: April 04, 1936 NPI#: [id number] Sex: F DEA#: [id number] Phone #: [phone] License #: [id number] Patient Address: [facility] [address] [place], [place] [address] [facility] [address], [address] [place], [place] [address] [phone] Allergies penicillin Physician's Orders P.O. Antibiotics - Continue Ciprofloxacin /49ml twice a day for 7 days Signature(s): Date(s): Nodine, KLOI BRODMAN ([id number]) Prescription 10/23/2015 Patient Name: Satter, DEIJAH B. Physician: Maxwell Caul MD Date of Birth: 1936-03-08 NPI#: [id number] Sex: F DEA#: [id number] Phone #: [phone] License #: [id number] Patient Address: [facility] [address] [place], [place] [address] [facility] [address], [address] [place], [place] [address] [phone] Allergies penicillin Medication Medication: Route: Strength: Form: doxycycline monohydrate oral 100 mg tablet Class: TETRACYCLINES Dose: Frequency / Time: Indication: tablet oral Number of Refills: Number of Units: 0 Generic Substitution: Start Date: End Date: Administered at Substitution Permitted Facility: No Note to Pharmacy: Signature(s): Date(s): Makin, KIERRA JEZEWSKI ([id number]) Prescription 10/23/2015 Patient Name: Doshier, MILIKA B. Physician: Maxwell Caul MD Date of Birth: 01-19-1936 NPI#: [id number] Sex: F DEA#: [id number] Phone #: [phone] License #: [id number] Patient Address: [facility] [address] [place],  [place] [address] [facility] [address], [address] [place], [place] [address] [phone] Allergies penicillin Medication Medication: Route: Strength: Form: ciprofloxacin HCl oral 500 mg tablet Class: QUINOLONES Dose: Frequency / Time: Indication: tablet oral Number of Refills: Number of Units: 0 Generic Substitution: Start Date: End Date: Administered at Substitution Permitted Facility: No Note to Pharmacy: Signature(s): Date(s): Brunet, ANALESE SOVINE ([id number]) Electronic Signature(s) Signed: 10/23/2015 4:33:06 PM By: Baltazar Najjar MD Entered By: Baltazar Najjar on 10/23/2015 16:31:46 Bulthuis, [name] ([id number]) --------------------------------------------------------------------------------  Problem List Details Patient Name: Ashland, Ayeza B. Date of Service: 10/23/2015 3:30 PM Medical Record Patient Account Number:  [id number] Number: Treating RN: [name], RN, [name], [name] 21-Aug-1935 [phone] y.o. Other Clinician: Date of Birth/Sex: Female) Treating ROBSON, [name] Primary Care Physician/Extender: [name], [name] Physician: Referring Physician: [name] Weeks in Treatment: 1 Active Problems ICD-10 Encounter Code Description Active Date Diagnosis L89.154 Pressure ulcer of sacral region, stage 4 10/15/2015 Yes L03.317 Cellulitis of buttock 10/15/2015 Yes E43 Unspecified severe protein-calorie malnutrition 10/15/2015 Yes L89.213 Pressure ulcer of right hip, stage 3 10/15/2015 Yes Inactive Problems Resolved Problems Electronic Signature(s) Signed: 10/23/2015 4:33:06 PM By: Baltazar Najjar MD Entered By: Baltazar Najjar on 10/23/2015 16:22:28 [place], [name] ([id number]) -------------------------------------------------------------------------------- Progress Note Details Patient Name: Face, Joseline B. Date of Service: 10/23/2015 3:30 PM Medical Record Patient Account Number: [id number] Number: Treating RN: [name], RN, [name], [name] 1935/12/20 [phone] y.o. Other Clinician: Date of Birth/Sex: Female) Treating ROBSON, [name] Primary Care Physician/Extender: [name], [name] Physician: Referring Physician: [name] Weeks in Treatment: 1 Subjective Chief Complaint Information obtained from Patient The patient is here for a sacral decubitus ulcer also a more superficial wound over her right hip History of Present Illness (HPI) 10/15/15; this is a very frail lady with advanced Alzheimer's disease who is being cared for aerobically at home by her daughter and granddaughter. She is essentially nonambulatory and seems to be minimally verbal. They tell me that she started to develop a pressure area in her sacral area in January. Initially a very small open area that progressed up. There is also a more recent area on the right hip. The patient was seen in her primary office for this  problem on 2/23 at that point noted to have a stage III wound with tunneling over the sacrum. There were 2 other areas noted on the upper left and right bilateral occipital. As well as a stage II on the right trochanter. She was referred to the ER because of tachycardia. She was hospitalized from 2/23 through 2/25. The admission this seems to open dominated by a distal fecal impaction. She was noted to have sinus tachycardia which improved. She did have a CT scan of the abdomen and pelvis during the hospitalization. In addition to the fecal impaction this noted the ulcer in the left lower gluteal region adjacent to the midline. There was air in the subcutaneous wound but no evidence of an air-fluid level to suggest an abscess. There was no comment about osteomyelitis. Since her return home she has not been eating well but is drinking. The family provides total care. I note that her albumin was 3 on admission to the hospital. Sodium got as high as 148 but was normalized by the time she left 10/24/15; this is a patient who has advanced dementia. She has a deep probing stage IV pressure ulcer over her lower sacral area. Cultures of this last week grew both Proteus and methicillin-resistant staph aureus. I and empirically put her  on Cipro and doxycycline which should cover both of these. He also has a wound over her right greater trochanter. She is been followed by home health at home they're requesting lab work which I think is reasonable. I ordered a CMP, CBC with differential, sedimentation rate and a C-reactive protein Objective Constitutional Schweikert, Latarra B. ([id number]) Vitals Time Taken: 3:51 PM, Height: 63 in, Temperature: 98.3 F, Pulse: 100 bpm, Respiratory Rate: 16 breaths/min, Blood Pressure: 117/83 mmHg. Integumentary (Hair, Skin) Wound #1 status is Open. Original cause of wound was Gradually Appeared. The wound is located on the Sacrum. The wound measures 5.5cm length x 4.5cm width  x 4cm depth; 19.439cm^2 area and 77.754cm^3 volume. The wound is limited to skin breakdown. There is no tunneling or undermining noted. There is a large amount of serosanguineous drainage noted. The wound margin is thickened. There is medium (34- 66%) granulation within the wound bed. There is a medium (34-66%) amount of necrotic tissue within the wound bed including Adherent Slough. The periwound skin appearance exhibited: Moist. The periwound skin appearance did not exhibit: Callus, Crepitus, Excoriation, Fluctuance, Friable, Induration, Localized Edema, Rash, Scarring, Dry/Scaly, Maceration, Atrophie Blanche, Cyanosis, Ecchymosis, Hemosiderin Staining, Mottled, Pallor, Rubor, Erythema. Periwound temperature was noted as No Abnormality. The periwound has tenderness on palpation. Wound #2 status is Open. Original cause of wound was Gradually Appeared. The wound is located on the Right Ischial Tuberosity. The wound measures 1cm length x 1cm width x 0.1cm depth; 0.785cm^2 area and 0.079cm^3 volume. The wound is limited to skin breakdown. There is no tunneling or undermining noted. There is a medium amount of serosanguineous drainage noted. The wound margin is distinct with the outline attached to the wound base. There is medium (34-66%) granulation within the wound bed. There is a medium (34-66%) amount of necrotic tissue within the wound bed including Adherent Slough. The periwound skin appearance exhibited: Moist. The periwound skin appearance did not exhibit: Callus, Crepitus, Excoriation, Fluctuance, Friable, Induration, Localized Edema, Rash, Scarring, Dry/Scaly, Maceration, Atrophie Blanche, Cyanosis, Ecchymosis, Hemosiderin Staining, Mottled, Pallor, Rubor, Erythema. Periwound temperature was noted as No Abnormality. The periwound has tenderness on palpation. Assessment Active Problems ICD-10 L89.154 - Pressure ulcer of sacral region, stage 4 L03.317 - Cellulitis of buttock E43 -  Unspecified severe protein-calorie malnutrition L89.213 - Pressure ulcer of right hip, stage 3 Procedures Wound #1 Wound #1 is a Pressure Ulcer located on the Sacrum . There was a Skin/Subcutaneous Tissue Zanella, Mirca B. ([id number]) Debridement ([id number]) debridement with total area of 24.75 sq cm performed by Maxwell Caul, MD. with the following instrument(s): Blade and Forceps to remove Non-Viable tissue/material including Fibrin/Slough and Subcutaneous after achieving pain control using Lidocaine 4% Topical Solution. A time out was conducted prior to the start of the procedure. A Moderate amount of bleeding was controlled with Pressure. The procedure was tolerated well with a pain level of 0 throughout and a pain level of 0 following the procedure. Post Debridement Measurements: 5.5cm length x 4.5cm width x 4cm depth; 77.754cm^3 volume. Post debridement Stage noted as Category/Stage IV. Post procedure Diagnosis Wound #1: Same as Pre-Procedure Wound #2 Wound #2 is a Pressure Ulcer located on the Right Ischial Tuberosity . There was a Skin/Subcutaneous Tissue Debridement ([id number]) debridement with total area of 1 sq cm performed by Maxwell Caul, MD. with the following instrument(s): Curette to remove Non-Viable tissue/material including Fibrin/Slough and Subcutaneous after achieving pain control using Lidocaine 4% Topical Solution. A time out was conducted prior to the start  of the procedure. A Moderate amount of bleeding was controlled with Pressure. The procedure was tolerated well with a pain level of 0 throughout and a pain level of 0 following the procedure. Post Debridement Measurements: 1cm length x 1cm width x 0.2cm depth; 0.157cm^3 volume. Post debridement Stage noted as Category/Stage II. Post procedure Diagnosis Wound #2: Same as Pre-Procedure Plan Wound Cleansing: Wound #1 Sacrum: Clean wound with Normal Saline. Wound #2 Right Ischial Tuberosity: Clean  wound with Normal Saline. Anesthetic: Wound #1 Sacrum: Topical Lidocaine 4% cream applied to wound bed prior to debridement Wound #2 Right Ischial Tuberosity: Topical Lidocaine 4% cream applied to wound bed prior to debridement Skin Barriers/Peri-Wound Care: Wound #1 Sacrum: Barrier cream Wound #2 Right Ischial Tuberosity: Barrier cream Primary Wound Dressing: Wound #1 Sacrum: Aquacel Ag Wound #2 Right Ischial Tuberosity: Aquacel Ag Secondary Dressing: Wound #1 Sacrum: Steppe, Vanesa B. ([id number]) Boardered Foam Dressing Wound #2 Right Ischial Tuberosity: Boardered Foam Dressing Dressing Change Frequency: Wound #1 Sacrum: Change dressing every other day. - may change daily as needed for excess drainage Wound #2 Right Ischial Tuberosity: Change dressing every other day. - may change daily as needed for excess drainage Follow-up Appointments: Wound #1 Sacrum: Return Appointment in 1 week. Wound #2 Right Ischial Tuberosity: Return Appointment in 1 week. Off-Loading: Wound #1 Sacrum: Roho cushion for wheelchair - To be ordered by Home health Turn and reposition every 2 hours [facility] bed ordered by [facility] Wound #2 Right Ischial Tuberosity: Roho cushion for wheelchair - To be ordered by Home health Turn and reposition every 2 hours Mattress - Hospital bed ordered by [facility] Additional Orders / Instructions: Wound #1 Sacrum: Increase protein intake. Other: - Supplements: Vitamin C, MVI, Zinc Wound #2 Right Ischial Tuberosity: Increase protein intake. Other: - Supplements: Vitamin C, MVI, Zinc Home Health: Wound #1 Sacrum: Continue Home Health Visits - [facility] Home Health Nurse may visit PRN to address patient s wound care needs. FACE TO FACE ENCOUNTER: MEDICARE and MEDICAID PATIENTS: I certify that this patient is under my care and that I had a face-to-face encounter that meets the physician face-to-face  encounter requirements with this patient on this date. The encounter with the patient was in whole or in part for the following MEDICAL CONDITION: (primary reason for Home Healthcare) MEDICAL NECESSITY: I certify, that based on my findings, NURSING services are a medically necessary home health service. HOME BOUND STATUS: I certify that my clinical findings support that this patient is homebound (i.e., Due to illness or injury, pt requires aid of supportive devices such as crutches, cane, wheelchairs, walkers, the use of special transportation or the assistance of another person to leave their place of residence. There is a normal inability to leave the home and doing so requires considerable and taxing effort. Other absences are for medical reasons / religious services and are infrequent or of short duration when for other reasons). If current dressing causes regression in wound condition, may D/C ordered dressing product/s and apply Normal Saline Moist Dressing daily until next Wound Healing Center / Other MD appointment. Notify Wound Healing Center of regression in wound condition at [phone]. Please direct any NON-WOUND related issues/requests for orders to patient's Primary Care Physician Wound #2 Right Ischial Tuberosity: Continue Home Health Visits - [facility] Home Health Nurse may visit PRN to address patient s wound care needs. FACE TO FACE ENCOUNTER: MEDICARE and MEDICAID PATIENTS: I certify that this patient is under  Aquilino, [name] ([id number]) my care and that I had a face-to-face encounter that meets the physician face-to-face encounter requirements with this patient on this date. The encounter with the patient was in whole or in part for the following MEDICAL CONDITION: (primary reason for Home Healthcare) MEDICAL NECESSITY: I certify, that based on my findings, NURSING services are a medically necessary home health service. HOME BOUND STATUS: I certify that my  clinical findings support that this patient is homebound (i.e., Due to illness or injury, pt requires aid of supportive devices such as crutches, cane, wheelchairs, walkers, the use of special transportation or the assistance of another person to leave their place of residence. There is a normal inability to leave the home and doing so requires considerable and taxing effort. Other absences are for medical reasons / religious services and are infrequent or of short duration when for other reasons). If current dressing causes regression in wound condition, may D/C ordered dressing product/s and apply Normal Saline Moist Dressing daily until next Wound Healing Center / Other MD appointment. Notify Wound Healing Center of regression in wound condition at [phone]. Please direct any NON-WOUND related issues/requests for orders to patient's Primary Care Physician Medications-please add to medication list.: Wound #1 Sacrum: P.O. Antibiotics - Continue Doxy 100mg  twice a day for 7 days P.O. Antibiotics - Continue Ciprofloxacin 500mg /955ml twice a day for 7 days Wound #2 Right Ischial Tuberosity: P.O. Antibiotics - Continue Doxy 100mg  twice a day for 7 days P.O. Antibiotics - Continue Ciprofloxacin 500mg /345ml twice a day for 7 days ordered were: CBC with diff - SED rate, CMP, C-reactive Protein, CBC with diff. Home health to please obtain these labs and fax to [phone] Radiology ordered were: X-ray, coccyx The following medication(s) was prescribed: doxycycline monohydrate oral 100 mg tablet tablet oral ciprofloxacin HCl oral 500 mg tablet tablet oral #1 we are going to continue Aquacel Ag to the wound areas. #2 I think we do need to go ahead and get a plain x-ray. I think I could probably get a bone biopsy from the sacral area. I would be surprised if she does not have osteomyelitis. 3 I am doubtful that we can get an MRI of this area due to the severity of the patient's dementia and  inability to lie still. 4 I renewed her ciprofloxacin and doxycycline to cover Proteus and the MRSA. Electronic Signature(s) Signed: 10/23/2015 4:33:06 PM By: Baltazar Najjarobson, [name] MD Entered By: Baltazar Najjarobson, [name] on 10/23/2015 16:31:21 Levitz, [name] ([id number]) -------------------------------------------------------------------------------- SuperBill Details Patient Name: Fils, [name] Date of Service: 10/23/2015 Medical Record Patient Account Number: [id number] Number: Treating RN: Clover MealyAfful, RN, [name], [name] 10/08/1935 [phone](79 y.o. Other Clinician: Date of Birth/Sex: Female) Treating ROBSON, [name] Primary Care Physician/Extender: Marlana LatusG SINGH, [name] Physician: Weeks in Treatment: 1 Referring Physician: [name] Diagnosis Coding ICD-10 Codes Code Description L89.154 Pressure ulcer of sacral region, stage 4 L03.317 Cellulitis of buttock E43 Unspecified severe protein-calorie malnutrition L89.213 Pressure ulcer of right hip, stage 3 Facility Procedures CPT4 Code: [id number] Description: 11042 - DEB SUBQ TISSUE 20 SQ CM/< ICD-10 Description Diagnosis L89.154 Pressure ulcer of sacral region, stage 4 Modifier: Quantity: 1 CPT4 Code: [id number] Description: 11045 - DEB SUBQ TISS EA ADDL 20CM ICD-10 Description Diagnosis L89.213 Pressure ulcer of right hip, stage 3 Modifier: Quantity: 1 Physician Procedures CPT4 Code: [id number] Description: 11042 - WC PHYS SUBQ TISS 20 SQ CM ICD-10 Description Diagnosis L89.154 Pressure ulcer of sacral region, stage 4 Modifier: Quantity: 1 CPT4 Code: [id number] [facility]  Description: 11045 - WC PHYS SUBQ TISS EA ADDL 20 CM ICD-10 Description Diagnosis L89.213 Pressure ulcer of right hip, stage 3 B. ([id number]) Modifier: Quantity: 1 Electronic Signature(s) Signed: 10/23/2015 4:33:06 PM By: Baltazar Najjar MD Entered By: Baltazar Najjar on 10/23/2015 16:31:39

## 2015-10-25 NOTE — Progress Notes (Signed)
[name], [name] ([id number]) Visit Report for 10/23/2015 Arrival Information Details Patient Name: [name], [name] Date of Service: 10/23/2015 3:30 PM Medical Record Patient Account Number: [id number] Number: Treating RN: [name] Mealy, RN, [name], [name] 08/22/1935 [phone] y.o. Other Clinician: Date of Birth/Sex: Female) Treating [name] Primary Care Physician: [name] Physician/Extender: G Referring Physician: Thedore [name], [name] in Treatment: 1 Visit Information History Since Last Visit Added or deleted any medications: No Patient Arrived: Wheel Chair Any new allergies or adverse reactions: No Arrival Time: 15:47 Had a fall or experienced change in No Accompanied By: dtr, grd dtr activities of daily living that may affect Transfer Assistance: EasyPivot risk of falls: Patient Lift Signs or symptoms of abuse/neglect since last No Patient Identification Verified: Yes visito Secondary Verification Process Yes Hospitalized since last visit: No Completed: Has Dressing in Place as Prescribed: Yes Patient Requires Transmission- No Pain Present Now: No Based Precautions: Patient Has Alerts: No Electronic Signature(s) Signed: 10/23/2015 5:40:11 PM By: [name] Eric [name], RN Entered By: [name] Eric on 10/23/2015 15:50:22 [name], [name] ([id number]) -------------------------------------------------------------------------------- Encounter Discharge Information Details Patient Name: [name], [name] Date of Service: 10/23/2015 3:30 PM Medical Record Patient Account Number: [id number] Number: Treating RN: [name] Mealy, RN, [name], [name] [birth year] [phone] y.o. Other Clinician: Date of Birth/Sex: Female) Treating [name] Primary Care Physician: [name] Physician/Extender: G Referring Physician: Leotis [name] [name] in Treatment: 1 Encounter Discharge Information Items Schedule Follow-up Appointment: No Medication Reconciliation completed No and  provided to Patient/Care Leaann Nevils: Provided on Clinical Summary of Care: 10/23/2015 Form Type Recipient Paper Patient [facility] Electronic Signature(s) Signed: 10/23/2015 4:41:39 PM By: Gwenlyn [name] Entered By: Gwenlyn [name] on 10/23/2015 16:41:39 [name], [name] ([id number]) -------------------------------------------------------------------------------- Multi Wound Chart Details Patient Name: Andy, [name] Date of Service: 10/23/2015 3:30 PM Medical Record Patient Account Number: [id number] Number: Treating RN: [name] Mealy, RN, [name], [name] 06-12-1936 [phone] y.o. Other Clinician: Date of Birth/Sex: Female) Treating [name] Primary Care Physician: [name] Physician/Extender: G Referring Physician: Thedore [name], [name] in Treatment: 1 Vital Signs Height(in): 63 Pulse(bpm): 100 Weight(lbs): Blood Pressure 117/83 (mmHg): Body Mass Index(BMI): Temperature(F): 98.3 Respiratory Rate 16 (breaths/min): Photos: [1:No Photos] [2:No Photos] [N/A:N/A] Wound Location: [1:Sacrum] [2:Right Ischial Tuberosity N/A] Wounding Event: [1:Gradually Appeared] [2:Gradually Appeared] [N/A:N/A] Primary Etiology: [1:Pressure Ulcer] [2:Pressure Ulcer] [N/A:N/A] Comorbid History: [1:Cataracts, Anemia, Arrhythmia, Hypertension, Arrhythmia, Hypertension, Myocardial Infarction, History of pressure wounds, Osteoarthritis, Dementia] [2:Cataracts, Anemia, Myocardial Infarction, History of pressure wounds,  Osteoarthritis, Dementia] [N/A:N/A] Date Acquired: [1:09/10/2015] [2:09/10/2015] [N/A:N/A] [name] of Treatment: [1:1] [2:1] [N/A:N/A] Wound Status: [1:Open] [2:Open] [N/A:N/A] Measurements L x W x D 5.5x4.5x4 [2:1x1x0.1] [N/A:N/A] (cm) Area (cm) : [1:19.439] [2:0.785] [N/A:N/A] Volume (cm) : [1:77.754] [2:0.079] [N/A:N/A] % Reduction in Area: [1:-28.90%] [2:0.00%] [N/A:N/A] % Reduction in Volume: -14.60% [2:0.00%] [N/A:N/A] Classification: [1:Category/Stage IV] [2:Category/Stage II]  [N/A:N/A] Exudate Amount: [1:Large] [2:Medium] [N/A:N/A] Exudate Type: [1:Serosanguineous] [2:Serosanguineous] [N/A:N/A] Exudate Color: [1:red, brown] [2:red, brown] [N/A:N/A] Foul Odor After [1:Yes] [2:No] [N/A:N/A] Cleansing: Odor Anticipated Due to No [2:N/A] [N/A:N/A] Product Use: Wound Margin: [1:Thickened] [2:Distinct, outline attached N/A] Granulation Amount: Medium (34-66%) Medium (34-66%) N/A Necrotic Amount: Medium (34-66%) Medium (34-66%) N/A Exposed Structures: Fascia: No Fascia: No N/A Fat: No Fat: No Tendon: No Tendon: No Muscle: No Muscle: No Joint: No Joint: No Bone: No Bone: No Limited to Skin Limited to Skin Breakdown Breakdown Epithelialization: None None N/A Periwound Skin Texture: Edema: No Edema: No N/A Excoriation: No Excoriation: No Induration: No Induration: No Callus: No Callus: No Crepitus: No Crepitus: No Fluctuance:  No Fluctuance: No Friable: No Friable: No Rash: No Rash: No Scarring: No Scarring: No Periwound Skin Moist: Yes Moist: Yes N/A Moisture: Maceration: No Maceration: No Dry/Scaly: No Dry/Scaly: No Periwound Skin Color: Atrophie Blanche: No Atrophie Blanche: No N/A Cyanosis: No Cyanosis: No Ecchymosis: No Ecchymosis: No Erythema: No Erythema: No Hemosiderin Staining: No Hemosiderin Staining: No Mottled: No Mottled: No Pallor: No Pallor: No Rubor: No Rubor: No Temperature: No Abnormality No Abnormality N/A Tenderness on Yes Yes N/A Palpation: Wound Preparation: Ulcer Cleansing: Ulcer Cleansing: N/A Rinsed/Irrigated with Rinsed/Irrigated with Saline Saline Topical Anesthetic Topical Anesthetic Applied: Other: lidocaine Applied: Other: lidocaine 4% 4% Treatment Notes Electronic Signature(s) Signed: 10/23/2015 5:40:11 PM By: [name] [name], RN Entered By: [name] on 10/23/2015 16:15:45 [name], [name]  ([id number]) -------------------------------------------------------------------------------- Multi-Disciplinary Care Plan Details Patient Name: [name], [name] Date of Service: 10/23/2015 3:30 PM Medical Record Patient Account Number: [id number] Number: Treating RN: [name] MealyAfful, RN, [name], [name] 03/31/1936 [phone](79 y.o. Other Clinician: Date of Birth/Sex: Female) Treating [name] Primary Care Physician: [name] ShamesSINGH, [name] Physician/Extender: G Referring Physician: Thedore MinsSINGH, [name] in Treatment: 1 Active Inactive Abuse / Safety / Falls / Self Care Management Nursing Diagnoses: Impaired home maintenance Impaired physical mobility Knowledge deficit related to: safety; personal, health (wound), emergency Potential for falls Self care deficit: actual or potential Goals: Patient will remain injury free Date Initiated: 10/15/2015 Goal Status: Active Patient/caregiver will verbalize understanding of skin care regimen Date Initiated: 10/15/2015 Goal Status: Active Patient/caregiver will verbalize/demonstrate measure taken to improve self care Date Initiated: 10/15/2015 Goal Status: Active Patient/caregiver will verbalize/demonstrate measures taken to improve the patient's personal safety Date Initiated: 10/15/2015 Goal Status: Active Patient/caregiver will verbalize/demonstrate measures taken to prevent injury and/or falls Date Initiated: 10/15/2015 Goal Status: Active Patient/caregiver will verbalize/demonstrate understanding of what to do in case of emergency Date Initiated: 10/15/2015 Goal Status: Active Interventions: Assess fall risk on admission and as needed Assess: immobility, friction, shearing, incontinence upon admission and as needed Assess impairment of mobility on admission and as needed per policy [place], Kyann B. ([id number]) Assess self care needs on admission and as needed Provide education on basic hygiene Provide education on fall prevention Provide  education on personal and home safety Provide education on safe transfers Treatment Activities: Education provided on Basic Hygiene : 10/15/2015 Patient referred to home care : 10/23/2015 Notes: Nutrition Nursing Diagnoses: Imbalanced nutrition Impaired glucose control: actual or potential Potential for alteratiion in Nutrition/Potential for imbalanced nutrition Goals: Patient/caregiver agrees to and verbalizes understanding of need to obtain nutritional consultation Date Initiated: 10/15/2015 Goal Status: Active Patient/caregiver agrees to and verbalizes understanding of need to use nutritional supplements and/or vitamins as prescribed Date Initiated: 10/15/2015 Goal Status: Active Patient/caregiver verbalizes understanding of need to maintain therapeutic glucose control per primary care physician Date Initiated: 10/15/2015 Goal Status: Active Interventions: Assess patient nutrition upon admission and as needed per policy Provide education on elevated blood sugars and impact on wound healing Provide education on nutrition Treatment Activities: Education provided on Nutrition : 10/15/2015 Notes: Orientation to the Wound Care Program Nursing Diagnoses: Knowledge deficit related to the wound healing center program [name], [name] ([id number]) Goals: Patient/caregiver will verbalize understanding of the Wound Healing Center Program Date Initiated: 10/15/2015 Goal Status: Active Interventions: Provide education on orientation to the wound center Notes: Pressure Nursing Diagnoses: Knowledge deficit related to causes and risk factors for pressure ulcer development Knowledge deficit related to management of pressures ulcers Potential for impaired tissue integrity related to pressure, friction, moisture, and shear  Goals: Patient will remain free from development of additional pressure ulcers Date Initiated: 10/15/2015 Goal Status: Active Patient will remain free of pressure  ulcers Date Initiated: 10/15/2015 Goal Status: Active Patient/caregiver will verbalize risk factors for pressure ulcer development Date Initiated: 10/15/2015 Goal Status: Active Patient/caregiver will verbalize understanding of pressure ulcer management Date Initiated: 10/15/2015 Goal Status: Active Interventions: Assess: immobility, friction, shearing, incontinence upon admission and as needed Assess offloading mechanisms upon admission and as needed Assess potential for pressure ulcer upon admission and as needed Provide education on pressure ulcers Treatment Activities: Patient referred for home evaluation of offloading devices/mattresses : 10/23/2015 Patient referred for pressure reduction/relief devices : 10/23/2015 Patient referred for seating evaluation to ensure proper offloading : 10/23/2015 Pressure reduction/relief device ordered : 10/23/2015 Notes: [name], [name] ([id number]) Wound/Skin Impairment Nursing Diagnoses: Impaired tissue integrity Knowledge deficit related to smoking impact on wound healing Knowledge deficit related to ulceration/compromised skin integrity Goals: Patient/caregiver will verbalize understanding of skin care regimen Date Initiated: 10/15/2015 Goal Status: Active Ulcer/skin breakdown will have a volume reduction of 30% by week 4 Date Initiated: 10/15/2015 Goal Status: Active Ulcer/skin breakdown will have a volume reduction of 50% by week 8 Date Initiated: 10/15/2015 Goal Status: Active Ulcer/skin breakdown will have a volume reduction of 80% by week 12 Date Initiated: 10/15/2015 Goal Status: Active Ulcer/skin breakdown will heal within 14 [name] Date Initiated: 10/15/2015 Goal Status: Active Interventions: Assess patient/caregiver ability to obtain necessary supplies Assess patient/caregiver ability to perform ulcer/skin care regimen upon admission and as needed Assess ulceration(s) every visit Provide education on ulcer and skin  care Treatment Activities: Patient referred to home care : 10/23/2015 Skin care regimen initiated : 10/23/2015 Notes: Electronic Signature(s) Signed: 10/23/2015 5:40:11 PM By: [name] Eric [name], RN Entered By: [name] Eric on 10/23/2015 16:00:50 [name], [name] ([id number]) -------------------------------------------------------------------------------- Pain Assessment Details Patient Name: [name], [name] Date of Service: 10/23/2015 3:30 PM Medical Record Patient Account Number: [id number] Number: Treating RN: [name] Mealy, RN, [name], [name] 1935-09-14 [phone] y.o. Other Clinician: Date of Birth/Sex: Female) Treating [name] Primary Care Physician: [name] Physician/Extender: G Referring Physician: Thedore [name], [name] in Treatment: 1 Active Problems Location of Pain Severity and Description of Pain Patient Has Paino No Site Locations Pain Management and Medication Current Pain Management: Electronic Signature(s) Signed: 10/23/2015 5:40:11 PM By: [name] Eric [name], RN Entered By: [name] Eric on 10/23/2015 15:50:31 [name], [name] ([id number]) -------------------------------------------------------------------------------- Wound Assessment Details Patient Name: Niznik, [name] Date of Service: 10/23/2015 3:30 PM Medical Record Patient Account Number: [id number] Number: Treating RN: [name] Mealy, RN, [name], [name] [birth year] [phone] y.o. Other Clinician: Date of Birth/Sex: Female) Treating [name] Primary Care Physician: [name] Physician/Extender: G Referring Physician: Thedore [name], [name] in Treatment: 1 Wound Status Wound Number: 1 Primary Pressure Ulcer Etiology: Wound Location: Sacrum Wound Open Wounding Event: Gradually Appeared Status: Date Acquired: 09/10/2015 Comorbid Cataracts, Anemia, Arrhythmia, [name] Of Treatment: 1 History: Hypertension, Myocardial Infarction, Clustered Wound: No History of pressure wounds, Osteoarthritis,  Dementia Photos Photo Uploaded By: [name] Eric on 10/23/2015 17:35:03 Wound Measurements Length: (cm) 5.5 Width: (cm) 4.5 Depth: (cm) 4 Area: (cm) 19.439 Volume: (cm) 77.754 % Reduction in Area: -28.9% % Reduction in Volume: -14.6% Epithelialization: None Tunneling: No Undermining: No Wound Description Classification: Category/Stage IV Wound Margin: Thickened Exudate Amount: Large Exudate Type: Serosanguineous Exudate Color: red, brown Foul Odor After Cleansing: Yes Due to Product Use: No Wound Bed Granulation Amount: Medium (34-66%) Exposed Structure Klink, Naomy B. ([id number]) Necrotic Amount: Medium (34-66%) Fascia  Exposed: No Necrotic Quality: Adherent Slough Fat Layer Exposed: No Tendon Exposed: No Muscle Exposed: No Joint Exposed: No Bone Exposed: No Limited to Skin Breakdown Periwound Skin Texture Texture Color No Abnormalities Noted: No No Abnormalities Noted: No Callus: No Atrophie Blanche: No Crepitus: No Cyanosis: No Excoriation: No Ecchymosis: No Fluctuance: No Erythema: No Friable: No Hemosiderin Staining: No Induration: No Mottled: No Localized Edema: No Pallor: No Rash: No Rubor: No Scarring: No Temperature / Pain Moisture Temperature: No Abnormality No Abnormalities Noted: No Tenderness on Palpation: Yes Dry / Scaly: No Maceration: No Moist: Yes Wound Preparation Ulcer Cleansing: Rinsed/Irrigated with Saline Topical Anesthetic Applied: Other: lidocaine 4%, Electronic Signature(s) Signed: 10/23/2015 5:40:11 PM By: [name] Eric [name], RN Entered By: [name] Eric on 10/23/2015 16:00:22 Stalvey, [name] ([id number]) -------------------------------------------------------------------------------- Wound Assessment Details Patient Name: Velazquez, [name] Date of Service: 10/23/2015 3:30 PM Medical Record Patient Account Number: [id number] Number: Treating RN: [name] Mealy, RN, [name], [name] 29-Sep-1935 [phone] y.o. Other  Clinician: Date of Birth/Sex: Female) Treating [name] Primary Care Physician: [name] Physician/Extender: G Referring Physician: Thedore [name], [name] in Treatment: 1 Wound Status Wound Number: 2 Primary Pressure Ulcer Etiology: Wound Location: Right Ischial Tuberosity Wound Open Wounding Event: Gradually Appeared Status: Date Acquired: 09/10/2015 Comorbid Cataracts, Anemia, Arrhythmia, [name] Of Treatment: 1 History: Hypertension, Myocardial Infarction, Clustered Wound: No History of pressure wounds, Osteoarthritis, Dementia Photos Photo Uploaded By: [name] Eric on 10/23/2015 17:35:40 Wound Measurements Length: (cm) 1 Width: (cm) 1 Depth: (cm) 0.1 Area: (cm) 0.785 Volume: (cm) 0.079 % Reduction in Area: 0% % Reduction in Volume: 0% Epithelialization: None Tunneling: No Undermining: No Wound Description Classification: Category/Stage II Wound Margin: Distinct, outline attached Exudate Amount: Medium Exudate Type: Serosanguineous Exudate Color: red, brown Foul Odor After Cleansing: No Wound Bed Granulation Amount: Medium (34-66%) Exposed Structure Reidinger, Axie B. ([id number]) Necrotic Amount: Medium (34-66%) Fascia Exposed: No Necrotic Quality: Adherent Slough Fat Layer Exposed: No Tendon Exposed: No Muscle Exposed: No Joint Exposed: No Bone Exposed: No Limited to Skin Breakdown Periwound Skin Texture Texture Color No Abnormalities Noted: No No Abnormalities Noted: No Callus: No Atrophie Blanche: No Crepitus: No Cyanosis: No Excoriation: No Ecchymosis: No Fluctuance: No Erythema: No Friable: No Hemosiderin Staining: No Induration: No Mottled: No Localized Edema: No Pallor: No Rash: No Rubor: No Scarring: No Temperature / Pain Moisture Temperature: No Abnormality No Abnormalities Noted: No Tenderness on Palpation: Yes Dry / Scaly: No Maceration: No Moist: Yes Wound Preparation Ulcer Cleansing: Rinsed/Irrigated with  Saline Topical Anesthetic Applied: Other: lidocaine 4%, Electronic Signature(s) Signed: 10/23/2015 5:40:11 PM By: [name] Eric [name], RN Entered By: [name] Eric on 10/23/2015 16:00:40 Barlett, [name] ([id number]) -------------------------------------------------------------------------------- Vitals Details Patient Name: Pelly, [name] Date of Service: 10/23/2015 3:30 PM Medical Record Patient Account Number: [id number] Number: Treating RN: [name] Mealy, RN, [name], [name] 09-16-1935 [phone] y.o. Other Clinician: Date of Birth/Sex: Female) Treating [name] Primary Care Physician: [name] Physician/Extender: G Referring Physician: Thedore [name], [name] in Treatment: 1 Vital Signs Time Taken: 15:51 Temperature (F): 98.3 Height (in): 63 Pulse (bpm): 100 Respiratory Rate (breaths/min): 16 Blood Pressure (mmHg): 117/83 Reference Range: 80 - 120 mg / dl Electronic Signature(s) Signed: 10/23/2015 5:40:11 PM By: [name] Eric [name], RN Entered By: [name] Eric on 10/23/2015 15:52:34

## 2015-10-28 DIAGNOSIS — L89159 Pressure ulcer of sacral region, unspecified stage: Secondary | ICD-10-CM | POA: Diagnosis not present

## 2015-10-28 DIAGNOSIS — L89620 Pressure ulcer of left heel, unstageable: Secondary | ICD-10-CM | POA: Diagnosis not present

## 2015-10-28 DIAGNOSIS — L89150 Pressure ulcer of sacral region, unstageable: Secondary | ICD-10-CM | POA: Diagnosis not present

## 2015-10-30 ENCOUNTER — Encounter: Payer: Medicare Other | Admitting: Internal Medicine

## 2015-10-30 DIAGNOSIS — L03317 Cellulitis of buttock: Secondary | ICD-10-CM | POA: Diagnosis not present

## 2015-10-30 DIAGNOSIS — I10 Essential (primary) hypertension: Secondary | ICD-10-CM | POA: Diagnosis not present

## 2015-10-30 DIAGNOSIS — I252 Old myocardial infarction: Secondary | ICD-10-CM | POA: Diagnosis not present

## 2015-10-30 DIAGNOSIS — M1990 Unspecified osteoarthritis, unspecified site: Secondary | ICD-10-CM | POA: Diagnosis not present

## 2015-10-30 DIAGNOSIS — L89213 Pressure ulcer of right hip, stage 3: Secondary | ICD-10-CM | POA: Diagnosis not present

## 2015-10-30 DIAGNOSIS — G309 Alzheimer's disease, unspecified: Secondary | ICD-10-CM | POA: Diagnosis not present

## 2015-10-30 DIAGNOSIS — L89212 Pressure ulcer of right hip, stage 2: Secondary | ICD-10-CM | POA: Diagnosis not present

## 2015-10-30 DIAGNOSIS — Z880 Allergy status to penicillin: Secondary | ICD-10-CM | POA: Diagnosis not present

## 2015-10-30 DIAGNOSIS — E43 Unspecified severe protein-calorie malnutrition: Secondary | ICD-10-CM | POA: Diagnosis not present

## 2015-10-30 DIAGNOSIS — D649 Anemia, unspecified: Secondary | ICD-10-CM | POA: Diagnosis not present

## 2015-10-30 DIAGNOSIS — L89154 Pressure ulcer of sacral region, stage 4: Secondary | ICD-10-CM | POA: Diagnosis not present

## 2015-10-30 DIAGNOSIS — Z87891 Personal history of nicotine dependence: Secondary | ICD-10-CM | POA: Diagnosis not present

## 2015-10-31 NOTE — Progress Notes (Signed)
[name], [name] ([id number]) Visit Report for 10/30/2015 Arrival Information Details Patient Name: [name], [name] Date of Service: 10/30/2015 3:30 PM Medical Record Patient Account Number: [id number] Number: Treating RN: Curtis Sites Sep 18, 1935 (79 y.o. Other Clinician: Date of Birth/Sex: Female) Treating ROBSON, [name] Primary Care Physician: [name] Physician/Extender: G Referring Physician: [name] Weeks in Treatment: 2 Visit Information History Since Last Visit Added or deleted any medications: No Patient Arrived: Wheel Chair Any new allergies or adverse reactions: No Arrival Time: 16:06 Had a fall or experienced change in No activities of daily living that may affect Accompanied By: family risk of falls: Transfer Assistance: Manual Signs or symptoms of abuse/neglect since last No Patient Identification Verified: Yes visito Secondary Verification Process Yes Hospitalized since last visit: No Completed: Pain Present Now: No Patient Requires Transmission-Based No Precautions: Patient Has Alerts: No Electronic Signature(s) Signed: 10/30/2015 6:07:44 PM By: Curtis Sites Entered By: Curtis Sites on 10/30/2015 16:06:31 [name] ([id number]) -------------------------------------------------------------------------------- Encounter Discharge Information Details Patient Name: [name], [name] Date of Service: 10/30/2015 3:30 PM Medical Record Patient Account Number: [id number] Number: Treating RN: Curtis Sites [birth year] (79 y.o. Other Clinician: Date of Birth/Sex: Female) Treating ROBSON, [name] Primary Care Physician: [name] Physician/Extender: G Referring Physician: [name] Weeks in Treatment: 2 Encounter Discharge Information Items Discharge Pain Level: 0 Discharge Condition: Stable Ambulatory Status: Wheelchair Discharge Destination: Home Transportation: Private Auto Accompanied By:  family Schedule Follow-up Appointment: Yes Medication Reconciliation completed and provided to Patient/Care No Sky Borboa: Provided on Clinical Summary of Care: 10/30/2015 Form Type Recipient Paper Patient Ann Maki Electronic Signature(s) Signed: 10/30/2015 5:11:28 PM By: Gwenlyn Perking Entered By: Gwenlyn Perking on 10/30/2015 17:11:28 [name] ([id number]) -------------------------------------------------------------------------------- Multi Wound Chart Details Patient Name: [name], [name] Date of Service: 10/30/2015 3:30 PM Medical Record Patient Account Number: [id number] Number: Treating RN: Curtis Sites April 30, 1936 (79 y.o. Other Clinician: Date of Birth/Sex: Female) Treating ROBSON, [name] Primary Care Physician: [name] Physician/Extender: G Referring Physician: Thedore Mins, [name] Weeks in Treatment: 2 Vital Signs Height(in): 63 Pulse(bpm): 141 Weight(lbs): Blood Pressure 107/76 (mmHg): Body Mass Index(BMI): Temperature(F): Respiratory Rate 16 (breaths/min): Photos: [1:No Photos] [2:No Photos] [N/A:N/A] Wound Location: [1:Sacrum] [2:Right Ischial Tuberosity N/A] Wounding Event: [1:Gradually Appeared] [2:Gradually Appeared] [N/A:N/A] Primary Etiology: [1:Pressure Ulcer] [2:Pressure Ulcer] [N/A:N/A] Comorbid History: [1:Cataracts, Anemia, Arrhythmia, Hypertension, Arrhythmia, Hypertension, Myocardial Infarction, History of pressure wounds, Osteoarthritis, Dementia] [2:Cataracts, Anemia, Myocardial Infarction, History of pressure wounds,  Osteoarthritis, Dementia] [N/A:N/A] Date Acquired: [1:09/10/2015] [2:09/10/2015] [N/A:N/A] Weeks of Treatment: [1:2] [2:2] [N/A:N/A] Wound Status: [1:Open] [2:Open] [N/A:N/A] Measurements L x W x D 5.1x4x3.5 [2:1x1.2x0.2] [N/A:N/A] (cm) Area (cm) : [1:16.022] [2:0.942] [N/A:N/A] Volume (cm) : [1:56.077] [2:0.188] [N/A:N/A] % Reduction in Area: [1:-6.20%] [2:-20.00%] [N/A:N/A] % Reduction in Volume: 17.40%  [2:-138.00%] [N/A:N/A] Classification: [1:Category/Stage IV] [2:Category/Stage II] [N/A:N/A] Exudate Amount: [1:Large] [2:Medium] [N/A:N/A] Exudate Type: [1:Serosanguineous] [2:Serosanguineous] [N/A:N/A] Exudate Color: [1:red, brown] [2:red, brown] [N/A:N/A] Foul Odor After [1:Yes] [2:No] [N/A:N/A] Cleansing: Odor Anticipated Due to No [2:N/A] [N/A:N/A] Product Use: Wound Margin: [1:Thickened] [2:Distinct, outline attached N/A] Granulation Amount: Large (67-100%) Medium (34-66%) N/A Granulation Quality: Red Pink N/A Necrotic Amount: None Present (0%) Medium (34-66%) N/A Exposed Structures: Fascia: No Fascia: No N/A Fat: No Fat: No Tendon: No Tendon: No Muscle: No Muscle: No Joint: No Joint: No Bone: No Bone: No Limited to Skin Limited to Skin Breakdown Breakdown Epithelialization: None None N/A Periwound Skin Texture: Edema: No Edema: No N/A Excoriation: No Excoriation: No Induration: No Induration: No Callus: No Callus: No Crepitus:  No Crepitus: No Fluctuance: No Fluctuance: No Friable: No Friable: No Rash: No Rash: No Scarring: No Scarring: No Periwound Skin Moist: Yes Moist: Yes N/A Moisture: Maceration: No Maceration: No Dry/Scaly: No Dry/Scaly: No Periwound Skin Color: Atrophie Blanche: No Atrophie Blanche: No N/A Cyanosis: No Cyanosis: No Ecchymosis: No Ecchymosis: No Erythema: No Erythema: No Hemosiderin Staining: No Hemosiderin Staining: No Mottled: No Mottled: No Pallor: No Pallor: No Rubor: No Rubor: No Temperature: No Abnormality No Abnormality N/A Tenderness on Yes Yes N/A Palpation: Wound Preparation: Ulcer Cleansing: Ulcer Cleansing: N/A Rinsed/Irrigated with Rinsed/Irrigated with Saline Saline Topical Anesthetic Topical Anesthetic Applied: Other: lidocaine Applied: Other: lidocaine 4% 4% Treatment Notes Electronic Signature(s) Signed: 10/30/2015 6:07:44 PM By: Curtis Sites Entered By: Curtis Sites on 10/30/2015  16:47:02 [name] ([id number]) Flink, Rojelio [name] ([id number]) -------------------------------------------------------------------------------- Multi-Disciplinary Care Plan Details Patient Name: [name], [name] Date of Service: 10/30/2015 3:30 PM Medical Record Patient Account Number: [id number] Number: Treating RN: Curtis Sites 1936-01-06 (79 y.o. Other Clinician: Date of Birth/Sex: Female) Treating ROBSON, [name] Primary Care Physician: [name] Physician/Extender: G Referring Physician: Thedore Mins, [name] Weeks in Treatment: 2 Active Inactive Abuse / Safety / Falls / Self Care Management Nursing Diagnoses: Impaired home maintenance Impaired physical mobility Knowledge deficit related to: safety; personal, health (wound), emergency Potential for falls Self care deficit: actual or potential Goals: Patient will remain injury free Date Initiated: 10/15/2015 Goal Status: Active Patient/caregiver will verbalize understanding of skin care regimen Date Initiated: 10/15/2015 Goal Status: Active Patient/caregiver will verbalize/demonstrate measure taken to improve self care Date Initiated: 10/15/2015 Goal Status: Active Patient/caregiver will verbalize/demonstrate measures taken to improve the patient's personal safety Date Initiated: 10/15/2015 Goal Status: Active Patient/caregiver will verbalize/demonstrate measures taken to prevent injury and/or falls Date Initiated: 10/15/2015 Goal Status: Active Patient/caregiver will verbalize/demonstrate understanding of what to do in case of emergency Date Initiated: 10/15/2015 Goal Status: Active Interventions: Assess fall risk on admission and as needed Assess: immobility, friction, shearing, incontinence upon admission and as needed Assess impairment of mobility on admission and as needed per policy [name], [name] ([id number]) Assess self care needs on admission and as needed Provide education on basic  hygiene Provide education on fall prevention Provide education on personal and home safety Provide education on safe transfers Treatment Activities: Education provided on Basic Hygiene : 10/15/2015 Patient referred to home care : 10/30/2015 Notes: Nutrition Nursing Diagnoses: Imbalanced nutrition Impaired glucose control: actual or potential Potential for alteratiion in Nutrition/Potential for imbalanced nutrition Goals: Patient/caregiver agrees to and verbalizes understanding of need to obtain nutritional consultation Date Initiated: 10/15/2015 Goal Status: Active Patient/caregiver agrees to and verbalizes understanding of need to use nutritional supplements and/or vitamins as prescribed Date Initiated: 10/15/2015 Goal Status: Active Patient/caregiver verbalizes understanding of need to maintain therapeutic glucose control per primary care physician Date Initiated: 10/15/2015 Goal Status: Active Interventions: Assess patient nutrition upon admission and as needed per policy Provide education on elevated blood sugars and impact on wound healing Provide education on nutrition Treatment Activities: Education provided on Nutrition : 10/15/2015 Notes: Orientation to the Wound Care Program Nursing Diagnoses: Knowledge deficit related to the wound healing center program [name], [name] [facility] B. ([id number]) Goals: Patient/caregiver will verbalize understanding of the Wound Healing Center Program Date Initiated: 10/15/2015 Goal Status: Active Interventions: Provide education on orientation to the wound center Notes: Pressure Nursing Diagnoses: Knowledge deficit related to causes and risk factors for pressure ulcer development Knowledge deficit related to management of pressures ulcers Potential for impaired tissue integrity related to pressure,  friction, moisture, and shear Goals: Patient will remain free from development of additional pressure ulcers Date Initiated: 10/15/2015 Goal  Status: Active Patient will remain free of pressure ulcers Date Initiated: 10/15/2015 Goal Status: Active Patient/caregiver will verbalize risk factors for pressure ulcer development Date Initiated: 10/15/2015 Goal Status: Active Patient/caregiver will verbalize understanding of pressure ulcer management Date Initiated: 10/15/2015 Goal Status: Active Interventions: Assess: immobility, friction, shearing, incontinence upon admission and as needed Assess offloading mechanisms upon admission and as needed Assess potential for pressure ulcer upon admission and as needed Provide education on pressure ulcers Treatment Activities: Patient referred for home evaluation of offloading devices/mattresses : 10/30/2015 Patient referred for pressure reduction/relief devices : 10/30/2015 Patient referred for seating evaluation to ensure proper offloading : 10/30/2015 Pressure reduction/relief device ordered : 10/30/2015 Notes: [name], [name] MCDONNELL ([id number]) Wound/Skin Impairment Nursing Diagnoses: Impaired tissue integrity Knowledge deficit related to smoking impact on wound healing Knowledge deficit related to ulceration/compromised skin integrity Goals: Patient/caregiver will verbalize understanding of skin care regimen Date Initiated: 10/15/2015 Goal Status: Active Ulcer/skin breakdown will have a volume reduction of 30% by week 4 Date Initiated: 10/15/2015 Goal Status: Active Ulcer/skin breakdown will have a volume reduction of 50% by week 8 Date Initiated: 10/15/2015 Goal Status: Active Ulcer/skin breakdown will have a volume reduction of 80% by week 12 Date Initiated: 10/15/2015 Goal Status: Active Ulcer/skin breakdown will heal within 14 weeks Date Initiated: 10/15/2015 Goal Status: Active Interventions: Assess patient/caregiver ability to obtain necessary supplies Assess patient/caregiver ability to perform ulcer/skin care regimen upon admission and as needed Assess ulceration(s) every  visit Provide education on ulcer and skin care Treatment Activities: Patient referred to home care : 10/30/2015 Skin care regimen initiated : 10/30/2015 Notes: Electronic Signature(s) Signed: 10/30/2015 6:07:44 PM By: Curtis Sites Entered By: Curtis Sites on 10/30/2015 16:46:53 Tu, Rojelio [name] ([id number]) -------------------------------------------------------------------------------- Patient/Caregiver Education Details Patient Name: Pheasant, [name] Date of Service: 10/30/2015 3:30 PM Medical Record Patient Account Number: [id number] Number: Treating RN: Curtis Sites Nov 01, 1935 (79 y.o. Other Clinician: Date of Birth/Gender: Female) Treating ROBSON, [name] Primary Care Physician: [name] Physician/Extender: G Referring Physician: [name] Weeks in Treatment: 2 Education Assessment Education Provided To: Caregiver Education Topics Provided Wound/Skin Impairment: Handouts: Other: purpose of NPWT that is ordered today Methods: Explain/Verbal Responses: State content correctly Electronic Signature(s) Signed: 10/30/2015 6:07:44 PM By: Curtis Sites Entered By: Curtis Sites on 10/30/2015 16:52:37 Kalka, Rojelio [name] ([id number]) -------------------------------------------------------------------------------- Wound Assessment Details Patient Name: Strohman, [name] Date of Service: 10/30/2015 3:30 PM Medical Record Patient Account Number: [id number] Number: Treating RN: Curtis Sites March 07, 1936 (79 y.o. Other Clinician: Date of Birth/Sex: Female) Treating ROBSON, [name] Primary Care Physician: [name] Physician/Extender: G Referring Physician: Thedore Mins, [name] Weeks in Treatment: 2 Wound Status Wound Number: 1 Primary Pressure Ulcer Etiology: Wound Location: Sacrum Wound Open Wounding Event: Gradually Appeared Status: Date Acquired: 09/10/2015 Comorbid Cataracts, Anemia, Arrhythmia, Weeks Of Treatment: 2 History:  Hypertension, Myocardial Infarction, Clustered Wound: No History of pressure wounds, Osteoarthritis, Dementia Photos Photo Uploaded By: Curtis Sites on 10/30/2015 17:38:17 Wound Measurements Length: (cm) 5.1 Width: (cm) 4 Depth: (cm) 3.5 Area: (cm) 16.022 Volume: (cm) 56.077 % Reduction in Area: -6.2% % Reduction in Volume: 17.4% Epithelialization: None Tunneling: No Undermining: No Wound Description Classification: Category/Stage IV Foul Odor After Wound Margin: Thickened Due to Product Exudate Amount: Large Exudate Type: Serosanguineous Exudate Color: red, brown Cleansing: Yes Use: No Wound Bed Granulation Amount: Large (67-100%) Exposed Structure [name], [name] ([id number]) Granulation Quality: Red Fascia Exposed: No  Necrotic Amount: None Present (0%) Fat Layer Exposed: No Tendon Exposed: No Muscle Exposed: No Joint Exposed: No Bone Exposed: No Limited to Skin Breakdown Periwound Skin Texture Texture Color No Abnormalities Noted: No No Abnormalities Noted: No Callus: No Atrophie Blanche: No Crepitus: No Cyanosis: No Excoriation: No Ecchymosis: No Fluctuance: No Erythema: No Friable: No Hemosiderin Staining: No Induration: No Mottled: No Localized Edema: No Pallor: No Rash: No Rubor: No Scarring: No Temperature / Pain Moisture Temperature: No Abnormality No Abnormalities Noted: No Tenderness on Palpation: Yes Dry / Scaly: No Maceration: No Moist: Yes Wound Preparation Ulcer Cleansing: Rinsed/Irrigated with Saline Topical Anesthetic Applied: Other: lidocaine 4%, Treatment Notes Wound #1 (Sacrum) 1. Cleansed with: Clean wound with Normal Saline 2. Anesthetic Topical Lidocaine 4% cream to wound bed prior to debridement 3. Peri-wound Care: Skin Prep 4. Dressing Applied: Aquacel Ag 5. Secondary Dressing Applied Bordered Foam Dressing Electronic Signature(s) Signed: 10/30/2015 6:07:44 PM By: Curtis Sitesorthy, Joanna Entered By: Curtis Sitesorthy,  Joanna on 10/30/2015 16:46:23 [name], [name] ([id number]) [name], [name] ([id number]) -------------------------------------------------------------------------------- Wound Assessment Details Patient Name: Wisener, [name] Date of Service: 10/30/2015 3:30 PM Medical Record Patient Account Number: [id number] Number: Treating RN: Curtis SitesDorthy, Joanna 1935/12/02 (79 y.o. Other Clinician: Date of Birth/Sex: Female) Treating ROBSON, [name] Primary Care Physician: Leotis ShamesSINGH, [name] Physician/Extender: G Referring Physician: Thedore MinsSINGH, [name] Weeks in Treatment: 2 Wound Status Wound Number: 2 Primary Pressure Ulcer Etiology: Wound Location: Right Ischial Tuberosity Wound Open Wounding Event: Gradually Appeared Status: Date Acquired: 09/10/2015 Comorbid Cataracts, Anemia, Arrhythmia, Weeks Of Treatment: 2 History: Hypertension, Myocardial Infarction, Clustered Wound: No History of pressure wounds, Osteoarthritis, Dementia Photos Photo Uploaded By: Curtis Sitesorthy, Joanna on 10/30/2015 17:38:18 Wound Measurements Length: (cm) 1 Width: (cm) 1.2 Depth: (cm) 0.2 Area: (cm) 0.942 Volume: (cm) 0.188 % Reduction in Area: -20% % Reduction in Volume: -138% Epithelialization: None Tunneling: No Undermining: No Wound Description Classification: Category/Stage II Wound Margin: Distinct, outline attached Exudate Amount: Medium Exudate Type: Serosanguineous Exudate Color: red, brown Foul Odor After Cleansing: No Wound Bed Granulation Amount: Medium (34-66%) Exposed Structure Kindley, Kashayla B. ([id number]) Granulation Quality: Pink Fascia Exposed: No Necrotic Amount: Medium (34-66%) Fat Layer Exposed: No Necrotic Quality: Adherent Slough Tendon Exposed: No Muscle Exposed: No Joint Exposed: No Bone Exposed: No Limited to Skin Breakdown Periwound Skin Texture Texture Color No Abnormalities Noted: No No Abnormalities Noted: No Callus: No Atrophie Blanche: No Crepitus:  No Cyanosis: No Excoriation: No Ecchymosis: No Fluctuance: No Erythema: No Friable: No Hemosiderin Staining: No Induration: No Mottled: No Localized Edema: No Pallor: No Rash: No Rubor: No Scarring: No Temperature / Pain Moisture Temperature: No Abnormality No Abnormalities Noted: No Tenderness on Palpation: Yes Dry / Scaly: No Maceration: No Moist: Yes Wound Preparation Ulcer Cleansing: Rinsed/Irrigated with Saline Topical Anesthetic Applied: Other: lidocaine 4%, Treatment Notes Wound #2 (Right Ischial Tuberosity) 1. Cleansed with: Clean wound with Normal Saline 2. Anesthetic Topical Lidocaine 4% cream to wound bed prior to debridement 3. Peri-wound Care: Skin Prep 4. Dressing Applied: Aquacel Ag 5. Secondary Dressing Applied Bordered Foam Dressing Electronic Signature(s) Signed: 10/30/2015 6:07:44 PM By: Curtis Sitesorthy, Joanna Entered By: Curtis Sitesorthy, Joanna on 10/30/2015 16:46:43 Benincasa, [name] ([id number]) Okun, [name] ([id number]) -------------------------------------------------------------------------------- Vitals Details Patient Name: [name], [name] Date of Service: 10/30/2015 3:30 PM Medical Record Patient Account Number: [id number] Number: Treating RN: Curtis SitesDorthy, Joanna 1935/12/02 (79 y.o. Other Clinician: Date of Birth/Sex: Female) Treating ROBSON, [name] Primary Care Physician: Leotis ShamesSINGH, [name] Physician/Extender: G Referring Physician: Thedore MinsSINGH, [name] Weeks in Treatment: 2 Vital Signs  Time Taken: 16:11 Pulse (bpm): 141 Height (in): 63 Respiratory Rate (breaths/min): 16 Blood Pressure (mmHg): 107/76 Reference Range: 80 - 120 mg / dl Electronic Signature(s) Signed: 10/30/2015 6:07:44 PM By: Curtis Sites Entered By: Curtis Sites on 10/30/2015 16:11:54

## 2015-10-31 NOTE — Progress Notes (Signed)
[name], [name] ([id number]) Visit Report for 10/30/2015 Chief Complaint Document Details Patient Name: [name], [name] Date of Service: 10/30/2015 3:30 PM Medical Record Patient Account Number: [id number] Number: Treating RN: [name] Sep 24, 1935 (79 y.o. Other Clinician: Date of Birth/Sex: Female) Treating [name] Primary Care Physician/Extender: [name] Physician: Referring Physician: [name] [name] in [name]: 2 Information Obtained from: Patient Chief Complaint The patient is here for a sacral decubitus ulcer also a more superficial wound over her right hip Electronic Signature(s) Signed: 10/30/2015 5:20:54 PM By: [name] Entered By: [name] Najjar on 10/30/2015 16:59:00 [name], [name] ([id number]) -------------------------------------------------------------------------------- Debridement Details Patient Name: [name], [name] Date of Service: 10/30/2015 3:30 PM Medical Record Patient Account Number: [id number] Number: Treating RN: [name] Aug 19, 1935 (79 y.o. Other Clinician: Date of Birth/Sex: Female) Treating [name] Primary Care Physician/Extender: [name] Physician: Referring Physician: [name] [name] in [name]: 2 Debridement Performed for Wound #2 Right Ischial Tuberosity Assessment: Performed By: Physician [name] Debridement: Debridement Pre-procedure Yes Verification/Time Out Taken: Start Time: 16:45 Pain Control: Lidocaine 4% Topical Solution Level: Skin/Subcutaneous Tissue Total Area Debrided (L x 1 (cm) x 1.2 (cm) = 1.2 (cm) W): Tissue and other Viable, Non-Viable, Fibrin/Slough, Subcutaneous material debrided: Instrument: Curette Bleeding: Minimum Hemostasis Achieved: Pressure End Time: 16:48 Procedural Pain: 0 Post Procedural Pain: 0 Response to [name]: Procedure was tolerated well Post Debridement Measurements of Total  Wound Length: (cm) 1 Stage: Category/Stage II Width: (cm) 1.2 Depth: (cm) 0.2 Volume: (cm) 0.188 Post Procedure Diagnosis Same as Pre-procedure Electronic Signature(s) Signed: 10/30/2015 5:20:54 PM By: [name] Signed: 10/30/2015 6:07:44 PM By: [name] Entered By: [name] Najjar on 10/30/2015 16:57:21 [name], [name] ([id number]) [name] ([id number]) -------------------------------------------------------------------------------- HPI Details Patient Name: [name], [name] Date of Service: 10/30/2015 3:30 PM Medical Record Patient Account Number: [id number] Number: Treating RN: [name] [birth year] (79 y.o. Other Clinician: Date of Birth/Sex: Female) Treating [name] Primary Care Physician/Extender: [name] Physician: Referring Physician: [name] [name] in [name]: 2 History of Present Illness HPI Description: 10/15/15; this is a very frail lady with advanced Alzheimer's disease who is being cared for aerobically at home by her daughter and granddaughter. She is essentially nonambulatory and seems to be minimally verbal. They tell me that she started to develop a pressure area in her sacral area in January. Initially a very small open area that progressed up. There is also a more recent area on the right hip. The patient was seen in her primary office for this problem on 2/23 at that point noted to have a stage III wound with tunneling over the sacrum. There were 2 other areas noted on the upper left and right bilateral occipital. As well as a stage II on the right trochanter. She was referred to the ER because of tachycardia. She was hospitalized from 2/23 through 2/25. The admission this seems to open dominated by a distal fecal impaction. She was noted to have sinus tachycardia which improved. She did have a CT scan of the abdomen and pelvis during the hospitalization. In addition to the fecal impaction this  noted the ulcer in the left lower gluteal region adjacent to the midline. There was air in the subcutaneous wound but no evidence of an air-fluid level to suggest an abscess. There was no comment about osteomyelitis. Since her return home she has not been eating well but is drinking. The family provides total care. I note  that her albumin was 3 on admission to the hospital. Sodium got as high as 148 but was normalized by the time she left 10/24/15; this is a patient who has advanced dementia. She has a deep probing stage IV pressure ulcer over her lower sacral area. Cultures of this last week grew both Proteus and methicillin-resistant staph aureus. I and empirically put her on Cipro and doxycycline which should cover both of these. He also has a wound over her right greater trochanter. She is been followed by home health at home they're requesting lab work which I think is reasonable. I ordered a CMP, CBC with differential, sedimentation rate and a C-reactive protein 10/30/15 the patient's labs were done by home health but I think they faxed the orders to the primary physician's office. The plain x-ray I did did not show osteomyelitis however it did show fractures of the right superior and inferior pubic rami which are still not fully healed. I informed the patient's family of this. They do give a history of a fall while she was at an assisted living in [place] in November afterward she had some trouble walking this may have been the issue. Electronic Signature(s) Signed: 10/30/2015 5:20:54 PM By: [name] Entered By: [name] Najjar on 10/30/2015 17:05:28 [name], [name] ([id number]) -------------------------------------------------------------------------------- Physical Exam Details Patient Name: [name], [name] Date of Service: 10/30/2015 3:30 PM Medical Record Patient Account Number: [id number] Number: Treating RN: [name] 1935/09/02 (79 y.o. Other  Clinician: Date of Birth/Sex: Female) Treating Prestin Munch Primary Care Physician/Extender: [name] Physician: Referring Physician: Thedore Mins, [name] [name] in [name]: 2 Notes Wound exam; the area over the right greater trochanter has a surface slough and nonviable tissue that cleans up nicely after debridement. This is a stage III wound. She continues to have a stage IV wound over her lower sacral area the base of this has some exposed bone. The surrounding tissue looks remarkably well of vibrant red color. Electronic Signature(s) Signed: 10/30/2015 5:20:54 PM By: [name] Entered By: [name] Najjar on 10/30/2015 17:06:17 Bashore, [name] ([id number]) -------------------------------------------------------------------------------- Physician Orders Details Patient Name: [name], [name] Date of Service: 10/30/2015 3:30 PM Medical Record Patient Account Number: [id number] Number: Treating RN: [name] August 31, 1935 (79 y.o. Other Clinician: Date of Birth/Sex: Female) Treating Brittay Mogle Primary Care Physician/Extender: [name] Physician: Referring Physician: [name] [name] in [name]: 2 Verbal / Phone Orders: Yes Clinician: Dorthy, Joanna Read Back and Verified: Yes Diagnosis Coding Wound Cleansing Wound #1 Sacrum o Clean wound with Normal Saline. Wound #2 Right Ischial Tuberosity o Clean wound with Normal Saline. Anesthetic Wound #1 Sacrum o Topical Lidocaine 4% cream applied to wound bed prior to debridement Wound #2 Right Ischial Tuberosity o Topical Lidocaine 4% cream applied to wound bed prior to debridement Skin Barriers/Peri-Wound Care Wound #1 Sacrum o Skin Prep Wound #2 Right Ischial Tuberosity o Skin Prep Primary Wound Dressing Wound #1 Sacrum o Aquacel Ag - until NPWT is available then use collagen with silver under foam for NPWT Wound #2 Right Ischial Tuberosity o Aquacel  Ag Secondary Dressing Wound #1 Sacrum o Boardered Foam Dressing - until NPWT is available then use collagen with silver under foam for NPWT [name], [name] ([id number]) Wound #2 Right Ischial Tuberosity o Boardered Foam Dressing Dressing Change Frequency Wound #1 Sacrum o Change dressing every other day. o Change Dressing Monday, Wednesday, Friday - once NPWT is available Wound #2 Right Ischial Tuberosity o Change dressing every  other day. - may change daily as needed for excess drainage o Change Dressing Monday, Wednesday, Friday - once NPWT is available Follow-up Appointments Wound #1 Sacrum o Return Appointment in 1 week. Wound #2 Right Ischial Tuberosity o Return Appointment in 1 week. Off-Loading Wound #1 Sacrum o Roho cushion for wheelchair - To be ordered by Home health o Turn and reposition every 2 hours o Mattress [facility] bed ordered by [facility] Wound #2 Right Ischial Tuberosity o Roho cushion for wheelchair - To be ordered by Home health o Turn and reposition every 2 hours o Mattress [facility] bed ordered by [facility] Additional Orders / Instructions Wound #1 Sacrum o Increase protein intake. o Other: - Supplements: Vitamin C, MVI, Zinc Wound #2 Right Ischial Tuberosity o Increase protein intake. o Other: - Supplements: Vitamin C, MVI, Zinc Home Health Wound #1 Sacrum o Continue Home Health Visits - [facility] o Home Health Nurse may visit PRN to address patientos wound care needs. o FACE TO FACE ENCOUNTER: MEDICARE and MEDICAID PATIENTS: I certify that this patient is under my care and that I had a face-to-face encounter that meets the physician face-to-face encounter requirements with this patient on this date. The encounter with the patient was in [place], [facility] B. ([id number]) whole or in part for the following MEDICAL CONDITION: (primary reason for Home Healthcare) MEDICAL  NECESSITY: I certify, that based on my findings, NURSING services are a medically necessary home health service. HOME BOUND STATUS: I certify that my clinical findings support that this patient is homebound (i.e., Due to illness or injury, pt requires aid of supportive devices such as crutches, cane, wheelchairs, walkers, the use of special transportation or the assistance of another person to leave their place of residence. There is a normal inability to leave the home and doing so requires considerable and taxing effort. Other absences are for medical reasons / religious services and are infrequent or of short duration when for other reasons). o If current dressing causes regression in wound condition, may D/C ordered dressing product/s and apply Normal Saline Moist Dressing daily until next Wound Healing Center / Other [name] appointment. Notify Wound Healing Center of regression in wound condition at [phone]. o Please direct any NON-WOUND related issues/requests for orders to patient's Primary Care Physician Wound #2 Right Ischial Tuberosity o Continue Home Health Visits - [facility] o Home Health Nurse may visit PRN to address patientos wound care needs. o FACE TO FACE ENCOUNTER: MEDICARE and MEDICAID PATIENTS: I certify that this patient is under my care and that I had a face-to-face encounter that meets the physician face-to-face encounter requirements with this patient on this date. The encounter with the patient was in whole or in part for the following MEDICAL CONDITION: (primary reason for Home Healthcare) MEDICAL NECESSITY: I certify, that based on my findings, NURSING services are a medically necessary home health service. HOME BOUND STATUS: I certify that my clinical findings support that this patient is homebound (i.e., Due to illness or injury, pt requires aid of supportive devices such as crutches, cane, wheelchairs, walkers, the use of  special transportation or the assistance of another person to leave their place of residence. There is a normal inability to leave the home and doing so requires considerable and taxing effort. Other absences are for medical reasons / religious services and are infrequent or of short duration when for other reasons). o If current dressing causes regression in wound condition, may  D/C ordered dressing product/s and apply Normal Saline Moist Dressing daily until next Wound Healing Center / Other [name] appointment. Notify Wound Healing Center of regression in wound condition at [phone]. o Please direct any NON-WOUND related issues/requests for orders to patient's Primary Care Physician Negative Pressure Wound Therapy Wound #1 Sacrum o Wound VAC settings at 125/130 mmHg continuous pressure. Use BLACK/GREEN foam to wound cavity. Use WHITE foam to fill any tunnel/s and/or undermining. Change VAC dressing 3 X WEEK. Change canister as indicated when full. Nurse may titrate settings and frequency of dressing changes as clinically indicated. - HHRN to order NPWT for patient and initiate once it is available o Home Health Nurse may d/c VAC for s/s of increased infection, significant wound regression, or uncontrolled drainage. Notify Wound Healing Center at [phone]. o Apply contact layer over base of wound. - apply collagen with silver to wound base under foam Electronic Signature(s) Arch, ARWYN BESAW ([id number]) Signed: 10/30/2015 5:20:54 PM By: [name] Signed: 10/30/2015 6:07:44 PM By: [name] Entered By: [name] on 10/30/2015 16:56:14 [name], [name] ([id number]) -------------------------------------------------------------------------------- Problem List Details Patient Name: [name], [name] Date of Service: 10/30/2015 3:30 PM Medical Record Patient Account Number: [id number] Number: Treating RN: [name] 08/05/1935 (79 y.o. Other  Clinician: Date of Birth/Sex: Female) Treating Ananth Fiallos Primary Care Physician/Extender: [name] Physician: Referring Physician: [name] [name] in [name]: 2 Active Problems ICD-10 Encounter Code Description Active Date Diagnosis L89.154 Pressure ulcer of sacral region, stage 4 10/15/2015 Yes L03.317 Cellulitis of buttock 10/15/2015 Yes E43 Unspecified severe protein-calorie malnutrition 10/15/2015 Yes L89.213 Pressure ulcer of right hip, stage 3 10/15/2015 Yes Inactive Problems Resolved Problems Electronic Signature(s) Signed: 10/30/2015 5:20:54 PM By: [name] Entered By: [name] Najjar on 10/30/2015 16:57:09 Budai, [name] ([id number]) -------------------------------------------------------------------------------- Progress Note Details Patient Name: [name], Luzmaria B. Date of Service: 10/30/2015 3:30 PM Medical Record Patient Account Number: [id number] Number: Treating RN: [name] 01-14-1936 (79 y.o. Other Clinician: Date of Birth/Sex: Female) Treating Bernece Gall Primary Care Physician/Extender: [name] Physician: Referring Physician: [name] [name] in [name]: 2 Subjective Chief Complaint Information obtained from Patient The patient is here for a sacral decubitus ulcer also a more superficial wound over her right hip History of Present Illness (HPI) 10/15/15; this is a very frail lady with advanced Alzheimer's disease who is being cared for aerobically at home by her daughter and granddaughter. She is essentially nonambulatory and seems to be minimally verbal. They tell me that she started to develop a pressure area in her sacral area in January. Initially a very small open area that progressed up. There is also a more recent area on the right hip. The patient was seen in her primary office for this problem on 2/23 at that point noted to have a stage III wound with tunneling over the sacrum.  There were 2 other areas noted on the upper left and right bilateral occipital. As well as a stage II on the right trochanter. She was referred to the ER because of tachycardia. She was hospitalized from 2/23 through 2/25. The admission this seems to open dominated by a distal fecal impaction. She was noted to have sinus tachycardia which improved. She did have a CT scan of the abdomen and pelvis during the hospitalization. In addition to the fecal impaction this noted the ulcer in the left lower gluteal region adjacent to the midline. There was air in the subcutaneous wound but no evidence of an  air-fluid level to suggest an abscess. There was no comment about osteomyelitis. Since her return home she has not been eating well but is drinking. The family provides total care. I note that her albumin was 3 on admission to the hospital. Sodium got as high as 148 but was normalized by the time she left 10/24/15; this is a patient who has advanced dementia. She has a deep probing stage IV pressure ulcer over her lower sacral area. Cultures of this last week grew both Proteus and methicillin-resistant staph aureus. I and empirically put her on Cipro and doxycycline which should cover both of these. He also has a wound over her right greater trochanter. She is been followed by home health at home they're requesting lab work which I think is reasonable. I ordered a CMP, CBC with differential, sedimentation rate and a C-reactive protein 10/30/15 the patient's labs were done by home health but I think they faxed the orders to the primary physician's office. The plain x-ray I did did not show osteomyelitis however it did show fractures of the right superior and inferior pubic rami which are still not fully healed. I informed the patient's family of this. They do give a history of a fall while she was at an assisted living in [place] in November afterward she had some trouble walking this may have been the  issue. Maiers, [name] ([id number]) Objective Constitutional Vitals Time Taken: 4:11 PM, Height: 63 in, Pulse: 141 bpm, Respiratory Rate: 16 breaths/min, Blood Pressure: 107/76 mmHg. Integumentary (Hair, Skin) Wound #1 status is Open. Original cause of wound was Gradually Appeared. The wound is located on the Sacrum. The wound measures 5.1cm length x 4cm width x 3.5cm depth; 16.022cm^2 area and 56.077cm^3 volume. The wound is limited to skin breakdown. There is no tunneling or undermining noted. There is a large amount of serosanguineous drainage noted. The wound margin is thickened. There is large (67- 100%) red granulation within the wound bed. There is no necrotic tissue within the wound bed. The periwound skin appearance exhibited: Moist. The periwound skin appearance did not exhibit: Callus, Crepitus, Excoriation, Fluctuance, Friable, Induration, Localized Edema, Rash, Scarring, Dry/Scaly, Maceration, Atrophie Blanche, Cyanosis, Ecchymosis, Hemosiderin Staining, Mottled, Pallor, Rubor, Erythema. Periwound temperature was noted as No Abnormality. The periwound has tenderness on palpation. Wound #2 status is Open. Original cause of wound was Gradually Appeared. The wound is located on the Right Ischial Tuberosity. The wound measures 1cm length x 1.2cm width x 0.2cm depth; 0.942cm^2 area and 0.188cm^3 volume. The wound is limited to skin breakdown. There is no tunneling or undermining noted. There is a medium amount of serosanguineous drainage noted. The wound margin is distinct with the outline attached to the wound base. There is medium (34-66%) pink granulation within the wound bed. There is a medium (34-66%) amount of necrotic tissue within the wound bed including Adherent Slough. The periwound skin appearance exhibited: Moist. The periwound skin appearance did not exhibit: Callus, Crepitus, Excoriation, Fluctuance, Friable, Induration, Localized Edema, Rash, Scarring,  Dry/Scaly, Maceration, Atrophie Blanche, Cyanosis, Ecchymosis, Hemosiderin Staining, Mottled, Pallor, Rubor, Erythema. Periwound temperature was noted as No Abnormality. The periwound has tenderness on palpation. Assessment Active Problems ICD-10 L89.154 - Pressure ulcer of sacral region, stage 4 L03.317 - Cellulitis of buttock E43 - Unspecified severe protein-calorie malnutrition L89.213 - Pressure ulcer of right hip, stage 3 Teasdale, Kynslie B. ([id number]) Procedures Wound #2 Wound #2 is a Pressure Ulcer located on the Right Ischial Tuberosity . There was a Skin/Subcutaneous Tissue  Debridement ([id number]) debridement with total area of 1.2 sq cm performed by [name]. with the following instrument(s): Curette to remove Viable and Non-Viable tissue/material including Fibrin/Slough and Subcutaneous after achieving pain control using Lidocaine 4% Topical Solution. A time out was conducted prior to the start of the procedure. A Minimum amount of bleeding was controlled with Pressure. The procedure was tolerated well with a pain level of 0 throughout and a pain level of 0 following the procedure. Post Debridement Measurements: 1cm length x 1.2cm width x 0.2cm depth; 0.188cm^3 volume. Post debridement Stage noted as Category/Stage II. Post procedure Diagnosis Wound #2: Same as Pre-Procedure Plan Wound Cleansing: Wound #1 Sacrum: Clean wound with Normal Saline. Wound #2 Right Ischial Tuberosity: Clean wound with Normal Saline. Anesthetic: Wound #1 Sacrum: Topical Lidocaine 4% cream applied to wound bed prior to debridement Wound #2 Right Ischial Tuberosity: Topical Lidocaine 4% cream applied to wound bed prior to debridement Skin Barriers/Peri-Wound Care: Wound #1 Sacrum: Skin Prep Wound #2 Right Ischial Tuberosity: Skin Prep Primary Wound Dressing: Wound #1 Sacrum: Aquacel Ag - until NPWT is available then use collagen with silver under foam for NPWT Wound #2 Right  Ischial Tuberosity: Aquacel Ag Secondary Dressing: Wound #1 Sacrum: Boardered Foam Dressing - until NPWT is available then use collagen with silver under foam for NPWT Wound #2 Right Ischial Tuberosity: Boardered Foam Dressing Dressing Change Frequency: Wound #1 Sacrum: Avena, Sallyanne B. ([id number]) Change dressing every other day. Change Dressing Monday, Wednesday, Friday - once NPWT is available Wound #2 Right Ischial Tuberosity: Change dressing every other day. - may change daily as needed for excess drainage Change Dressing Monday, Wednesday, Friday - once NPWT is available Follow-up Appointments: Wound #1 Sacrum: Return Appointment in 1 week. Wound #2 Right Ischial Tuberosity: Return Appointment in 1 week. Off-Loading: Wound #1 Sacrum: Roho cushion for wheelchair - To be ordered by Home health Turn and reposition every 2 hours [facility] bed ordered by [facility] Wound #2 Right Ischial Tuberosity: Roho cushion for wheelchair - To be ordered by Home health Turn and reposition every 2 hours Mattress [facility] bed ordered by [facility] Additional Orders / Instructions: Wound #1 Sacrum: Increase protein intake. Other: - Supplements: Vitamin C, MVI, Zinc Wound #2 Right Ischial Tuberosity: Increase protein intake. Other: - Supplements: Vitamin C, MVI, Zinc Home Health: Wound #1 Sacrum: Continue Home Health Visits - [facility] Home Health Nurse may visit PRN to address patient s wound care needs. FACE TO FACE ENCOUNTER: MEDICARE and MEDICAID PATIENTS: I certify that this patient is under my care and that I had a face-to-face encounter that meets the physician face-to-face encounter requirements with this patient on this date. The encounter with the patient was in whole or in part for the following MEDICAL CONDITION: (primary reason for Home Healthcare) MEDICAL NECESSITY: I certify, that based on my findings, NURSING services are a  medically necessary home health service. HOME BOUND STATUS: I certify that my clinical findings support that this patient is homebound (i.e., Due to illness or injury, pt requires aid of supportive devices such as crutches, cane, wheelchairs, walkers, the use of special transportation or the assistance of another person to leave their place of residence. There is a normal inability to leave the home and doing so requires considerable and taxing effort. Other absences are for medical reasons / religious services and are infrequent or of short duration when for other reasons). If current dressing causes regression in  wound condition, may D/C ordered dressing product/s and apply Normal Saline Moist Dressing daily until next Wound Healing Center / Other [name] appointment. Notify Wound Healing Center of regression in wound condition at [phone]. Please direct any NON-WOUND related issues/requests for orders to patient's Primary Care Physician Wound #2 Right Ischial Tuberosity: Continue Home Health Visits - [facility] Health Nurse may visit PRN to address patient s wound care needs. FACE TO FACE ENCOUNTER: MEDICARE and MEDICAID PATIENTS: I certify that this patient is under my care and that I had a face-to-face encounter that meets the physician face-to-face encounter requirements with this patient on this date. The encounter with the patient was in whole or in part for the following MEDICAL CONDITION: (primary reason for Home Healthcare) MEDICAL NECESSITY: I certify, Onley, Shakiera B. ([id number]) that based on my findings, NURSING services are a medically necessary home health service. HOME BOUND STATUS: I certify that my clinical findings support that this patient is homebound (i.e., Due to illness or injury, pt requires aid of supportive devices such as crutches, cane, wheelchairs, walkers, the use of special transportation or the assistance of another person to leave their place  of residence. There is a normal inability to leave the home and doing so requires considerable and taxing effort. Other absences are for medical reasons / religious services and are infrequent or of short duration when for other reasons). If current dressing causes regression in wound condition, may D/C ordered dressing product/s and apply Normal Saline Moist Dressing daily until next Wound Healing Center / Other [name] appointment. Notify Wound Healing Center of regression in wound condition at [phone]. Please direct any NON-WOUND related issues/requests for orders to patient's Primary Care Physician Negative Pressure Wound Therapy: Wound #1 Sacrum: Wound VAC settings at 125/130 mmHg continuous pressure. Use BLACK/GREEN foam to wound cavity. Use WHITE foam to fill any tunnel/s and/or undermining. Change VAC dressing 3 X WEEK. Change canister as indicated when full. Nurse may titrate settings and frequency of dressing changes as clinically indicated. - HHRN to order NPWT for patient and initiate once it is available Home Health Nurse may d/c VAC for s/s of increased infection, significant wound regression, or uncontrolled drainage. Notify Wound Healing Center at [phone]. Apply contact layer over base of wound. - apply collagen with silver to wound base under foam 1 we are going to continue the Aquacel Ag to the wound over the right greater trochanter with a foam cover. 2 I am not going to be able to do an MRI in this patient because of the severity of her dementia. She has completed 2 [name] of antibiotics. The tissue appears to be a lot better. Therefore I decided to break with protocol a bit and go ahead and order a wound VAC with collagen under the foam. We'll see her in follow- up next week. [facility] and we are going to go ahead and have them apply this. Electronic Signature(s) Signed: 10/30/2015 5:20:54 PM By: [name] Entered By: [name] Najjar on  10/30/2015 17:07:46 Hink, [name] ([id number]) -------------------------------------------------------------------------------- SuperBill Details Patient Name: Colton, [name] Date of Service: 10/30/2015 Medical Record Patient Account Number: [id number] Number: Treating RN: [name] [birth year] (79 y.o. Other Clinician: Date of Birth/Sex: Female) Treating Seaton Hofmann Primary Care Physician/Extender: [name] Physician: [name] in [name]: 2 Referring Physician: [facility], [name] Diagnosis Coding ICD-10 Codes Code Description L89.154 Pressure ulcer of sacral region, stage 4 L03.317 Cellulitis of buttock E43 Unspecified severe  protein-calorie malnutrition L89.213 Pressure ulcer of right hip, stage 3 Facility Procedures CPT4 Code: [id number] Description: 11042 - DEB SUBQ TISSUE 20 SQ CM/< ICD-10 Description Diagnosis L89.213 Pressure ulcer of right hip, stage 3 Modifier: Quantity: 1 Physician Procedures CPT4 Code: [id number] Description: 11042 - WC PHYS SUBQ TISS 20 SQ CM ICD-10 Description Diagnosis L89.213 Pressure ulcer of right hip, stage 3 Modifier: Quantity: 1 Electronic Signature(s) Signed: 10/30/2015 5:20:54 PM By: [name] Najjarobson, Greyson Peavy [name] Entered By: [name] Najjarobson, [place] on 10/30/2015 17:09:15

## 2015-11-01 DIAGNOSIS — I10 Essential (primary) hypertension: Secondary | ICD-10-CM | POA: Diagnosis not present

## 2015-11-01 DIAGNOSIS — L89210 Pressure ulcer of right hip, unstageable: Secondary | ICD-10-CM | POA: Diagnosis not present

## 2015-11-01 DIAGNOSIS — R000 Tachycardia, unspecified: Secondary | ICD-10-CM | POA: Diagnosis not present

## 2015-11-01 DIAGNOSIS — E785 Hyperlipidemia, unspecified: Secondary | ICD-10-CM | POA: Diagnosis not present

## 2015-11-01 DIAGNOSIS — G309 Alzheimer's disease, unspecified: Secondary | ICD-10-CM | POA: Diagnosis not present

## 2015-11-01 DIAGNOSIS — L89150 Pressure ulcer of sacral region, unstageable: Secondary | ICD-10-CM | POA: Diagnosis not present

## 2015-11-01 DIAGNOSIS — L89620 Pressure ulcer of left heel, unstageable: Secondary | ICD-10-CM | POA: Diagnosis not present

## 2015-11-06 ENCOUNTER — Ambulatory Visit: Payer: Medicare Other | Admitting: Internal Medicine

## 2015-11-06 DIAGNOSIS — T8189XA Other complications of procedures, not elsewhere classified, initial encounter: Secondary | ICD-10-CM | POA: Diagnosis not present

## 2015-11-06 DIAGNOSIS — L89154 Pressure ulcer of sacral region, stage 4: Secondary | ICD-10-CM | POA: Diagnosis not present

## 2015-11-06 DIAGNOSIS — G300 Alzheimer's disease with early onset: Secondary | ICD-10-CM | POA: Diagnosis not present

## 2015-11-06 DIAGNOSIS — L89159 Pressure ulcer of sacral region, unspecified stage: Secondary | ICD-10-CM | POA: Diagnosis not present

## 2015-11-07 DIAGNOSIS — L89620 Pressure ulcer of left heel, unstageable: Secondary | ICD-10-CM | POA: Diagnosis not present

## 2015-11-07 DIAGNOSIS — L89150 Pressure ulcer of sacral region, unstageable: Secondary | ICD-10-CM | POA: Diagnosis not present

## 2015-11-07 DIAGNOSIS — G309 Alzheimer's disease, unspecified: Secondary | ICD-10-CM | POA: Diagnosis not present

## 2015-11-07 DIAGNOSIS — I10 Essential (primary) hypertension: Secondary | ICD-10-CM | POA: Diagnosis not present

## 2015-11-07 DIAGNOSIS — R000 Tachycardia, unspecified: Secondary | ICD-10-CM | POA: Diagnosis not present

## 2015-11-07 DIAGNOSIS — L89210 Pressure ulcer of right hip, unstageable: Secondary | ICD-10-CM | POA: Diagnosis not present

## 2015-11-07 DIAGNOSIS — E785 Hyperlipidemia, unspecified: Secondary | ICD-10-CM | POA: Diagnosis not present

## 2015-11-11 DIAGNOSIS — L89620 Pressure ulcer of left heel, unstageable: Secondary | ICD-10-CM | POA: Diagnosis not present

## 2015-11-11 DIAGNOSIS — R000 Tachycardia, unspecified: Secondary | ICD-10-CM | POA: Diagnosis not present

## 2015-11-11 DIAGNOSIS — G309 Alzheimer's disease, unspecified: Secondary | ICD-10-CM | POA: Diagnosis not present

## 2015-11-11 DIAGNOSIS — I10 Essential (primary) hypertension: Secondary | ICD-10-CM | POA: Diagnosis not present

## 2015-11-11 DIAGNOSIS — E785 Hyperlipidemia, unspecified: Secondary | ICD-10-CM | POA: Diagnosis not present

## 2015-11-11 DIAGNOSIS — L89150 Pressure ulcer of sacral region, unstageable: Secondary | ICD-10-CM | POA: Diagnosis not present

## 2015-11-11 DIAGNOSIS — L89210 Pressure ulcer of right hip, unstageable: Secondary | ICD-10-CM | POA: Diagnosis not present

## 2015-11-12 DIAGNOSIS — L89154 Pressure ulcer of sacral region, stage 4: Secondary | ICD-10-CM | POA: Diagnosis not present

## 2015-11-12 DIAGNOSIS — G300 Alzheimer's disease with early onset: Secondary | ICD-10-CM | POA: Diagnosis not present

## 2015-11-12 DIAGNOSIS — L89159 Pressure ulcer of sacral region, unspecified stage: Secondary | ICD-10-CM | POA: Diagnosis not present

## 2015-11-12 DIAGNOSIS — T8189XA Other complications of procedures, not elsewhere classified, initial encounter: Secondary | ICD-10-CM | POA: Diagnosis not present

## 2015-11-13 ENCOUNTER — Encounter: Payer: Medicare Other | Attending: Internal Medicine | Admitting: Internal Medicine

## 2015-11-13 DIAGNOSIS — L89154 Pressure ulcer of sacral region, stage 4: Secondary | ICD-10-CM | POA: Insufficient documentation

## 2015-11-13 DIAGNOSIS — I252 Old myocardial infarction: Secondary | ICD-10-CM | POA: Diagnosis not present

## 2015-11-13 DIAGNOSIS — Z880 Allergy status to penicillin: Secondary | ICD-10-CM | POA: Diagnosis not present

## 2015-11-13 DIAGNOSIS — F0280 Dementia in other diseases classified elsewhere without behavioral disturbance: Secondary | ICD-10-CM | POA: Diagnosis not present

## 2015-11-13 DIAGNOSIS — Z87891 Personal history of nicotine dependence: Secondary | ICD-10-CM | POA: Insufficient documentation

## 2015-11-13 DIAGNOSIS — G309 Alzheimer's disease, unspecified: Secondary | ICD-10-CM | POA: Insufficient documentation

## 2015-11-13 DIAGNOSIS — L89212 Pressure ulcer of right hip, stage 2: Secondary | ICD-10-CM | POA: Diagnosis not present

## 2015-11-13 DIAGNOSIS — L89213 Pressure ulcer of right hip, stage 3: Secondary | ICD-10-CM | POA: Insufficient documentation

## 2015-11-13 DIAGNOSIS — D649 Anemia, unspecified: Secondary | ICD-10-CM | POA: Diagnosis not present

## 2015-11-13 DIAGNOSIS — L03317 Cellulitis of buttock: Secondary | ICD-10-CM | POA: Diagnosis not present

## 2015-11-13 DIAGNOSIS — I10 Essential (primary) hypertension: Secondary | ICD-10-CM | POA: Insufficient documentation

## 2015-11-13 DIAGNOSIS — E43 Unspecified severe protein-calorie malnutrition: Secondary | ICD-10-CM | POA: Insufficient documentation

## 2015-11-13 DIAGNOSIS — M1990 Unspecified osteoarthritis, unspecified site: Secondary | ICD-10-CM | POA: Diagnosis not present

## 2015-11-13 NOTE — Progress Notes (Addendum)
Yamada, BERENICE OEHLERT ([id number]) Visit Report for 11/13/2015 Arrival Information Details Patient Name: Busche, CHERESE B. Date of Service: 11/13/2015 10:45 AM Medical Record Patient Account Number: [id number] Number: Treating RN: [name], RN, [name], [name] [birth year] [phone] y.o. Other Clinician: Date of Birth/Sex: Female) Treating ROBSON, [name] Primary Care Physician: [name] Physician/Extender: G Referring Physician: [name] Weeks in Treatment: 4 Visit Information History Since Last Visit Added or deleted any medications: No Patient Arrived: Wheel Chair Any new allergies or adverse reactions: No Arrival Time: 11:11 Had a fall or experienced change in No Accompanied By: dtr activities of daily living that may affect Transfer Assistance: EasyPivot risk of falls: Patient Lift Has Dressing in Place as Prescribed: Yes Patient Identification Verified: Yes Pain Present Now: No Secondary Verification Process Yes Completed: Patient Requires Transmission- No Based Precautions: Patient Has Alerts: No Electronic Signature(s) Signed: 11/13/2015 11:11:57 AM By: Elpidio Eric [name], RN Entered By: Elpidio Eric on 11/13/2015 11:11:57 Cadenhead, [name] ([id number]) -------------------------------------------------------------------------------- Clinic Level of Care Assessment Details Patient Name: Cleek, [name] Date of Service: 11/13/2015 10:45 AM Medical Record Patient Account Number: [id number] Number: Treating RN: [name], RN, [name], [name] May 19, 1936 [phone] y.o. Other Clinician: Date of Birth/Sex: Female) Treating ROBSON, [name] Primary Care Physician: [name] Physician/Extender: G Referring Physician: Thedore Mins, [name] Weeks in Treatment: 4 Clinic Level of Care Assessment Items TOOL 4 Quantity Score []  - Use when only an EandM is performed on FOLLOW-UP visit 0 ASSESSMENTS - Nursing Assessment / Reassessment X - Reassessment of Co-morbidities (includes  updates in patient status) 1 10 X - Reassessment of Adherence to Treatment Plan 1 5 ASSESSMENTS - Wound and Skin Assessment / Reassessment X - Simple Wound Assessment / Reassessment - one wound 1 5 []  - Complex Wound Assessment / Reassessment - multiple wounds 0 []  - Dermatologic / Skin Assessment (not related to wound area) 0 ASSESSMENTS - Focused Assessment []  - Circumferential Edema Measurements - multi extremities 0 []  - Nutritional Assessment / Counseling / Intervention 0 []  - Lower Extremity Assessment (monofilament, tuning fork, pulses) 0 []  - Peripheral Arterial Disease Assessment (using hand held doppler) 0 ASSESSMENTS - Ostomy and/or Continence Assessment and Care []  - Incontinence Assessment and Management 0 []  - Ostomy Care Assessment and Management (repouching, etc.) 0 PROCESS - Coordination of Care X - Simple Patient / Family Education for ongoing care 1 15 []  - Complex (extensive) Patient / Family Education for ongoing care 0 []  - Staff obtains Chiropractor, Records, Test Results / Process Orders 0 []  - Staff telephones HHA, Nursing Homes / Clarify orders / etc 0 Spoelstra, Beau B. ([id number]) []  - Routine Transfer to another Facility (non-emergent condition) 0 []  - Routine Hospital Admission (non-emergent condition) 0 []  - New Admissions / Manufacturing engineer / Ordering NPWT, Apligraf, etc. 0 []  - Emergency Hospital Admission (emergent condition) 0 []  - Simple Discharge Coordination 0 []  - Complex (extensive) Discharge Coordination 0 PROCESS - Special Needs []  - Pediatric / Minor Patient Management 0 []  - Isolation Patient Management 0 []  - Hearing / Language / Visual special needs 0 []  - Assessment of Community assistance (transportation, D/C planning, etc.) 0 []  - Additional assistance / Altered mentation 0 []  - Support Surface(s) Assessment (bed, cushion, seat, etc.) 0 INTERVENTIONS - Wound Cleansing / Measurement X - Simple Wound Cleansing - one wound 1 5 []  -  Complex Wound Cleansing - multiple wounds 0 X - Wound Imaging (photographs - any number of wounds) 1 5 []  - Wound Tracing (instead of  photographs) 0 X - Simple Wound Measurement - one wound 1 5 []  - Complex Wound Measurement - multiple wounds 0 INTERVENTIONS - Wound Dressings []  - Small Wound Dressing one or multiple wounds 0 X - Medium Wound Dressing one or multiple wounds 2 15 []  - Large Wound Dressing one or multiple wounds 0 []  - Application of Medications - topical 0 []  - Application of Medications - injection 0 Burak, Lu B. ([id number]) INTERVENTIONS - Miscellaneous []  - External ear exam 0 []  - Specimen Collection (cultures, biopsies, blood, body fluids, etc.) 0 []  - Specimen(s) / Culture(s) sent or taken to Lab for analysis 0 X - Patient Transfer (multiple staff / Michiel Sites Lift / Similar devices) 1 10 []  - Simple Staple / Suture removal (25 or less) 0 []  - Complex Staple / Suture removal (26 or more) 0 []  - Hypo / Hyperglycemic Management (close monitor of Blood Glucose) 0 []  - Ankle / Brachial Index (ABI) - do not check if billed separately 0 X - Vital Signs 1 5 Has the patient been seen at the hospital within the last three years: Yes Total Score: 95 Level Of Care: New/Established - Level 3 Electronic Signature(s) Signed: 11/13/2015 12:12:20 PM By: Elpidio Eric [name], RN Entered By: Elpidio Eric on 11/13/2015 12:12:20 Slates, [name] ([id number]) -------------------------------------------------------------------------------- Encounter Discharge Information Details Patient Name: Covin, [name] Date of Service: 11/13/2015 10:45 AM Medical Record Patient Account Number: [id number] Number: Treating RN: [name], RN, [name], [name] 1935/09/15 [phone] y.o. Other Clinician: Date of Birth/Sex: Female) Treating ROBSON, [name] Primary Care Physician: [name] Physician/Extender: G Referring Physician: [name] Weeks in Treatment: 4 Encounter Discharge  Information Items Discharge Pain Level: 0 Discharge Condition: Stable Ambulatory Status: Wheelchair Discharge Destination: Home Transportation: Private Auto Accompanied By: dtr Schedule Follow-up Appointment: No Medication Reconciliation completed and provided to Patient/Care No Sherlon Nied: Provided on Clinical Summary of Care: 11/13/2015 Form Type Recipient Paper Patient Ann Maki Electronic Signature(s) Signed: 11/13/2015 5:26:15 PM By: Elpidio Eric [name], RN Previous Signature: 11/13/2015 12:06:58 PM Version By: Gwenlyn Perking Entered By: Elpidio Eric on 11/13/2015 17:26:15 Bruinsma, [name] ([id number]) -------------------------------------------------------------------------------- Multi Wound Chart Details Patient Name: Shore, [name] Date of Service: 11/13/2015 10:45 AM Medical Record Patient Account Number: [id number] Number: Treating RN: [name], RN, [name], [name] 1935/10/18 [phone] y.o. Other Clinician: Date of Birth/Sex: Female) Treating ROBSON, [name] Primary Care Physician: [name] Physician/Extender: G Referring Physician: Thedore Mins, [name] Weeks in Treatment: 4 Vital Signs Height(in): 63 Pulse(bpm): 87 Weight(lbs): Blood Pressure 95/56 (mmHg): Body Mass Index(BMI): Temperature(F): 98.3 Respiratory Rate 17 (breaths/min): Photos: [1:No Photos] [2:No Photos] [N/A:N/A] Wound Location: [1:Sacrum] [2:Right Ischial Tuberosity N/A] Wounding Event: [1:Gradually Appeared] [2:Gradually Appeared] [N/A:N/A] Primary Etiology: [1:Pressure Ulcer] [2:Pressure Ulcer] [N/A:N/A] Comorbid History: [1:Cataracts, Anemia, Arrhythmia, Hypertension, Arrhythmia, Hypertension, Myocardial Infarction, History of pressure wounds, Osteoarthritis, Dementia] [2:Cataracts, Anemia, Myocardial Infarction, History of pressure wounds,  Osteoarthritis, Dementia] [N/A:N/A] Date Acquired: [1:09/10/2015] [2:09/10/2015] [N/A:N/A] Weeks of Treatment: [1:4] [2:4] [N/A:N/A] Wound Status: [1:Open] [2:Open]  [N/A:N/A] Measurements L x W x D 4x2.5x2.5 [2:1x1x0.1] [N/A:N/A] (cm) Area (cm) : [1:7.854] [2:0.785] [N/A:N/A] Volume (cm) : [1:19.635] [2:0.079] [N/A:N/A] % Reduction in Area: [1:47.90%] [2:0.00%] [N/A:N/A] % Reduction in Volume: 71.10% [2:0.00%] [N/A:N/A] Classification: [1:Category/Stage IV] [2:Category/Stage II] [N/A:N/A] Exudate Amount: [1:Large] [2:Medium] [N/A:N/A] Exudate Type: [1:Serosanguineous] [2:Serosanguineous] [N/A:N/A] Exudate Color: [1:red, brown] [2:red, brown] [N/A:N/A] Foul Odor After [1:Yes] [2:No] [N/A:N/A] Cleansing: Odor Anticipated Due to No [2:N/A] [N/A:N/A] Product Use: Wound Margin: [1:Thickened] [2:Distinct, outline attached N/A] Granulation Amount: Large (67-100%) Large (67-100%) N/A  Granulation Quality: Red Pink N/A Necrotic Amount: None Present (0%) Small (1-33%) N/A Exposed Structures: Fascia: No Fascia: No N/A Fat: No Fat: No Tendon: No Tendon: No Muscle: No Muscle: No Joint: No Joint: No Bone: No Bone: No Limited to Skin Limited to Skin Breakdown Breakdown Epithelialization: None None N/A Periwound Skin Texture: Edema: No Edema: No N/A Excoriation: No Excoriation: No Induration: No Induration: No Callus: No Callus: No Crepitus: No Crepitus: No Fluctuance: No Fluctuance: No Friable: No Friable: No Rash: No Rash: No Scarring: No Scarring: No Periwound Skin Moist: Yes Moist: Yes N/A Moisture: Maceration: No Maceration: No Dry/Scaly: No Dry/Scaly: No Periwound Skin Color: Atrophie Blanche: No Atrophie Blanche: No N/A Cyanosis: No Cyanosis: No Ecchymosis: No Ecchymosis: No Erythema: No Erythema: No Hemosiderin Staining: No Hemosiderin Staining: No Mottled: No Mottled: No Pallor: No Pallor: No Rubor: No Rubor: No Temperature: No Abnormality No Abnormality N/A Tenderness on Yes Yes N/A Palpation: Wound Preparation: Ulcer Cleansing: Ulcer Cleansing: N/A Rinsed/Irrigated with Rinsed/Irrigated with Saline  Saline Topical Anesthetic Topical Anesthetic Applied: Other: lidocaine Applied: Other: lidocaine 4% 4% Treatment Notes Electronic Signature(s) Signed: 11/13/2015 4:32:43 PM By: Elpidio EricAfful, [name], RN Entered By: Elpidio EricAfful, [name] on 11/13/2015 11:43:22 Oriley, [name] ([id number]) Littlejohn, [name] ([id number]) -------------------------------------------------------------------------------- Multi-Disciplinary Care Plan Details Patient Name: Castellanos, Marylouise StacksWILMA B. Date of Service: 11/13/2015 10:45 AM Medical Record Patient Account Number: [id number] Number: Treating RN: Clover MealyAfful, RN, [name], [name] 21-Dec-1935 [phone] y.o. Other Clinician: Date of Birth/Sex: Female) Treating ROBSON, [name] Primary Care Physician: Leotis ShamesSINGH, [name] Physician/Extender: G Referring Physician: Thedore MinsSINGH, [name] Weeks in Treatment: 4 Active Inactive Abuse / Safety / Falls / Self Care Management Nursing Diagnoses: Impaired home maintenance Impaired physical mobility Knowledge deficit related to: safety; personal, health (wound), emergency Potential for falls Self care deficit: actual or potential Goals: Patient will remain injury free Date Initiated: 10/15/2015 Goal Status: Active Patient/caregiver will verbalize understanding of skin care regimen Date Initiated: 10/15/2015 Goal Status: Active Patient/caregiver will verbalize/demonstrate measure taken to improve self care Date Initiated: 10/15/2015 Goal Status: Active Patient/caregiver will verbalize/demonstrate measures taken to improve the patient's personal safety Date Initiated: 10/15/2015 Goal Status: Active Patient/caregiver will verbalize/demonstrate measures taken to prevent injury and/or falls Date Initiated: 10/15/2015 Goal Status: Active Patient/caregiver will verbalize/demonstrate understanding of what to do in case of emergency Date Initiated: 10/15/2015 Goal Status: Active Interventions: Assess fall risk on admission and as needed Assess: immobility,  friction, shearing, incontinence upon admission and as needed Assess impairment of mobility on admission and as needed per policy Manship, Favor B. ([id number]) Assess self care needs on admission and as needed Provide education on basic hygiene Provide education on fall prevention Provide education on personal and home safety Provide education on safe transfers Treatment Activities: Education provided on Basic Hygiene : 10/15/2015 Patient referred to home care : 11/13/2015 Notes: Nutrition Nursing Diagnoses: Imbalanced nutrition Impaired glucose control: actual or potential Potential for alteratiion in Nutrition/Potential for imbalanced nutrition Goals: Patient/caregiver agrees to and verbalizes understanding of need to obtain nutritional consultation Date Initiated: 10/15/2015 Goal Status: Active Patient/caregiver agrees to and verbalizes understanding of need to use nutritional supplements and/or vitamins as prescribed Date Initiated: 10/15/2015 Goal Status: Active Patient/caregiver verbalizes understanding of need to maintain therapeutic glucose control per primary care physician Date Initiated: 10/15/2015 Goal Status: Active Interventions: Assess patient nutrition upon admission and as needed per policy Provide education on elevated blood sugars and impact on wound healing Provide education on nutrition Treatment Activities: Education provided on Nutrition : 10/15/2015 Notes: Orientation to  the Wound Care Program Nursing Diagnoses: Knowledge deficit related to the wound healing center program Reaver, [facility] B. ([id number]) Goals: Patient/caregiver will verbalize understanding of the Wound Healing Center Program Date Initiated: 10/15/2015 Goal Status: Active Interventions: Provide education on orientation to the wound center Notes: Pressure Nursing Diagnoses: Knowledge deficit related to causes and risk factors for pressure ulcer development Knowledge deficit related to  management of pressures ulcers Potential for impaired tissue integrity related to pressure, friction, moisture, and shear Goals: Patient will remain free from development of additional pressure ulcers Date Initiated: 10/15/2015 Goal Status: Active Patient will remain free of pressure ulcers Date Initiated: 10/15/2015 Goal Status: Active Patient/caregiver will verbalize risk factors for pressure ulcer development Date Initiated: 10/15/2015 Goal Status: Active Patient/caregiver will verbalize understanding of pressure ulcer management Date Initiated: 10/15/2015 Goal Status: Active Interventions: Assess: immobility, friction, shearing, incontinence upon admission and as needed Assess offloading mechanisms upon admission and as needed Assess potential for pressure ulcer upon admission and as needed Provide education on pressure ulcers Treatment Activities: Patient referred for home evaluation of offloading devices/mattresses : 11/13/2015 Patient referred for pressure reduction/relief devices : 11/13/2015 Patient referred for seating evaluation to ensure proper offloading : 11/13/2015 Pressure reduction/relief device ordered : 11/13/2015 Notes: Grave, [name] ([id number]) Wound/Skin Impairment Nursing Diagnoses: Impaired tissue integrity Knowledge deficit related to smoking impact on wound healing Knowledge deficit related to ulceration/compromised skin integrity Goals: Patient/caregiver will verbalize understanding of skin care regimen Date Initiated: 10/15/2015 Goal Status: Active Ulcer/skin breakdown will have a volume reduction of 30% by week 4 Date Initiated: 10/15/2015 Goal Status: Active Ulcer/skin breakdown will have a volume reduction of 50% by week 8 Date Initiated: 10/15/2015 Goal Status: Active Ulcer/skin breakdown will have a volume reduction of 80% by week 12 Date Initiated: 10/15/2015 Goal Status: Active Ulcer/skin breakdown will heal within 14 weeks Date Initiated:  10/15/2015 Goal Status: Active Interventions: Assess patient/caregiver ability to obtain necessary supplies Assess patient/caregiver ability to perform ulcer/skin care regimen upon admission and as needed Assess ulceration(s) every visit Provide education on ulcer and skin care Treatment Activities: Patient referred to home care : 11/13/2015 Skin care regimen initiated : 11/13/2015 Notes: Electronic Signature(s) Signed: 11/13/2015 4:32:43 PM By: Elpidio Eric [name], RN Entered By: Elpidio Eric on 11/13/2015 11:43:08 Stalvey, [name] ([id number]) -------------------------------------------------------------------------------- Pain Assessment Details Patient Name: Murthy, [name] Date of Service: 11/13/2015 10:45 AM Medical Record Patient Account Number: [id number] Number: Treating RN: [name], RN, [name], [name] 18-Oct-1935 [phone] y.o. Other Clinician: Date of Birth/Sex: Female) Treating ROBSON, [name] Primary Care Physician: [name] Physician/Extender: G Referring Physician: Thedore Mins, [name] Weeks in Treatment: 4 Active Problems Location of Pain Severity and Description of Pain Patient Has Paino No Site Locations Pain Management and Medication Current Pain Management: Electronic Signature(s) Signed: 11/13/2015 11:12:12 AM By: Elpidio Eric [name], RN Entered By: Elpidio Eric on 11/13/2015 11:12:12 Nagorski, [name] ([id number]) -------------------------------------------------------------------------------- Patient/Caregiver Education Details Patient Name: Kari, [name] Date of Service: 11/13/2015 10:45 AM Medical Record Patient Account Number: [id number] Number: Treating RN: [name], RN, [name], [name] 05/22/1936 [phone] y.o. Other Clinician: Date of Birth/Gender: Female) Treating ROBSON, [name] Primary Care Physician: [name] Physician/Extender: G Referring Physician: [name] Weeks in Treatment: 4 Education Assessment Education Provided To: Caregiver  daughter and grand dtr Education Topics Provided Basic Hygiene: Methods: Explain/Verbal Responses: State content correctly Elevated Blood Sugar/ Impact on Healing: Methods: Explain/Verbal Responses: State content correctly Nutrition: Methods: Explain/Verbal Responses: State content correctly Pressure: Methods: Explain/Verbal Responses: State content correctly Safety: Methods:  Explain/Verbal Responses: State content correctly Welcome To The Wound Care Center: Methods: Explain/Verbal Responses: State content correctly Wound/Skin Impairment: Methods: Explain/Verbal Responses: State content correctly Electronic Signature(s) Signed: 11/13/2015 5:27:11 PM By: Elpidio Eric [name], RN Messmer, [name] ([id number]) Entered By: Elpidio Eric on 11/13/2015 17:27:11 Ryant, [name] ([id number]) -------------------------------------------------------------------------------- Wound Assessment Details Patient Name: Blas, [name] Date of Service: 11/13/2015 10:45 AM Medical Record Patient Account Number: [id number] Number: Treating RN: [name], RN, [name], [name] October 08, 1935 [phone] y.o. Other Clinician: Date of Birth/Sex: Female) Treating ROBSON, [name] Primary Care Physician: [name] Physician/Extender: G Referring Physician: Thedore Mins, [name] Weeks in Treatment: 4 Wound Status Wound Number: 1 Primary Pressure Ulcer Etiology: Wound Location: Sacrum Wound Open Wounding Event: Gradually Appeared Status: Date Acquired: 09/10/2015 Comorbid Cataracts, Anemia, Arrhythmia, Weeks Of Treatment: 4 History: Hypertension, Myocardial Infarction, Clustered Wound: No History of pressure wounds, Osteoarthritis, Dementia Photos Photo Uploaded By: Elpidio Eric on 11/13/2015 17:18:12 Wound Measurements Length: (cm) 4 Width: (cm) 2.5 Depth: (cm) 2.5 Area: (cm) 7.854 Volume: (cm) 19.635 % Reduction in Area: 47.9% % Reduction in Volume: 71.1% Epithelialization: None Tunneling:  No Undermining: No Wound Description Classification: Category/Stage IV Wound Margin: Thickened Exudate Amount: Large Exudate Type: Serosanguineous Exudate Color: red, brown Foul Odor After Cleansing: Yes Due to Product Use: No Wound Bed Granulation Amount: Large (67-100%) Exposed Structure Poppen, Zoriyah B. ([id number]) Granulation Quality: Red Fascia Exposed: No Necrotic Amount: None Present (0%) Fat Layer Exposed: No Tendon Exposed: No Muscle Exposed: No Joint Exposed: No Bone Exposed: No Limited to Skin Breakdown Periwound Skin Texture Texture Color No Abnormalities Noted: No No Abnormalities Noted: No Callus: No Atrophie Blanche: No Crepitus: No Cyanosis: No Excoriation: No Ecchymosis: No Fluctuance: No Erythema: No Friable: No Hemosiderin Staining: No Induration: No Mottled: No Localized Edema: No Pallor: No Rash: No Rubor: No Scarring: No Temperature / Pain Moisture Temperature: No Abnormality No Abnormalities Noted: No Tenderness on Palpation: Yes Dry / Scaly: No Maceration: No Moist: Yes Wound Preparation Ulcer Cleansing: Rinsed/Irrigated with Saline Topical Anesthetic Applied: Other: lidocaine 4%, Treatment Notes Wound #1 (Sacrum) 1. Cleansed with: Clean wound with Normal Saline 4. Dressing Applied: Prisma Ag 8. Negative Pressure Wound Therapy Wound Vac to wound continuously at 191mm/hg pressure Black Foam Number of foam/gauze pieces used in the dressing = Notes 2 black foam applied Electronic Signature(s) Signed: 11/13/2015 11:29:36 AM By: Elpidio Eric [name], RN Entered By: Elpidio Eric on 11/13/2015 11:29:36 Fussell, [name] ([id number]) [name], [name] ([id number]) -------------------------------------------------------------------------------- Wound Assessment Details Patient Name: Gallien, [name] Date of Service: 11/13/2015 10:45 AM Medical Record Patient Account Number: [id number] Number: Treating RN: [name], RN, [name],  [name] [birth year] [phone] y.o. Other Clinician: Date of Birth/Sex: Female) Treating ROBSON, [name] Primary Care Physician: [name] Physician/Extender: G Referring Physician: Thedore Mins, [name] Weeks in Treatment: 4 Wound Status Wound Number: 2 Primary Pressure Ulcer Etiology: Wound Location: Right Ischial Tuberosity Wound Open Wounding Event: Gradually Appeared Status: Date Acquired: 09/10/2015 Comorbid Cataracts, Anemia, Arrhythmia, Weeks Of Treatment: 4 History: Hypertension, Myocardial Infarction, Clustered Wound: No History of pressure wounds, Osteoarthritis, Dementia Photos Photo Uploaded By: Elpidio Eric on 11/13/2015 17:18:12 Wound Measurements Length: (cm) 1 Width: (cm) 1 Depth: (cm) 0.1 Area: (cm) 0.785 Volume: (cm) 0.079 % Reduction in Area: 0% % Reduction in Volume: 0% Epithelialization: None Tunneling: No Undermining: No Wound Description Classification: Category/Stage II Wound Margin: Distinct, outline attached Exudate Amount: Medium Exudate Type: Serosanguineous Exudate Color: red, brown Foul Odor After Cleansing: No Wound Bed Granulation Amount: Large (67-100%) Exposed Structure  Walsworth, [name] ([id number]) Granulation Quality: Pink Fascia Exposed: No Necrotic Amount: Small (1-33%) Fat Layer Exposed: No Necrotic Quality: Adherent Slough Tendon Exposed: No Muscle Exposed: No Joint Exposed: No Bone Exposed: No Limited to Skin Breakdown Periwound Skin Texture Texture Color No Abnormalities Noted: No No Abnormalities Noted: No Callus: No Atrophie Blanche: No Crepitus: No Cyanosis: No Excoriation: No Ecchymosis: No Fluctuance: No Erythema: No Friable: No Hemosiderin Staining: No Induration: No Mottled: No Localized Edema: No Pallor: No Rash: No Rubor: No Scarring: No Temperature / Pain Moisture Temperature: No Abnormality No Abnormalities Noted: No Tenderness on Palpation: Yes Dry / Scaly: No Maceration: No Moist: Yes Wound  Preparation Ulcer Cleansing: Rinsed/Irrigated with Saline Topical Anesthetic Applied: Other: lidocaine 4%, Treatment Notes Wound #2 (Right Ischial Tuberosity) 1. Cleansed with: Clean wound with Normal Saline 4. Dressing Applied: Prisma Ag 8. Negative Pressure Wound Therapy Wound Vac to wound continuously at 123mm/hg pressure Black Foam Number of foam/gauze pieces used in the dressing = Notes 2 black foam applied Electronic Signature(s) Signed: 11/13/2015 11:30:05 AM By: Elpidio Eric [name], RN Entered By: Elpidio Eric on 11/13/2015 11:30:05 Flannagan, [name] ([id number]) Koestner, [name] ([id number]) -------------------------------------------------------------------------------- Vitals Details Patient Name: Arciga, [name] Date of Service: 11/13/2015 10:45 AM Medical Record Patient Account Number: [id number] Number: Treating RN: [name], RN, [name], [name] 1936/02/16 [phone] y.o. Other Clinician: Date of Birth/Sex: Female) Treating ROBSON, [name] Primary Care Physician: [name] Physician/Extender: G Referring Physician: Thedore Mins, [name] Weeks in Treatment: 4 Vital Signs Time Taken: 11:20 Temperature (F): 98.3 Height (in): 63 Pulse (bpm): 87 Respiratory Rate (breaths/min): 17 Blood Pressure (mmHg): 95/56 Reference Range: 80 - 120 mg / dl Electronic Signature(s) Signed: 11/13/2015 4:32:43 PM By: Elpidio Eric [name], RN Entered By: Elpidio Eric on 11/13/2015 11:21:38

## 2015-11-14 NOTE — Progress Notes (Signed)
[name] ([id number]) Visit Report for 11/13/2015 Chief Complaint Document Details Patient Name: [name], [name] Date of Service: 11/13/2015 10:45 AM Medical Record Patient Account Number: [id number] Number: Treating RN: [name], RN, [name], [name] 1935-09-03 [phone] y.o. Other Clinician: Date of Birth/Sex: Female) Treating ROBSON, [name] Primary Care Physician/Extender: [name], [name] Physician: Referring Physician: [name] Weeks in Treatment: 4 Information Obtained from: Patient Chief Complaint The patient is here for a sacral decubitus ulcer also a more superficial wound over her right hip Electronic Signature(s) Signed: 11/13/2015 4:27:19 PM By: Baltazar Najjar MD Entered By: Baltazar Najjar on 11/13/2015 11:56:16 Shibley, [name] ([id number]) -------------------------------------------------------------------------------- Debridement Details Patient Name: [name], [name] Date of Service: 11/13/2015 10:45 AM Medical Record Patient Account Number: [id number] Number: Treating RN: [name], RN, [name], [name] 1936/02/06 [phone] y.o. Other Clinician: Date of Birth/Sex: Female) Treating ROBSON, [name] Primary Care Physician/Extender: [name], [name] Physician: Referring Physician: [name] Weeks in Treatment: 4 Debridement Performed for Wound #2 Right Ischial Tuberosity Assessment: Performed By: Physician Maxwell Caul, MD Debridement: Debridement Pre-procedure Yes Verification/Time Out Taken: Start Time: 11:45 Pain Control: Lidocaine 2% Topical Gel Level: Skin/Subcutaneous Tissue Total Area Debrided (L x 1 (cm) x 1 (cm) = 1 (cm) W): Tissue and other Non-Viable, Subcutaneous material debrided: Instrument: Curette Bleeding: None End Time: 11:47 Procedural Pain: 3 Post Procedural Pain: 1 Response to Treatment: Procedure was tolerated well Post Debridement Measurements of Total Wound Length: (cm) 1 Stage: Category/Stage II Width:  (cm) 1 Depth: (cm) 0.1 Volume: (cm) 0.079 Post Procedure Diagnosis Same as Pre-procedure Electronic Signature(s) Signed: 11/13/2015 12:13:16 PM By: Elpidio Eric [name], RN Signed: 11/13/2015 4:27:19 PM By: Baltazar Najjar MD Entered By: Elpidio Eric on 11/13/2015 12:13:16 [name], [name] ([id number]) -------------------------------------------------------------------------------- HPI Details Patient Name: [name], [name] Date of Service: 11/13/2015 10:45 AM Medical Record Patient Account Number: [id number] Number: Treating RN: [name], RN, [name], [name] 07-22-1936 [phone] y.o. Other Clinician: Date of Birth/Sex: Female) Treating ROBSON, [name] Primary Care Physician/Extender: [name], [name] Physician: Referring Physician: [name] Weeks in Treatment: 4 History of Present Illness HPI Description: 10/15/15; this is a very frail lady with advanced Alzheimer's disease who is being cared for aerobically at home by her daughter and granddaughter. She is essentially nonambulatory and seems to be minimally verbal. They tell me that she started to develop a pressure area in her sacral area in January. Initially a very small open area that progressed up. There is also a more recent area on the right hip. The patient was seen in her primary office for this problem on 2/23 at that point noted to have a stage III wound with tunneling over the sacrum. There were 2 other areas noted on the upper left and right bilateral occipital. As well as a stage II on the right trochanter. She was referred to the ER because of tachycardia. She was hospitalized from 2/23 through 2/25. The admission this seems to open dominated by a distal fecal impaction. She was noted to have sinus tachycardia which improved. She did have a CT scan of the abdomen and pelvis during the hospitalization. In addition to the fecal impaction this noted the ulcer in the left lower gluteal region adjacent to the midline. There  was air in the subcutaneous wound but no evidence of an air-fluid level to suggest an abscess. There was no comment about osteomyelitis. Since her return home she has not been eating well but is drinking. The family provides total care. I note that  her albumin was 3 on admission to the hospital. Sodium got as high as 148 but was normalized by the time she left 10/24/15; this is a patient who has advanced dementia. She has a deep probing stage IV pressure ulcer over her lower sacral area. Cultures of this last week grew both Proteus and methicillin-resistant staph aureus. I and empirically put her on Cipro and doxycycline which should cover both of these. He also has a wound over her right greater trochanter. She is been followed by home health at home they're requesting lab work which I think is reasonable. I ordered a CMP, CBC with differential, sedimentation rate and a C-reactive protein 10/30/15 the patient's labs were done by home health but I think they faxed the orders to the primary physician's office. The plain x-ray I did did not show osteomyelitis however it did show fractures of the right superior and inferior pubic rami which are still not fully healed. I informed the patient's family of this. They do give a history of a fall while she was at an assisted living in [place] in November afterward she had some trouble walking this may have been the issue. 11/13/15 the wound VAC only started 6 days ago. She is apparently eating well Electronic Signature(s) Signed: 11/13/2015 4:27:19 PM By: Baltazar Najjarobson, [name] MD Entered By: Baltazar Najjarobson, [name] on 11/13/2015 11:59:18 [name], [name] ([id number]) -------------------------------------------------------------------------------- Physical Exam Details Patient Name: [name], [name] Date of Service: 11/13/2015 10:45 AM Medical Record Patient Account Number: [id number] Number: Treating RN: Clover MealyAfful, RN, [name], [name] May 20, 1936 [phone](79 y.o. Other  Clinician: Date of Birth/Sex: Female) Treating ROBSON, [name] Primary Care Physician/Extender: Marlana LatusG SINGH, [name] Physician: Referring Physician: Leotis ShamesSINGH, [name] Weeks in Treatment: 4 Constitutional Patient is hypotensive.. Pulse regular and within target range for patient.Marland Kitchen. Respirations regular, non-labored and within target range.. Temperature is normal and within the target range for the patient.. Patient does not appear to be any distress. Eyes Conjunctivae clear. No discharge.. Notes Wound exam; the area over the right greater trochanter is debrided from hyper granulation tissue. This appears to be progressing towards closure. The large stage IV wound over the coccyx and lower sacrum appears to have healthy granulation for the most part although there is some adherent slough superiorly Electronic Signature(s) Signed: 11/13/2015 4:27:19 PM By: Baltazar Najjarobson, [name] MD Entered By: Baltazar Najjarobson, [name] on 11/13/2015 12:02:50 Gao, [name] ([id number]) -------------------------------------------------------------------------------- Physician Orders Details Patient Name: Kossman, [name] Date of Service: 11/13/2015 10:45 AM Medical Record Patient Account Number: [id number] Number: Treating RN: Clover MealyAfful, RN, [name], [name] May 20, 1936 [phone](79 y.o. Other Clinician: Date of Birth/Sex: Female) Treating ROBSON, [name] Primary Care Physician/Extender: Marlana LatusG SINGH, [name] Physician: Referring Physician: Leotis ShamesSINGH, [name] Weeks in Treatment: 4 Verbal / Phone Orders: Yes Clinician: [name], RN, [name], [name] Read Back and Verified: Yes Diagnosis Coding Wound Cleansing Wound #1 Sacrum o Clean wound with Normal Saline. Wound #2 Right Ischial Tuberosity o Clean wound with Normal Saline. Anesthetic Wound #1 Sacrum o Topical Lidocaine 4% cream applied to wound bed prior to debridement Wound #2 Right Ischial Tuberosity o Topical Lidocaine 4% cream applied to wound bed prior to debridement Skin  Barriers/Peri-Wound Care Wound #1 Sacrum o Skin Prep Wound #2 Right Ischial Tuberosity o Skin Prep Primary Wound Dressing Wound #1 Sacrum o Aquacel Ag - until NPWT is available then use collagen with silver under foam for NPWT Wound #2 Right Ischial Tuberosity o Aquacel Ag Secondary Dressing Wound #1 Sacrum o Boardered Foam Dressing - until NPWT is available then use  collagen with silver under foam for NPWT Vandeven, Summers B. ([id number]) Wound #2 Right Ischial Tuberosity o Boardered Foam Dressing Dressing Change Frequency Wound #1 Sacrum o Change dressing every other day. o Change Dressing Monday, Wednesday, Friday Wound #2 Right Ischial Tuberosity o Change dressing every other day. - may change daily as needed for excess drainage o Change Dressing Monday, Wednesday, Friday Follow-up Appointments Wound #1 Sacrum o Return Appointment in 1 week. Wound #2 Right Ischial Tuberosity o Return Appointment in 1 week. Off-Loading Wound #1 Sacrum o Roho cushion for wheelchair - To be ordered by Home health o Turn and reposition every 2 hours o Mattress [facility] bed ordered by [facility] Wound #2 Right Ischial Tuberosity o Roho cushion for wheelchair - To be ordered by Home health o Turn and reposition every 2 hours o Mattress [facility] bed ordered by [facility] Additional Orders / Instructions Wound #1 Sacrum o Increase protein intake. o Other: - Supplements: Vitamin C, MVI, Zinc Wound #2 Right Ischial Tuberosity o Increase protein intake. o Other: - Supplements: Vitamin C, MVI, Zinc Home Health Wound #1 Sacrum o Continue Home Health Visits - [facility] o Home Health Nurse may visit PRN to address patientos wound care needs. o FACE TO FACE ENCOUNTER: MEDICARE and MEDICAID PATIENTS: I certify that this patient is under my care and that I had a face-to-face encounter that meets the physician  face-to-face encounter requirements with this patient on this date. The encounter with the patient was in [place], [facility] B. ([id number]) whole or in part for the following MEDICAL CONDITION: (primary reason for Home Healthcare) MEDICAL NECESSITY: I certify, that based on my findings, NURSING services are a medically necessary home health service. HOME BOUND STATUS: I certify that my clinical findings support that this patient is homebound (i.e., Due to illness or injury, pt requires aid of supportive devices such as crutches, cane, wheelchairs, walkers, the use of special transportation or the assistance of another person to leave their place of residence. There is a normal inability to leave the home and doing so requires considerable and taxing effort. Other absences are for medical reasons / religious services and are infrequent or of short duration when for other reasons). o If current dressing causes regression in wound condition, may D/C ordered dressing product/s and apply Normal Saline Moist Dressing daily until next Wound Healing Center / Other MD appointment. Notify Wound Healing Center of regression in wound condition at [phone]. o Please direct any NON-WOUND related issues/requests for orders to patient's Primary Care Physician Wound #2 Right Ischial Tuberosity o Continue Home Health Visits - [facility] o Home Health Nurse may visit PRN to address patientos wound care needs. o FACE TO FACE ENCOUNTER: MEDICARE and MEDICAID PATIENTS: I certify that this patient is under my care and that I had a face-to-face encounter that meets the physician face-to-face encounter requirements with this patient on this date. The encounter with the patient was in whole or in part for the following MEDICAL CONDITION: (primary reason for Home Healthcare) MEDICAL NECESSITY: I certify, that based on my findings, NURSING services are a medically necessary home health service.  HOME BOUND STATUS: I certify that my clinical findings support that this patient is homebound (i.e., Due to illness or injury, pt requires aid of supportive devices such as crutches, cane, wheelchairs, walkers, the use of special transportation or the assistance of another person to leave their place of residence. There is a normal inability  to leave the home and doing so requires considerable and taxing effort. Other absences are for medical reasons / religious services and are infrequent or of short duration when for other reasons). o If current dressing causes regression in wound condition, may D/C ordered dressing product/s and apply Normal Saline Moist Dressing daily until next Wound Healing Center / Other MD appointment. Notify Wound Healing Center of regression in wound condition at [phone]. o Please direct any NON-WOUND related issues/requests for orders to patient's Primary Care Physician Negative Pressure Wound Therapy Wound #1 Sacrum o Wound VAC settings at 125/130 mmHg continuous pressure. Use BLACK/GREEN foam to wound cavity. Use WHITE foam to fill any tunnel/s and/or undermining. Change VAC dressing 3 X WEEK. Change canister as indicated when full. Nurse may titrate settings and frequency of dressing changes as clinically indicated. - HHRN to order NPWT for patient and initiate once it is available o Home Health Nurse may d/c VAC for s/s of increased infection, significant wound regression, or uncontrolled drainage. Notify Wound Healing Center at [phone]. o Apply contact layer over base of wound. - apply collagen with silver to wound base under foam Electronic Signature(s) Woodford, [name] ([id number]) Signed: 11/13/2015 4:27:19 PM By: Baltazar Najjar MD Signed: 11/13/2015 4:32:43 PM By: Elpidio Eric [name], RN Entered By: Elpidio Eric on 11/13/2015 11:44:35 Giller, [name]  ([id number]) -------------------------------------------------------------------------------- Problem List Details Patient Name: Vinzant, [name] Date of Service: 11/13/2015 10:45 AM Medical Record Patient Account Number: [id number] Number: Treating RN: [name], RN, [name], [name] 1936/07/19 [phone] y.o. Other Clinician: Date of Birth/Sex: Female) Treating ROBSON, [name] Primary Care Physician/Extender: [name], [name] Physician: Referring Physician: [name] Weeks in Treatment: 4 Active Problems ICD-10 Encounter Code Description Active Date Diagnosis L89.154 Pressure ulcer of sacral region, stage 4 10/15/2015 Yes L03.317 Cellulitis of buttock 10/15/2015 Yes E43 Unspecified severe protein-calorie malnutrition 10/15/2015 Yes L89.213 Pressure ulcer of right hip, stage 3 10/15/2015 Yes Inactive Problems Resolved Problems Electronic Signature(s) Signed: 11/13/2015 4:27:19 PM By: Baltazar Najjar MD Entered By: Baltazar Najjar on 11/13/2015 11:54:24 [place], [name] ([id number]) -------------------------------------------------------------------------------- Progress Note Details Patient Name: Monceaux, Khelani B. Date of Service: 11/13/2015 10:45 AM Medical Record Patient Account Number: [id number] Number: Treating RN: [name], RN, [name], [name] 1936-01-01 [phone] y.o. Other Clinician: Date of Birth/Sex: Female) Treating ROBSON, [name] Primary Care Physician/Extender: [name], [name] Physician: Referring Physician: [name] Weeks in Treatment: 4 Subjective Chief Complaint Information obtained from Patient The patient is here for a sacral decubitus ulcer also a more superficial wound over her right hip History of Present Illness (HPI) 10/15/15; this is a very frail lady with advanced Alzheimer's disease who is being cared for aerobically at home by her daughter and granddaughter. She is essentially nonambulatory and seems to be minimally verbal. They tell me that  she started to develop a pressure area in her sacral area in January. Initially a very small open area that progressed up. There is also a more recent area on the right hip. The patient was seen in her primary office for this problem on 2/23 at that point noted to have a stage III wound with tunneling over the sacrum. There were 2 other areas noted on the upper left and right bilateral occipital. As well as a stage II on the right trochanter. She was referred to the ER because of tachycardia. She was hospitalized from 2/23 through 2/25. The admission this seems to open dominated by a distal fecal impaction. She was noted to have sinus tachycardia  which improved. She did have a CT scan of the abdomen and pelvis during the hospitalization. In addition to the fecal impaction this noted the ulcer in the left lower gluteal region adjacent to the midline. There was air in the subcutaneous wound but no evidence of an air-fluid level to suggest an abscess. There was no comment about osteomyelitis. Since her return home she has not been eating well but is drinking. The family provides total care. I note that her albumin was 3 on admission to the hospital. Sodium got as high as 148 but was normalized by the time she left 10/24/15; this is a patient who has advanced dementia. She has a deep probing stage IV pressure ulcer over her lower sacral area. Cultures of this last week grew both Proteus and methicillin-resistant staph aureus. I and empirically put her on Cipro and doxycycline which should cover both of these. He also has a wound over her right greater trochanter. She is been followed by home health at home they're requesting lab work which I think is reasonable. I ordered a CMP, CBC with differential, sedimentation rate and a C-reactive protein 10/30/15 the patient's labs were done by home health but I think they faxed the orders to the primary physician's office. The plain x-ray I did did not show  osteomyelitis however it did show fractures of the right superior and inferior pubic rami which are still not fully healed. I informed the patient's family of this. They do give a history of a fall while she was at an assisted living in [place] in November afterward she had some trouble walking this may have been the issue. 11/13/15 the wound VAC only started 6 days ago. She is apparently eating well Krol, [name] ([id number]) Objective Constitutional Patient is hypotensive.. Pulse regular and within target range for patient.Marland Kitchen Respirations regular, non-labored and within target range.. Temperature is normal and within the target range for the patient.. Patient does not appear to be any distress. Vitals Time Taken: 11:20 AM, Height: 63 in, Temperature: 98.3 F, Pulse: 87 bpm, Respiratory Rate: 17 breaths/min, Blood Pressure: 95/56 mmHg. Eyes Conjunctivae clear. No discharge.. General Notes: Wound exam; the area over the right greater trochanter is debrided from hyper granulation tissue. This appears to be progressing towards closure. The large stage IV wound over the coccyx and lower sacrum appears to have healthy granulation for the most part although there is some adherent slough superiorly Integumentary (Hair, Skin) Wound #1 status is Open. Original cause of wound was Gradually Appeared. The wound is located on the Sacrum. The wound measures 4cm length x 2.5cm width x 2.5cm depth; 7.854cm^2 area and 19.635cm^3 volume. The wound is limited to skin breakdown. There is no tunneling or undermining noted. There is a large amount of serosanguineous drainage noted. The wound margin is thickened. There is large (67- 100%) red granulation within the wound bed. There is no necrotic tissue within the wound bed. The periwound skin appearance exhibited: Moist. The periwound skin appearance did not exhibit: Callus, Crepitus, Excoriation, Fluctuance, Friable, Induration, Localized Edema, Rash,  Scarring, Dry/Scaly, Maceration, Atrophie Blanche, Cyanosis, Ecchymosis, Hemosiderin Staining, Mottled, Pallor, Rubor, Erythema. Periwound temperature was noted as No Abnormality. The periwound has tenderness on palpation. Wound #2 status is Open. Original cause of wound was Gradually Appeared. The wound is located on the Right Ischial Tuberosity. The wound measures 1cm length x 1cm width x 0.1cm depth; 0.785cm^2 area and 0.079cm^3 volume. The wound is limited to skin breakdown. There is no  tunneling or undermining noted. There is a medium amount of serosanguineous drainage noted. The wound margin is distinct with the outline attached to the wound base. There is large (67-100%) pink granulation within the wound bed. There is a small (1-33%) amount of necrotic tissue within the wound bed including Adherent Slough. The periwound skin appearance exhibited: Moist. The periwound skin appearance did not exhibit: Callus, Crepitus, Excoriation, Fluctuance, Friable, Induration, Localized Edema, Rash, Scarring, Dry/Scaly, Maceration, Atrophie Blanche, Cyanosis, Ecchymosis, Hemosiderin Staining, Mottled, Pallor, Rubor, Erythema. Periwound temperature was noted as No Abnormality. The periwound has tenderness on palpation. Moody, [name] ([id number]) Assessment Active Problems ICD-10 L89.154 - Pressure ulcer of sacral region, stage 4 L03.317 - Cellulitis of buttock E43 - Unspecified severe protein-calorie malnutrition L89.213 - Pressure ulcer of right hip, stage 3 Plan Wound Cleansing: Wound #1 Sacrum: Clean wound with Normal Saline. Wound #2 Right Ischial Tuberosity: Clean wound with Normal Saline. Anesthetic: Wound #1 Sacrum: Topical Lidocaine 4% cream applied to wound bed prior to debridement Wound #2 Right Ischial Tuberosity: Topical Lidocaine 4% cream applied to wound bed prior to debridement Skin Barriers/Peri-Wound Care: Wound #1 Sacrum: Skin Prep Wound #2 Right Ischial  Tuberosity: Skin Prep Primary Wound Dressing: Wound #1 Sacrum: Aquacel Ag - until NPWT is available then use collagen with silver under foam for NPWT Wound #2 Right Ischial Tuberosity: Aquacel Ag Secondary Dressing: Wound #1 Sacrum: Boardered Foam Dressing - until NPWT is available then use collagen with silver under foam for NPWT Wound #2 Right Ischial Tuberosity: Boardered Foam Dressing Dressing Change Frequency: Wound #1 Sacrum: Change dressing every other day. Change Dressing Monday, Wednesday, Friday Wound #2 Right Ischial Tuberosity: Change dressing every other day. - may change daily as needed for excess drainage Change Dressing Monday, Wednesday, Friday Follow-up Appointments: Fenech, [name] ([id number]) Wound #1 Sacrum: Return Appointment in 1 week. Wound #2 Right Ischial Tuberosity: Return Appointment in 1 week. Off-Loading: Wound #1 Sacrum: Roho cushion for wheelchair - To be ordered by Home health Turn and reposition every 2 hours Mattress [facility] bed ordered by [facility] Wound #2 Right Ischial Tuberosity: Roho cushion for wheelchair - To be ordered by Home health Turn and reposition every 2 hours Mattress - Hospital bed ordered by [facility] Additional Orders / Instructions: Wound #1 Sacrum: Increase protein intake. Other: - Supplements: Vitamin C, MVI, Zinc Wound #2 Right Ischial Tuberosity: Increase protein intake. Other: - Supplements: Vitamin C, MVI, Zinc Home Health: Wound #1 Sacrum: Continue Home Health Visits - [facility] Home Health Nurse may visit PRN to address patient s wound care needs. FACE TO FACE ENCOUNTER: MEDICARE and MEDICAID PATIENTS: I certify that this patient is under my care and that I had a face-to-face encounter that meets the physician face-to-face encounter requirements with this patient on this date. The encounter with the patient was in whole or in part for the following MEDICAL CONDITION:  (primary reason for Home Healthcare) MEDICAL NECESSITY: I certify, that based on my findings, NURSING services are a medically necessary home health service. HOME BOUND STATUS: I certify that my clinical findings support that this patient is homebound (i.e., Due to illness or injury, pt requires aid of supportive devices such as crutches, cane, wheelchairs, walkers, the use of special transportation or the assistance of another person to leave their place of residence. There is a normal inability to leave the home and doing so requires considerable and taxing effort. Other absences are for medical reasons / religious services  and are infrequent or of short duration when for other reasons). If current dressing causes regression in wound condition, may D/C ordered dressing product/s and apply Normal Saline Moist Dressing daily until next Wound Healing Center / Other MD appointment. Notify Wound Healing Center of regression in wound condition at [phone]. Please direct any NON-WOUND related issues/requests for orders to patient's Primary Care Physician Wound #2 Right Ischial Tuberosity: Continue Home Health Visits - [facility] Home Health Nurse may visit PRN to address patient s wound care needs. FACE TO FACE ENCOUNTER: MEDICARE and MEDICAID PATIENTS: I certify that this patient is under my care and that I had a face-to-face encounter that meets the physician face-to-face encounter requirements with this patient on this date. The encounter with the patient was in whole or in part for the following MEDICAL CONDITION: (primary reason for Home Healthcare) MEDICAL NECESSITY: I certify, that based on my findings, NURSING services are a medically necessary home health service. HOME BOUND STATUS: I certify that my clinical findings support that this patient is homebound (i.e., Due to illness or injury, pt requires aid of supportive devices such as crutches, cane, wheelchairs, walkers, the  use of special transportation or the assistance of another person to leave their place of residence. There is a normal inability to leave the home and doing so requires considerable and taxing effort. Other absences are for medical reasons / religious services and are infrequent or of short duration when for other reasons). Zender, [name] ([id number]) If current dressing causes regression in wound condition, may D/C ordered dressing product/s and apply Normal Saline Moist Dressing daily until next Wound Healing Center / Other MD appointment. Notify Wound Healing Center of regression in wound condition at [phone]. Please direct any NON-WOUND related issues/requests for orders to patient's Primary Care Physician Negative Pressure Wound Therapy: Wound #1 Sacrum: Wound VAC settings at 125/130 mmHg continuous pressure. Use BLACK/GREEN foam to wound cavity. Use WHITE foam to fill any tunnel/s and/or undermining. Change VAC dressing 3 X WEEK. Change canister as indicated when full. Nurse may titrate settings and frequency of dressing changes as clinically indicated. - HHRN to order NPWT for patient and initiate once it is available Home Health Nurse may d/c VAC for s/s of increased infection, significant wound regression, or uncontrolled drainage. Notify Wound Healing Center at [phone]. Apply contact layer over base of wound. - apply collagen with silver to wound base under foam #1 collagen with silver under the foam for wound VAC. 2 foam to the greater trochanter Electronic Signature(s) Signed: 11/13/2015 4:27:19 PM By: Baltazar Najjar MD Entered By: Baltazar Najjar on 11/13/2015 12:05:09 Doubrava, [name] ([id number]) -------------------------------------------------------------------------------- SuperBill Details Patient Name: Edgley, [name] Date of Service: 11/13/2015 Medical Record Patient Account Number: [id number] Number: Treating RN: [name], RN, [name],  [name] [birth year] [phone] y.o. 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

## 2015-11-15 DIAGNOSIS — R000 Tachycardia, unspecified: Secondary | ICD-10-CM | POA: Diagnosis not present

## 2015-11-15 DIAGNOSIS — L89210 Pressure ulcer of right hip, unstageable: Secondary | ICD-10-CM | POA: Diagnosis not present

## 2015-11-15 DIAGNOSIS — L89620 Pressure ulcer of left heel, unstageable: Secondary | ICD-10-CM | POA: Diagnosis not present

## 2015-11-15 DIAGNOSIS — L89150 Pressure ulcer of sacral region, unstageable: Secondary | ICD-10-CM | POA: Diagnosis not present

## 2015-11-15 DIAGNOSIS — I10 Essential (primary) hypertension: Secondary | ICD-10-CM | POA: Diagnosis not present

## 2015-11-15 DIAGNOSIS — E785 Hyperlipidemia, unspecified: Secondary | ICD-10-CM | POA: Diagnosis not present

## 2015-11-15 DIAGNOSIS — G309 Alzheimer's disease, unspecified: Secondary | ICD-10-CM | POA: Diagnosis not present

## 2015-11-17 DIAGNOSIS — L89159 Pressure ulcer of sacral region, unspecified stage: Secondary | ICD-10-CM | POA: Diagnosis not present

## 2015-11-18 DIAGNOSIS — E785 Hyperlipidemia, unspecified: Secondary | ICD-10-CM | POA: Diagnosis not present

## 2015-11-18 DIAGNOSIS — L89210 Pressure ulcer of right hip, unstageable: Secondary | ICD-10-CM | POA: Diagnosis not present

## 2015-11-18 DIAGNOSIS — I10 Essential (primary) hypertension: Secondary | ICD-10-CM | POA: Diagnosis not present

## 2015-11-18 DIAGNOSIS — R000 Tachycardia, unspecified: Secondary | ICD-10-CM | POA: Diagnosis not present

## 2015-11-18 DIAGNOSIS — L89150 Pressure ulcer of sacral region, unstageable: Secondary | ICD-10-CM | POA: Diagnosis not present

## 2015-11-18 DIAGNOSIS — L89620 Pressure ulcer of left heel, unstageable: Secondary | ICD-10-CM | POA: Diagnosis not present

## 2015-11-18 DIAGNOSIS — G309 Alzheimer's disease, unspecified: Secondary | ICD-10-CM | POA: Diagnosis not present

## 2015-11-20 ENCOUNTER — Encounter: Payer: Medicare Other | Admitting: Internal Medicine

## 2015-11-20 DIAGNOSIS — Z880 Allergy status to penicillin: Secondary | ICD-10-CM | POA: Diagnosis not present

## 2015-11-20 DIAGNOSIS — L89212 Pressure ulcer of right hip, stage 2: Secondary | ICD-10-CM | POA: Diagnosis not present

## 2015-11-20 DIAGNOSIS — D649 Anemia, unspecified: Secondary | ICD-10-CM | POA: Diagnosis not present

## 2015-11-20 DIAGNOSIS — L03317 Cellulitis of buttock: Secondary | ICD-10-CM | POA: Diagnosis not present

## 2015-11-20 DIAGNOSIS — Z87891 Personal history of nicotine dependence: Secondary | ICD-10-CM | POA: Diagnosis not present

## 2015-11-20 DIAGNOSIS — E43 Unspecified severe protein-calorie malnutrition: Secondary | ICD-10-CM | POA: Diagnosis not present

## 2015-11-20 DIAGNOSIS — I252 Old myocardial infarction: Secondary | ICD-10-CM | POA: Diagnosis not present

## 2015-11-20 DIAGNOSIS — M1990 Unspecified osteoarthritis, unspecified site: Secondary | ICD-10-CM | POA: Diagnosis not present

## 2015-11-20 DIAGNOSIS — L89154 Pressure ulcer of sacral region, stage 4: Secondary | ICD-10-CM | POA: Diagnosis not present

## 2015-11-20 DIAGNOSIS — G309 Alzheimer's disease, unspecified: Secondary | ICD-10-CM | POA: Diagnosis not present

## 2015-11-20 DIAGNOSIS — L89213 Pressure ulcer of right hip, stage 3: Secondary | ICD-10-CM | POA: Diagnosis not present

## 2015-11-20 DIAGNOSIS — I10 Essential (primary) hypertension: Secondary | ICD-10-CM | POA: Diagnosis not present

## 2015-11-21 DIAGNOSIS — L89159 Pressure ulcer of sacral region, unspecified stage: Secondary | ICD-10-CM | POA: Diagnosis not present

## 2015-11-21 DIAGNOSIS — L89150 Pressure ulcer of sacral region, unstageable: Secondary | ICD-10-CM | POA: Diagnosis not present

## 2015-11-21 NOTE — Progress Notes (Signed)
Watters, PATRICA MENDELL ([id number]) Visit Report for 11/20/2015 Arrival Information Details Patient Name: [name], [name] Date of Service: 11/20/2015 3:45 PM Medical Record Patient Account Number: [id number] Number: Treating RN: [name], RN, [name], [name] 1935-09-16 [phone] y.o. Other Clinician: Date of Birth/Sex: Female) Treating ROBSON, [name] Primary Care Physician: [name] Physician/Extender: G Referring Physician: Thedore Mins, [name] Weeks in Treatment: 5 Visit Information History Since Last Visit Added or deleted any medications: No Patient Arrived: Wheel Chair Any new allergies or adverse reactions: No Arrival Time: 15:57 Had a fall or experienced change in No Accompanied By: dtr activities of daily living that may affect Transfer Assistance: EasyPivot risk of falls: Patient Lift Signs or symptoms of abuse/neglect since last No Patient Identification Verified: Yes visito Secondary Verification Process Yes Hospitalized since last visit: No Completed: Has Dressing in Place as Prescribed: Yes Patient Requires Transmission- No Pain Present Now: No Based Precautions: Patient Has Alerts: No Electronic Signature(s) Signed: 11/20/2015 4:48:42 PM By: Elpidio Eric [name], RN Entered By: Elpidio Eric on 11/20/2015 15:57:20 Brunke, [name] ([id number]) -------------------------------------------------------------------------------- Clinic Level of Care Assessment Details Patient Name: [name], [name] Date of Service: 11/20/2015 3:45 PM Medical Record Patient Account Number: [id number] Number: Treating RN: [name], RN, [name], [name] [birth year] [phone] y.o. Other Clinician: Date of Birth/Sex: Female) Treating ROBSON, [name] Primary Care Physician: [name] Physician/Extender: G Referring Physician: Thedore Mins, [name] Weeks in Treatment: 5 Clinic Level of Care Assessment Items TOOL 4 Quantity Score  - Use when only an EandM is performed on FOLLOW-UP visit  0 ASSESSMENTS - Nursing Assessment / Reassessment X - Reassessment of Co-morbidities (includes updates in patient status) 1 10 X - Reassessment of Adherence to Treatment Plan 1 5 ASSESSMENTS - Wound and Skin Assessment / Reassessment  - Simple Wound Assessment / Reassessment - one wound 0 X - Complex Wound Assessment / Reassessment - multiple wounds 2 5  - Dermatologic / Skin Assessment (not related to wound area) 0 ASSESSMENTS - Focused Assessment  - Circumferential Edema Measurements - multi extremities 0  - Nutritional Assessment / Counseling / Intervention 0  - Lower Extremity Assessment (monofilament, tuning fork, pulses) 0  - Peripheral Arterial Disease Assessment (using hand held doppler) 0 ASSESSMENTS - Ostomy and/or Continence Assessment and Care  - Incontinence Assessment and Management 0  - Ostomy Care Assessment and Management (repouching, etc.) 0 PROCESS - Coordination of Care X - Simple Patient / Family Education for ongoing care 1 15  - Complex (extensive) Patient / Family Education for ongoing care 0  - Staff obtains Chiropractor, Records, Test Results / Process Orders 0 X - Staff telephones HHA, Nursing Homes / Clarify orders / etc 1 10 Bender, Venesa B. ([id number])  - Routine Transfer to another Facility (non-emergent condition) 0  - Routine Hospital Admission (non-emergent condition) 0  - New Admissions / Manufacturing engineer / Ordering NPWT, Apligraf, etc. 0  - Emergency Hospital Admission (emergent condition) 0  - Simple Discharge Coordination 0  - Complex (extensive) Discharge Coordination 0 PROCESS - Special Needs  - Pediatric / Minor Patient Management 0  - Isolation Patient Management 0  - Hearing / Language / Visual special needs 0  - Assessment of Community assistance (transportation, D/C planning, etc.) 0  - Additional assistance / Altered mentation 0  - Support Surface(s) Assessment (bed, cushion, seat, etc.)  0 INTERVENTIONS - Wound Cleansing / Measurement  - Simple Wound Cleansing - one wound 0 X - Complex Wound Cleansing - multiple wounds 2 5 X - Wound  Imaging (photographs - any number of wounds) 1 5 []  - Wound Tracing (instead of photographs) 0 []  - Simple Wound Measurement - one wound 0 X - Complex Wound Measurement - multiple wounds 2 5 INTERVENTIONS - Wound Dressings []  - Small Wound Dressing one or multiple wounds 0 X - Medium Wound Dressing one or multiple wounds 2 15 []  - Large Wound Dressing one or multiple wounds 0 []  - Application of Medications - topical 0 []  - Application of Medications - injection 0 Coufal, Deisy B. ([id number]) INTERVENTIONS - Miscellaneous []  - External ear exam 0 []  - Specimen Collection (cultures, biopsies, blood, body fluids, etc.) 0 []  - Specimen(s) / Culture(s) sent or taken to Lab for analysis 0 X - Patient Transfer (multiple staff / Michiel Sites Lift / Similar devices) 1 10 []  - Simple Staple / Suture removal (25 or less) 0 []  - Complex Staple / Suture removal (26 or more) 0 []  - Hypo / Hyperglycemic Management (close monitor of Blood Glucose) 0 []  - Ankle / Brachial Index (ABI) - do not check if billed separately 0 X - Vital Signs 1 5 Has the patient been seen at the hospital within the last three years: Yes Total Score: 120 Level Of Care: New/Established - Level 4 Electronic Signature(s) Signed: 11/20/2015 4:48:42 PM By: Elpidio Eric [name], RN Entered By: Elpidio Eric on 11/20/2015 16:32:34 Mcgirr, [name] ([id number]) -------------------------------------------------------------------------------- Encounter Discharge Information Details Patient Name: [name], [name] Date of Service: 11/20/2015 3:45 PM Medical Record Patient Account Number: [id number] Number: Treating RN: [name], RN, [name], [name] February 20, 1936 [phone] y.o. Other Clinician: Date of Birth/Sex: Female) Treating ROBSON, [name] Primary Care Physician: [name] Physician/Extender: G Referring Physician: [name] Weeks in Treatment: 5 Encounter Discharge Information Items Schedule Follow-up Appointment: No Medication Reconciliation completed No and provided to Patient/Care Delesha Pohlman: Provided on Clinical Summary of Care: 11/20/2015 Form Type Recipient Paper Patient WJ Electronic Signature(s) Signed: 11/20/2015 4:33:22 PM By: Gwenlyn Perking Entered By: Gwenlyn Perking on 11/20/2015 16:33:22 Rencher, [name] ([id number]) -------------------------------------------------------------------------------- Multi Wound Chart Details Patient Name: Hantz, [name] Date of Service: 11/20/2015 3:45 PM Medical Record Patient Account Number: [id number] Number: Treating RN: [name], RN, [name], [name] [birth year] [phone] y.o. Other Clinician: Date of Birth/Sex: Female) Treating ROBSON, [name] Primary Care Physician: [name] Physician/Extender: G Referring Physician: Thedore Mins, [name] Weeks in Treatment: 5 Vital Signs Height(in): 63 Pulse(bpm): 87 Weight(lbs): Blood Pressure (mmHg): Body Mass Index(BMI): Temperature(F): Respiratory Rate 16 (breaths/min): Photos: [1:No Photos] [2:No Photos] [N/A:N/A] Wound Location: [1:Sacrum] [2:Right Ischial Tuberosity N/A] Wounding Event: [1:Gradually Appeared] [2:Gradually Appeared] [N/A:N/A] Primary Etiology: [1:Pressure Ulcer] [2:Pressure Ulcer] [N/A:N/A] Comorbid History: [1:Cataracts, Anemia, Arrhythmia, Hypertension, Arrhythmia, Hypertension, Myocardial Infarction, History of pressure wounds, Osteoarthritis, Dementia] [2:Cataracts, Anemia, Myocardial Infarction, History of pressure wounds,  Osteoarthritis, Dementia] [N/A:N/A] Date Acquired: [1:09/10/2015] [2:09/10/2015] [N/A:N/A] Weeks of Treatment: [1:5] [2:5] [N/A:N/A] Wound Status: [1:Open] [2:Open] [N/A:N/A] Measurements L x W x D 3x2.5x2.4 [2:1x0.7x0.1] [N/A:N/A] (cm) Area (cm) : [1:5.89] [2:0.55] [N/A:N/A] Volume (cm) :  [1:14.137] [2:0.055] [N/A:N/A] % Reduction in Area: [1:60.90%] [2:29.90%] [N/A:N/A] % Reduction in Volume: 79.20% [2:30.40%] [N/A:N/A] Classification: [1:Category/Stage IV] [2:Category/Stage II] [N/A:N/A] Exudate Amount: [1:Large] [2:Medium] [N/A:N/A] Exudate Type: [1:Serosanguineous] [2:Serosanguineous] [N/A:N/A] Exudate Color: [1:red, brown] [2:red, brown] [N/A:N/A] Foul Odor After [1:Yes] [2:No] [N/A:N/A] Cleansing: Odor Anticipated Due to No [2:N/A] [N/A:N/A] Product Use: Wound Margin: [1:Thickened] [2:Distinct, outline attached N/A] Granulation Amount: Large (67-100%) Large (67-100%) N/A Granulation Quality: Red Pink N/A Necrotic Amount: None Present (0%) None Present (0%) N/A Exposed Structures: Fascia:  No Fascia: No N/A Fat: No Fat: No Tendon: No Tendon: No Muscle: No Muscle: No Joint: No Joint: No Bone: No Bone: No Limited to Skin Limited to Skin Breakdown Breakdown Epithelialization: None None N/A Periwound Skin Texture: Edema: No Edema: No N/A Excoriation: No Excoriation: No Induration: No Induration: No Callus: No Callus: No Crepitus: No Crepitus: No Fluctuance: No Fluctuance: No Friable: No Friable: No Rash: No Rash: No Scarring: No Scarring: No Periwound Skin Moist: Yes Moist: Yes N/A Moisture: Maceration: No Maceration: No Dry/Scaly: No Dry/Scaly: No Periwound Skin Color: Atrophie Blanche: No Atrophie Blanche: No N/A Cyanosis: No Cyanosis: No Ecchymosis: No Ecchymosis: No Erythema: No Erythema: No Hemosiderin Staining: No Hemosiderin Staining: No Mottled: No Mottled: No Pallor: No Pallor: No Rubor: No Rubor: No Temperature: No Abnormality No Abnormality N/A Tenderness on Yes Yes N/A Palpation: Wound Preparation: Ulcer Cleansing: Ulcer Cleansing: N/A Rinsed/Irrigated with Rinsed/Irrigated with Saline Saline Topical Anesthetic Topical Anesthetic Applied: Other: lidocaine Applied: Other: lidocaine 4% 4% Treatment  Notes Electronic Signature(s) Signed: 11/20/2015 4:48:42 PM By: Elpidio Eric [name], RN Entered By: Elpidio Eric on 11/20/2015 16:06:57 Juhasz, [name] ([id number]) Pollard, [name] ([id number]) -------------------------------------------------------------------------------- Multi-Disciplinary Care Plan Details Patient Name: Lamphier, [name] Date of Service: 11/20/2015 3:45 PM Medical Record Patient Account Number: [id number] Number: Treating RN: [name], RN, [name], [name] 1936/02/13 [phone] y.o. Other Clinician: Date of Birth/Sex: Female) Treating ROBSON, [name] Primary Care Physician: [name] Physician/Extender: G Referring Physician: Thedore Mins, [name] Weeks in Treatment: 5 Active Inactive Abuse / Safety / Falls / Self Care Management Nursing Diagnoses: Impaired home maintenance Impaired physical mobility Knowledge deficit related to: safety; personal, health (wound), emergency Potential for falls Self care deficit: actual or potential Goals: Patient will remain injury free Date Initiated: 10/15/2015 Goal Status: Active Patient/caregiver will verbalize understanding of skin care regimen Date Initiated: 10/15/2015 Goal Status: Active Patient/caregiver will verbalize/demonstrate measure taken to improve self care Date Initiated: 10/15/2015 Goal Status: Active Patient/caregiver will verbalize/demonstrate measures taken to improve the patient's personal safety Date Initiated: 10/15/2015 Goal Status: Active Patient/caregiver will verbalize/demonstrate measures taken to prevent injury and/or falls Date Initiated: 10/15/2015 Goal Status: Active Patient/caregiver will verbalize/demonstrate understanding of what to do in case of emergency Date Initiated: 10/15/2015 Goal Status: Active Interventions: Assess fall risk on admission and as needed Assess: immobility, friction, shearing, incontinence upon admission and as needed Assess impairment of mobility on admission and as  needed per policy Nine, Niara B. ([id number]) Assess self care needs on admission and as needed Provide education on basic hygiene Provide education on fall prevention Provide education on personal and home safety Provide education on safe transfers Treatment Activities: Education provided on Basic Hygiene : 10/15/2015 Patient referred to home care : 11/20/2015 Notes: Nutrition Nursing Diagnoses: Imbalanced nutrition Impaired glucose control: actual or potential Potential for alteratiion in Nutrition/Potential for imbalanced nutrition Goals: Patient/caregiver agrees to and verbalizes understanding of need to obtain nutritional consultation Date Initiated: 10/15/2015 Goal Status: Active Patient/caregiver agrees to and verbalizes understanding of need to use nutritional supplements and/or vitamins as prescribed Date Initiated: 10/15/2015 Goal Status: Active Patient/caregiver verbalizes understanding of need to maintain therapeutic glucose control per primary care physician Date Initiated: 10/15/2015 Goal Status: Active Interventions: Assess patient nutrition upon admission and as needed per policy Provide education on elevated blood sugars and impact on wound healing Provide education on nutrition Treatment Activities: Education provided on Nutrition : 10/15/2015 Notes: Orientation to the Wound Care Program Nursing Diagnoses: Knowledge deficit related to the wound healing center program Mijangos,  [name] ([id number]) Goals: Patient/caregiver will verbalize understanding of the Wound Healing Center Program Date Initiated: 10/15/2015 Goal Status: Active Interventions: Provide education on orientation to the wound center Notes: Pressure Nursing Diagnoses: Knowledge deficit related to causes and risk factors for pressure ulcer development Knowledge deficit related to management of pressures ulcers Potential for impaired tissue integrity related to pressure, friction, moisture,  and shear Goals: Patient will remain free from development of additional pressure ulcers Date Initiated: 10/15/2015 Goal Status: Active Patient will remain free of pressure ulcers Date Initiated: 10/15/2015 Goal Status: Active Patient/caregiver will verbalize risk factors for pressure ulcer development Date Initiated: 10/15/2015 Goal Status: Active Patient/caregiver will verbalize understanding of pressure ulcer management Date Initiated: 10/15/2015 Goal Status: Active Interventions: Assess: immobility, friction, shearing, incontinence upon admission and as needed Assess offloading mechanisms upon admission and as needed Assess potential for pressure ulcer upon admission and as needed Provide education on pressure ulcers Treatment Activities: Patient referred for home evaluation of offloading devices/mattresses : 11/20/2015 Patient referred for pressure reduction/relief devices : 11/20/2015 Patient referred for seating evaluation to ensure proper offloading : 11/20/2015 Pressure reduction/relief device ordered : 11/20/2015 Notes: Knies, [name] ([id number]) Wound/Skin Impairment Nursing Diagnoses: Impaired tissue integrity Knowledge deficit related to smoking impact on wound healing Knowledge deficit related to ulceration/compromised skin integrity Goals: Patient/caregiver will verbalize understanding of skin care regimen Date Initiated: 10/15/2015 Goal Status: Active Ulcer/skin breakdown will have a volume reduction of 30% by week 4 Date Initiated: 10/15/2015 Goal Status: Active Ulcer/skin breakdown will have a volume reduction of 50% by week 8 Date Initiated: 10/15/2015 Goal Status: Active Ulcer/skin breakdown will have a volume reduction of 80% by week 12 Date Initiated: 10/15/2015 Goal Status: Active Ulcer/skin breakdown will heal within 14 weeks Date Initiated: 10/15/2015 Goal Status: Active Interventions: Assess patient/caregiver ability to obtain necessary  supplies Assess patient/caregiver ability to perform ulcer/skin care regimen upon admission and as needed Assess ulceration(s) every visit Provide education on ulcer and skin care Treatment Activities: Patient referred to home care : 11/20/2015 Skin care regimen initiated : 11/20/2015 Notes: Electronic Signature(s) Signed: 11/20/2015 4:48:42 PM By: Elpidio Eric [name], RN Entered By: Elpidio Eric on 11/20/2015 16:06:27 Duck, [name] ([id number]) -------------------------------------------------------------------------------- Pain Assessment Details Patient Name: Glacken, [name] Date of Service: 11/20/2015 3:45 PM Medical Record Patient Account Number: [id number] Number: Treating RN: [name], RN, [name], [name] 1936-04-20 [phone] y.o. Other Clinician: Date of Birth/Sex: Female) Treating ROBSON, [name] Primary Care Physician: [name] Physician/Extender: G Referring Physician: Thedore Mins, [name] Weeks in Treatment: 5 Active Problems Location of Pain Severity and Description of Pain Patient Has Paino No Site Locations Pain Management and Medication Current Pain Management: Electronic Signature(s) Signed: 11/20/2015 4:48:42 PM By: Elpidio Eric [name], RN Entered By: Elpidio Eric on 11/20/2015 15:57:28 Bovey, [name] ([id number]) -------------------------------------------------------------------------------- Wound Assessment Details Patient Name: Kyer, [name] Date of Service: 11/20/2015 3:45 PM Medical Record Patient Account Number: [id number] Number: Treating RN: [name], RN, [name], [name] [birth year] [phone] y.o. Other Clinician: Date of Birth/Sex: Female) Treating ROBSON, [name] Primary Care Physician: [name] Physician/Extender: G Referring Physician: Thedore Mins, [name] Weeks in Treatment: 5 Wound Status Wound Number: 1 Primary Pressure Ulcer Etiology: Wound Location: Sacrum Wound Open Wounding Event: Gradually Appeared Status: Date Acquired:  09/10/2015 Comorbid Cataracts, Anemia, Arrhythmia, Weeks Of Treatment: 5 History: Hypertension, Myocardial Infarction, Clustered Wound: No History of pressure wounds, Osteoarthritis, Dementia Photos Photo Uploaded By: Elpidio Eric on 11/20/2015 16:45:32 Wound Measurements Length: (cm) 3 Width: (cm) 2.5 Depth: (cm) 2.4 Area: (cm)  5.89 Volume: (cm) 14.137 % Reduction in Area: 60.9% % Reduction in Volume: 79.2% Epithelialization: None Tunneling: No Undermining: No Wound Description Classification: Category/Stage IV Wound Margin: Thickened Exudate Amount: Large Exudate Type: Serosanguineous Exudate Color: red, brown Foul Odor After Cleansing: Yes Due to Product Use: No Wound Bed Granulation Amount: Large (67-100%) Exposed Structure Pickrell, Geanette B. ([id number]) Granulation Quality: Red Fascia Exposed: No Necrotic Amount: None Present (0%) Fat Layer Exposed: No Tendon Exposed: No Muscle Exposed: No Joint Exposed: No Bone Exposed: No Limited to Skin Breakdown Periwound Skin Texture Texture Color No Abnormalities Noted: No No Abnormalities Noted: No Callus: No Atrophie Blanche: No Crepitus: No Cyanosis: No Excoriation: No Ecchymosis: No Fluctuance: No Erythema: No Friable: No Hemosiderin Staining: No Induration: No Mottled: No Localized Edema: No Pallor: No Rash: No Rubor: No Scarring: No Temperature / Pain Moisture Temperature: No Abnormality No Abnormalities Noted: No Tenderness on Palpation: Yes Dry / Scaly: No Maceration: No Moist: Yes Wound Preparation Ulcer Cleansing: Rinsed/Irrigated with Saline Topical Anesthetic Applied: Other: lidocaine 4%, Electronic Signature(s) Signed: 11/20/2015 4:48:42 PM By: Elpidio EricAfful, [name], RN Entered By: Elpidio EricAfful, [name] on 11/20/2015 16:05:57 Kerby, [name] ([id number]) -------------------------------------------------------------------------------- Wound Assessment Details Patient Name: Petta, [name] Date  of Service: 11/20/2015 3:45 PM Medical Record Patient Account Number: [id number] Number: Treating RN: Clover MealyAfful, RN, [name], [name] 05/25/1936 [phone](79 y.o. Other Clinician: Date of Birth/Sex: Female) Treating ROBSON, [name] Primary Care Physician: Leotis ShamesSINGH, [name] Physician/Extender: G Referring Physician: Thedore MinsSINGH, [name] Weeks in Treatment: 5 Wound Status Wound Number: 2 Primary Pressure Ulcer Etiology: Wound Location: Right Ischial Tuberosity Wound Open Wounding Event: Gradually Appeared Status: Date Acquired: 09/10/2015 Comorbid Cataracts, Anemia, Arrhythmia, Weeks Of Treatment: 5 History: Hypertension, Myocardial Infarction, Clustered Wound: No History of pressure wounds, Osteoarthritis, Dementia Photos Photo Uploaded By: Elpidio EricAfful, [name] on 11/20/2015 16:45:44 Wound Measurements Length: (cm) 1 Width: (cm) 0.7 Depth: (cm) 0.1 Area: (cm) 0.55 Volume: (cm) 0.055 % Reduction in Area: 29.9% % Reduction in Volume: 30.4% Epithelialization: None Tunneling: No Undermining: No Wound Description Classification: Category/Stage II Wound Margin: Distinct, outline attached Exudate Amount: Medium Exudate Type: Serosanguineous Exudate Color: red, brown Foul Odor After Cleansing: No Wound Bed Granulation Amount: Large (67-100%) Exposed Structure Mihalko, Miyoko B. ([id number]) Granulation Quality: Pink Fascia Exposed: No Necrotic Amount: None Present (0%) Fat Layer Exposed: No Tendon Exposed: No Muscle Exposed: No Joint Exposed: No Bone Exposed: No Limited to Skin Breakdown Periwound Skin Texture Texture Color No Abnormalities Noted: No No Abnormalities Noted: No Callus: No Atrophie Blanche: No Crepitus: No Cyanosis: No Excoriation: No Ecchymosis: No Fluctuance: No Erythema: No Friable: No Hemosiderin Staining: No Induration: No Mottled: No Localized Edema: No Pallor: No Rash: No Rubor: No Scarring: No Temperature / Pain Moisture Temperature: No Abnormality No  Abnormalities Noted: No Tenderness on Palpation: Yes Dry / Scaly: No Maceration: No Moist: Yes Wound Preparation Ulcer Cleansing: Rinsed/Irrigated with Saline Topical Anesthetic Applied: Other: lidocaine 4%, Electronic Signature(s) Signed: 11/20/2015 4:48:42 PM By: Elpidio EricAfful, [name], RN Entered By: Elpidio EricAfful, [name] on 11/20/2015 16:06:17 Custis, [name] ([id number]) -------------------------------------------------------------------------------- Vitals Details Patient Name: Strite, [name] Date of Service: 11/20/2015 3:45 PM Medical Record Patient Account Number: [id number] Number: Treating RN: Clover MealyAfful, RN, [name], [name] 05/25/[phone](79 y.o. Other Clinician: Date of Birth/Sex: Female) Treating ROBSON, [name] Primary Care Physician: Leotis ShamesSINGH, [name] Physician/Extender: G Referring Physician: Thedore MinsSINGH, [name] Weeks in Treatment: 5 Vital Signs Time Taken: 15:57 Pulse (bpm): 87 Height (in): 63 Respiratory Rate (breaths/min): 16 Reference Range: 80 - 120 mg / dl Electronic Signature(s)  Signed: 11/20/2015 4:48:42 PM By: Elpidio Eric [name], RN Entered By: Elpidio Eric on 11/20/2015 16:01:26

## 2015-11-21 NOTE — Progress Notes (Signed)
[name] ([id number]) Visit Report for 11/20/2015 Chief Complaint Document Details Patient Name: [name], [name] Date of Service: 11/20/2015 3:45 PM Medical Record Patient Account Number: [id number] Number: Treating RN: [name], RN, [name], [name] [birth year] [phone] y.o. Other Clinician: Date of Birth/Sex: Female) Treating ROBSON, [name] Primary Care Physician/Extender: [name], [name] Physician: Referring Physician: [name] Weeks in Treatment: 5 Information Obtained from: Patient Chief Complaint The patient is here for a sacral decubitus ulcer also a more superficial wound over her right hip Electronic Signature(s) Signed: 11/20/2015 5:01:27 PM By: Baltazar Najjar MD Entered By: Baltazar Najjar on 11/20/2015 16:16:40 Berkland, [name] ([id number]) -------------------------------------------------------------------------------- HPI Details Patient Name: Ernest, [name] Date of Service: 11/20/2015 3:45 PM Medical Record Patient Account Number: [id number] Number: Treating RN: [name], RN, [name], [name] 1935-10-04 [phone] y.o. Other Clinician: Date of Birth/Sex: Female) Treating ROBSON, [name] Primary Care Physician/Extender: [name], [name] Physician: Referring Physician: [name] Weeks in Treatment: 5 History of Present Illness HPI Description: 10/15/15; this is a very frail lady with advanced Alzheimer's disease who is being cared for aerobically at home by her daughter and granddaughter. She is essentially nonambulatory and seems to be minimally verbal. They tell me that she started to develop a pressure area in her sacral area in January. Initially a very small open area that progressed up. There is also a more recent area on the right hip. The patient was seen in her primary office for this problem on 2/23 at that point noted to have a stage III wound with tunneling over the sacrum. There were 2 other areas noted on the upper left and right  bilateral occipital. As well as a stage II on the right trochanter. She was referred to the ER because of tachycardia. She was hospitalized from 2/23 through 2/25. The admission this seems to open dominated by a distal fecal impaction. She was noted to have sinus tachycardia which improved. She did have a CT scan of the abdomen and pelvis during the hospitalization. In addition to the fecal impaction this noted the ulcer in the left lower gluteal region adjacent to the midline. There was air in the subcutaneous wound but no evidence of an air-fluid level to suggest an abscess. There was no comment about osteomyelitis. Since her return home she has not been eating well but is drinking. The family provides total care. I note that her albumin was 3 on admission to the hospital. Sodium got as high as 148 but was normalized by the time she left 10/24/15; this is a patient who has advanced dementia. She has a deep probing stage IV pressure ulcer over her lower sacral area. Cultures of this last week grew both Proteus and methicillin-resistant staph aureus. I and empirically put her on Cipro and doxycycline which should cover both of these. He also has a wound over her right greater trochanter. She is been followed by home health at home they're requesting lab work which I think is reasonable. I ordered a CMP, CBC with differential, sedimentation rate and a C-reactive protein 10/30/15 the patient's labs were done by home health but I think they faxed the orders to the primary physician's office. The plain x-ray I did did not show osteomyelitis however it did show fractures of the right superior and inferior pubic rami which are still not fully healed. I informed the patient's family of this. They do give a history of a fall while she was at an assisted living in [place] in November afterward  she had some trouble walking this may have been the issue. 11/13/15 the wound VAC only started 6 days ago. She is  apparently eating well for/19/17; the patient continues with the wound VAC without any difficulties. This is being changed by home health. She is apparently eating well Electronic Signature(s) Signed: 11/20/2015 5:01:27 PM By: Baltazar Najjar MD Entered By: Baltazar Najjar on 11/20/2015 16:17:56 Dao, [name] ([id number]) Rundquist, [name] ([id number]) -------------------------------------------------------------------------------- Physical Exam Details Patient Name: Fritcher, [name] Date of Service: 11/20/2015 3:45 PM Medical Record Patient Account Number: [id number] Number: Treating RN: [name], RN, [name], [name] 26-Jul-1936 [phone] y.o. Other Clinician: Date of Birth/Sex: Female) Treating ROBSON, [name] Primary Care Physician/Extender: [name], [name] Physician: Referring Physician: Thedore Mins, [name] Weeks in Treatment: 5 Respiratory Respiratory effort is easy and symmetric bilaterally. Rate is normal at rest and on room air.. Bilateral breath sounds are clear and equal in all lobes with no wheezes, rales or rhonchi.. Cardiovascular Heart rhythm and rate regular, without murmur or gallop. Appears euvolemic. Gastrointestinal (GI) Abdomen is soft and non-distended without masses or tenderness. Bowel sounds active in all quadrants.. No liver or spleen enlargement or tenderness.. Genitourinary (GU) Bladder without fullness, masses or tenderness.. Notes Wound exam; the area over the right greater trochanter is perhaps slightly smaller, friable granulation tissue. The stage IV wound looks remarkably better with now no exposed bone, healthy granulation tissue. She has been on a wound VAC for roughly 2 weeks Electronic Signature(s) Signed: 11/20/2015 5:01:27 PM By: Baltazar Najjar MD Entered By: Baltazar Najjar on 11/20/2015 16:35:04 Roettger, [name] ([id number]) -------------------------------------------------------------------------------- Physician Orders Details Patient  Name: Rossy, [name] Date of Service: 11/20/2015 3:45 PM Medical Record Patient Account Number: [id number] Number: Treating RN: [name], RN, [name], [name] October 16, 1935 [phone] y.o. Other Clinician: Date of Birth/Sex: Female) Treating ROBSON, [name] Primary Care Physician/Extender: [name], [name] Physician: Referring Physician: [name] Weeks in Treatment: 5 Verbal / Phone Orders: Yes Clinician: [name], RN, [name], [name] Read Back and Verified: Yes Diagnosis Coding Wound Cleansing Wound #1 Sacrum o Clean wound with Normal Saline. Wound #2 Right Ischial Tuberosity o Clean wound with Normal Saline. Anesthetic Wound #1 Sacrum o Topical Lidocaine 4% cream applied to wound bed prior to debridement Wound #2 Right Ischial Tuberosity o Topical Lidocaine 4% cream applied to wound bed prior to debridement Skin Barriers/Peri-Wound Care Wound #1 Sacrum o Skin Prep Wound #2 Right Ischial Tuberosity o Skin Prep Primary Wound Dressing Wound #1 Sacrum o Prisma Ag - use collagen with silver under foam for NPWT Wound #2 Right Ischial Tuberosity o Aquacel Ag Secondary Dressing Wound #2 Right Ischial Tuberosity o Boardered Foam Dressing Dressing Change Frequency Morici, Tykeshia B. ([id number]) Wound #1 Sacrum o Change dressing every other day. o Change Dressing Monday, Wednesday, Friday Wound #2 Right Ischial Tuberosity o Change dressing every other day. - may change daily as needed for excess drainage o Change Dressing Monday, Wednesday, Friday Follow-up Appointments Wound #1 Sacrum o Return Appointment in 2 weeks. Wound #2 Right Ischial Tuberosity o Return Appointment in 2 weeks. Off-Loading Wound #1 Sacrum o Roho cushion for wheelchair - To be ordered by Home health o Turn and reposition every 2 hours o Mattress [facility] bed ordered by [facility] Wound #2 Right Ischial Tuberosity o Roho cushion for wheelchair - To be  ordered by Home health o Turn and reposition every 2 hours o Mattress [facility] bed ordered by [facility] Additional Orders / Instructions Wound #1 Sacrum o  Increase protein intake. o Other: - Supplements: Vitamin C, MVI, Zinc Wound #2 Right Ischial Tuberosity o Increase protein intake. o Other: - Supplements: Vitamin C, MVI, Zinc Home Health Wound #1 Sacrum o Continue Home Health Visits - [facility] o Home Health Nurse may visit PRN to address patientos wound care needs. o FACE TO FACE ENCOUNTER: MEDICARE and MEDICAID PATIENTS: I certify that this patient is under my care and that I had a face-to-face encounter that meets the physician face-to-face encounter requirements with this patient on this date. The encounter with the patient was in whole or in part for the following MEDICAL CONDITION: (primary reason for Home Healthcare) MEDICAL NECESSITY: I certify, that based on my findings, NURSING services are a medically necessary home health service. HOME BOUND STATUS: I certify that my clinical findings support that this patient is homebound (i.e., Due to illness or injury, pt requires aid of supportive devices such as crutches, cane, wheelchairs, walkers, the use of special Splinter, Violeta B. ([id number]) transportation or the assistance of another person to leave their place of residence. There is a normal inability to leave the home and doing so requires considerable and taxing effort. Other absences are for medical reasons / religious services and are infrequent or of short duration when for other reasons). o If current dressing causes regression in wound condition, may D/C ordered dressing product/s and apply Normal Saline Moist Dressing daily until next Wound Healing Center / Other MD appointment. Notify Wound Healing Center of regression in wound condition at [phone]. o Please direct any NON-WOUND related issues/requests for orders to  patient's Primary Care Physician Wound #2 Right Ischial Tuberosity o Continue Home Health Visits - [facility] o Home Health Nurse may visit PRN to address patientos wound care needs. o FACE TO FACE ENCOUNTER: MEDICARE and MEDICAID PATIENTS: I certify that this patient is under my care and that I had a face-to-face encounter that meets the physician face-to-face encounter requirements with this patient on this date. The encounter with the patient was in whole or in part for the following MEDICAL CONDITION: (primary reason for Home Healthcare) MEDICAL NECESSITY: I certify, that based on my findings, NURSING services are a medically necessary home health service. HOME BOUND STATUS: I certify that my clinical findings support that this patient is homebound (i.e., Due to illness or injury, pt requires aid of supportive devices such as crutches, cane, wheelchairs, walkers, the use of special transportation or the assistance of another person to leave their place of residence. There is a normal inability to leave the home and doing so requires considerable and taxing effort. Other absences are for medical reasons / religious services and are infrequent or of short duration when for other reasons). o If current dressing causes regression in wound condition, may D/C ordered dressing product/s and apply Normal Saline Moist Dressing daily until next Wound Healing Center / Other MD appointment. Notify Wound Healing Center of regression in wound condition at [phone]. o Please direct any NON-WOUND related issues/requests for orders to patient's Primary Care Physician Negative Pressure Wound Therapy Wound #1 Sacrum o Wound VAC settings at 125/130 mmHg continuous pressure. Use BLACK/GREEN foam to wound cavity. Use WHITE foam to fill any tunnel/s and/or undermining. Change VAC dressing 3 X WEEK. Change canister as indicated when full. Nurse may titrate settings and frequency  of dressing changes as clinically indicated. - HHRN to order NPWT for patient and initiate once it is available [facility] Nurse may d/c  VAC for s/s of increased infection, significant wound regression, or uncontrolled drainage. Notify Wound Healing Center at [phone]. o Apply contact layer over base of wound. - apply collagen with silver to wound base under foam Electronic Signature(s) Signed: 11/20/2015 4:48:42 PM By: Elpidio EricAfful, [name], RN Signed: 11/20/2015 5:01:27 PM By: Baltazar Najjarobson, [name] MD Entered By: Elpidio EricAfful, [name] on 11/20/2015 16:29:39 Leber, [name] ([id number]) Kyne, [name] ([id number]) -------------------------------------------------------------------------------- Problem List Details Patient Name: Prins, Ariana B. Date of Service: 11/20/2015 3:45 PM Medical Record Patient Account Number: [id number] Number: Treating RN: Clover MealyAfful, RN, [name], [name] 1935/08/21 [phone](79 y.o. Other Clinician: Date of Birth/Sex: Female) Treating ROBSON, [name] Primary Care Physician/Extender: Marlana LatusG SINGH, [name] Physician: Referring Physician: Leotis ShamesSINGH, [name] Weeks in Treatment: 5 Active Problems ICD-10 Encounter Code Description Active Date Diagnosis L89.154 Pressure ulcer of sacral region, stage 4 10/15/2015 Yes L03.317 Cellulitis of buttock 10/15/2015 Yes E43 Unspecified severe protein-calorie malnutrition 10/15/2015 Yes L89.213 Pressure ulcer of right hip, stage 3 10/15/2015 Yes Inactive Problems Resolved Problems Electronic Signature(s) Signed: 11/20/2015 5:01:27 PM By: Baltazar Najjarobson, [name] MD Entered By: Baltazar Najjarobson, [name] on 11/20/2015 16:16:30 Hermida, [name] ([id number]) -------------------------------------------------------------------------------- Progress Note Details Patient Name: Geter, Vernica B. Date of Service: 11/20/2015 3:45 PM Medical Record Patient Account Number: [id number] Number: Treating RN: Clover MealyAfful, RN, [name], [name] 1935/08/21 [phone](79 y.o. Other  Clinician: Date of Birth/Sex: Female) Treating ROBSON, [name] Primary Care Physician/Extender: Marlana LatusG SINGH, [name] Physician: Referring Physician: Leotis ShamesSINGH, [name] Weeks in Treatment: 5 Subjective Chief Complaint Information obtained from Patient The patient is here for a sacral decubitus ulcer also a more superficial wound over her right hip History of Present Illness (HPI) 10/15/15; this is a very frail lady with advanced Alzheimer's disease who is being cared for aerobically at home by her daughter and granddaughter. She is essentially nonambulatory and seems to be minimally verbal. They tell me that she started to develop a pressure area in her sacral area in January. Initially a very small open area that progressed up. There is also a more recent area on the right hip. The patient was seen in her primary office for this problem on 2/23 at that point noted to have a stage III wound with tunneling over the sacrum. There were 2 other areas noted on the upper left and right bilateral occipital. As well as a stage II on the right trochanter. She was referred to the ER because of tachycardia. She was hospitalized from 2/23 through 2/25. The admission this seems to open dominated by a distal fecal impaction. She was noted to have sinus tachycardia which improved. She did have a CT scan of the abdomen and pelvis during the hospitalization. In addition to the fecal impaction this noted the ulcer in the left lower gluteal region adjacent to the midline. There was air in the subcutaneous wound but no evidence of an air-fluid level to suggest an abscess. There was no comment about osteomyelitis. Since her return home she has not been eating well but is drinking. The family provides total care. I note that her albumin was 3 on admission to the hospital. Sodium got as high as 148 but was normalized by the time she left 10/24/15; this is a patient who has advanced dementia. She has a deep probing stage IV  pressure ulcer over her lower sacral area. Cultures of this last week grew both Proteus and methicillin-resistant staph aureus. I and empirically put her on Cipro and doxycycline which should cover both of these. He also has a wound over her  right greater trochanter. She is been followed by home health at home they're requesting lab work which I think is reasonable. I ordered a CMP, CBC with differential, sedimentation rate and a C-reactive protein 10/30/15 the patient's labs were done by home health but I think they faxed the orders to the primary physician's office. The plain x-ray I did did not show osteomyelitis however it did show fractures of the right superior and inferior pubic rami which are still not fully healed. I informed the patient's family of this. They do give a history of a fall while she was at an assisted living in [place] in November afterward she had some trouble walking this may have been the issue. 11/13/15 the wound VAC only started 6 days ago. She is apparently eating well for/19/17; the patient continues with the wound VAC without any difficulties. This is being changed by home health. She is apparently eating well Capek, Devri B. ([id number]) Objective Constitutional Vitals Time Taken: 3:57 PM, Height: 63 in, Pulse: 87 bpm, Respiratory Rate: 16 breaths/min. Integumentary (Hair, Skin) Wound #1 status is Open. Original cause of wound was Gradually Appeared. The wound is located on the Sacrum. The wound measures 3cm length x 2.5cm width x 2.4cm depth; 5.89cm^2 area and 14.137cm^3 volume. The wound is limited to skin breakdown. There is no tunneling or undermining noted. There is a large amount of serosanguineous drainage noted. The wound margin is thickened. There is large (67- 100%) red granulation within the wound bed. There is no necrotic tissue within the wound bed. The periwound skin appearance exhibited: Moist. The periwound skin appearance did not exhibit:  Callus, Crepitus, Excoriation, Fluctuance, Friable, Induration, Localized Edema, Rash, Scarring, Dry/Scaly, Maceration, Atrophie Blanche, Cyanosis, Ecchymosis, Hemosiderin Staining, Mottled, Pallor, Rubor, Erythema. Periwound temperature was noted as No Abnormality. The periwound has tenderness on palpation. Wound #2 status is Open. Original cause of wound was Gradually Appeared. The wound is located on the Right Ischial Tuberosity. The wound measures 1cm length x 0.7cm width x 0.1cm depth; 0.55cm^2 area and 0.055cm^3 volume. The wound is limited to skin breakdown. There is no tunneling or undermining noted. There is a medium amount of serosanguineous drainage noted. The wound margin is distinct with the outline attached to the wound base. There is large (67-100%) pink granulation within the wound bed. There is no necrotic tissue within the wound bed. The periwound skin appearance exhibited: Moist. The periwound skin appearance did not exhibit: Callus, Crepitus, Excoriation, Fluctuance, Friable, Induration, Localized Edema, Rash, Scarring, Dry/Scaly, Maceration, Atrophie Blanche, Cyanosis, Ecchymosis, Hemosiderin Staining, Mottled, Pallor, Rubor, Erythema. Periwound temperature was noted as No Abnormality. The periwound has tenderness on palpation. Assessment Active Problems ICD-10 L89.154 - Pressure ulcer of sacral region, stage 4 L03.317 - Cellulitis of buttock E43 - Unspecified severe protein-calorie malnutrition L89.213 - Pressure ulcer of right hip, stage 3 Sublett, Emberlee B. ([id number]) Plan Wound Cleansing: Wound #1 Sacrum: Clean wound with Normal Saline. Wound #2 Right Ischial Tuberosity: Clean wound with Normal Saline. Anesthetic: Wound #1 Sacrum: Topical Lidocaine 4% cream applied to wound bed prior to debridement Wound #2 Right Ischial Tuberosity: Topical Lidocaine 4% cream applied to wound bed prior to debridement Skin Barriers/Peri-Wound Care: Wound #1 Sacrum: Skin  Prep Wound #2 Right Ischial Tuberosity: Skin Prep Primary Wound Dressing: Wound #1 Sacrum: Prisma Ag - use collagen with silver under foam for NPWT Wound #2 Right Ischial Tuberosity: Aquacel Ag Secondary Dressing: Wound #2 Right Ischial Tuberosity: Boardered Foam Dressing Dressing Change Frequency: Wound #1 Sacrum: Change  dressing every other day. Change Dressing Monday, Wednesday, Friday Wound #2 Right Ischial Tuberosity: Change dressing every other day. - may change daily as needed for excess drainage Change Dressing Monday, Wednesday, Friday Follow-up Appointments: Wound #1 Sacrum: Return Appointment in 2 weeks. Wound #2 Right Ischial Tuberosity: Return Appointment in 2 weeks. Off-Loading: Wound #1 Sacrum: Roho cushion for wheelchair - To be ordered by Home health Turn and reposition every 2 hours Mattress [facility] bed ordered by [facility] Wound #2 Right Ischial Tuberosity: Roho cushion for wheelchair - To be ordered by Home health Turn and reposition every 2 hours Mattress [facility] bed ordered by [facility] Additional Orders / Instructions: Inks, Navie B. ([id number]) Wound #1 Sacrum: Increase protein intake. Other: - Supplements: Vitamin C, MVI, Zinc Wound #2 Right Ischial Tuberosity: Increase protein intake. Other: - Supplements: Vitamin C, MVI, Zinc Home Health: Wound #1 Sacrum: Continue Home Health Visits - [facility] Home Health Nurse may visit PRN to address patient s wound care needs. FACE TO FACE ENCOUNTER: MEDICARE and MEDICAID PATIENTS: I certify that this patient is under my care and that I had a face-to-face encounter that meets the physician face-to-face encounter requirements with this patient on this date. The encounter with the patient was in whole or in part for the following MEDICAL CONDITION: (primary reason for Home Healthcare) MEDICAL NECESSITY: I certify, that based on my findings, NURSING services are a  medically necessary home health service. HOME BOUND STATUS: I certify that my clinical findings support that this patient is homebound (i.e., Due to illness or injury, pt requires aid of supportive devices such as crutches, cane, wheelchairs, walkers, the use of special transportation or the assistance of another person to leave their place of residence. There is a normal inability to leave the home and doing so requires considerable and taxing effort. Other absences are for medical reasons / religious services and are infrequent or of short duration when for other reasons). If current dressing causes regression in wound condition, may D/C ordered dressing product/s and apply Normal Saline Moist Dressing daily until next Wound Healing Center / Other MD appointment. Notify Wound Healing Center of regression in wound condition at [phone]. Please direct any NON-WOUND related issues/requests for orders to patient's Primary Care Physician Wound #2 Right Ischial Tuberosity: Continue Home Health Visits - [facility] Home Health Nurse may visit PRN to address patient s wound care needs. FACE TO FACE ENCOUNTER: MEDICARE and MEDICAID PATIENTS: I certify that this patient is under my care and that I had a face-to-face encounter that meets the physician face-to-face encounter requirements with this patient on this date. The encounter with the patient was in whole or in part for the following MEDICAL CONDITION: (primary reason for Home Healthcare) MEDICAL NECESSITY: I certify, that based on my findings, NURSING services are a medically necessary home health service. HOME BOUND STATUS: I certify that my clinical findings support that this patient is homebound (i.e., Due to illness or injury, pt requires aid of supportive devices such as crutches, cane, wheelchairs, walkers, the use of special transportation or the assistance of another person to leave their place of residence. There is a normal  inability to leave the home and doing so requires considerable and taxing effort. Other absences are for medical reasons / religious services and are infrequent or of short duration when for other reasons). If current dressing causes regression in wound condition, may D/C ordered dressing product/s and apply Normal Saline  Moist Dressing daily until next Wound Healing Center / Other MD appointment. Notify Wound Healing Center of regression in wound condition at [phone]. Please direct any NON-WOUND related issues/requests for orders to patient's Primary Care Physician Negative Pressure Wound Therapy: Wound #1 Sacrum: Wound VAC settings at 125/130 mmHg continuous pressure. Use BLACK/GREEN foam to wound cavity. Use WHITE foam to fill any tunnel/s and/or undermining. Change VAC dressing 3 X WEEK. Change canister as indicated when full. Nurse may titrate settings and frequency of dressing changes as clinically indicated. - HHRN to order NPWT for patient and initiate once it is available Home Health Nurse may d/c VAC for s/s of increased infection, significant wound regression, or uncontrolled drainage. Notify [facility] at [phone]. Apply contact layer over base of wound. - apply collagen with silver to wound base under foam Minjares, Malaiah B. ([id number]) #1 we'll continue the wound VAC with collagen under the foam. She really dramatic improvement. #2 continue Aquacel Ag to the area over her right ishial tuberosity. Electronic Signature(s) Signed: 11/20/2015 5:01:27 PM By: Baltazar Najjar MD Entered By: Baltazar Najjar on 11/20/2015 16:34:03 Mwangi, [name] ([id number]) -------------------------------------------------------------------------------- SuperBill Details Patient Name: Mayabb, [name] Date of Service: 11/20/2015 Medical Record Patient Account Number: [id number] Number: Treating RN: [name], RN, [name], [name] [birth year] [id number] y.o. Other Clinician: Date of  Birth/Sex: Female) Treating ROBSON, [name] Primary Care Physician/Extender: [name], [name] Physician: Weeks in Treatment: 5 Referring Physician: [facility], [name] Diagnosis Coding ICD-10 Codes Code Description L89.154 Pressure ulcer of sacral region, stage 4 L03.317 Cellulitis of buttock E43 Unspecified severe protein-calorie malnutrition L89.213 Pressure ulcer of right hip, stage 3 Facility Procedures CPT4 Code: 28413244 Description: 99214 - WOUND CARE VISIT-LEV 4 EST PT Modifier: Quantity: 1 Physician Procedures CPT4 Code: [id number] Description: 99213 - WC PHYS LEVEL 3 - EST PT ICD-10 Description Diagnosis L89.154 Pressure ulcer of sacral region, stage 4 Modifier: Quantity: 1 Electronic Signature(s) Signed: 11/20/2015 5:01:27 PM By: Baltazar Najjar MD Entered By: Baltazar Najjar on 11/20/2015 16:35:36

## 2015-11-22 DIAGNOSIS — R000 Tachycardia, unspecified: Secondary | ICD-10-CM | POA: Diagnosis not present

## 2015-11-22 DIAGNOSIS — L89150 Pressure ulcer of sacral region, unstageable: Secondary | ICD-10-CM | POA: Diagnosis not present

## 2015-11-22 DIAGNOSIS — L89620 Pressure ulcer of left heel, unstageable: Secondary | ICD-10-CM | POA: Diagnosis not present

## 2015-11-22 DIAGNOSIS — I10 Essential (primary) hypertension: Secondary | ICD-10-CM | POA: Diagnosis not present

## 2015-11-22 DIAGNOSIS — L89210 Pressure ulcer of right hip, unstageable: Secondary | ICD-10-CM | POA: Diagnosis not present

## 2015-11-22 DIAGNOSIS — G309 Alzheimer's disease, unspecified: Secondary | ICD-10-CM | POA: Diagnosis not present

## 2015-11-22 DIAGNOSIS — E785 Hyperlipidemia, unspecified: Secondary | ICD-10-CM | POA: Diagnosis not present

## 2015-11-25 DIAGNOSIS — G309 Alzheimer's disease, unspecified: Secondary | ICD-10-CM | POA: Diagnosis not present

## 2015-11-25 DIAGNOSIS — L89620 Pressure ulcer of left heel, unstageable: Secondary | ICD-10-CM | POA: Diagnosis not present

## 2015-11-25 DIAGNOSIS — R000 Tachycardia, unspecified: Secondary | ICD-10-CM | POA: Diagnosis not present

## 2015-11-25 DIAGNOSIS — L89150 Pressure ulcer of sacral region, unstageable: Secondary | ICD-10-CM | POA: Diagnosis not present

## 2015-11-25 DIAGNOSIS — I10 Essential (primary) hypertension: Secondary | ICD-10-CM | POA: Diagnosis not present

## 2015-11-25 DIAGNOSIS — L89210 Pressure ulcer of right hip, unstageable: Secondary | ICD-10-CM | POA: Diagnosis not present

## 2015-11-25 DIAGNOSIS — E785 Hyperlipidemia, unspecified: Secondary | ICD-10-CM | POA: Diagnosis not present

## 2015-11-27 ENCOUNTER — Ambulatory Visit: Payer: Medicare Other | Admitting: Internal Medicine

## 2015-11-27 DIAGNOSIS — G309 Alzheimer's disease, unspecified: Secondary | ICD-10-CM | POA: Diagnosis not present

## 2015-11-27 DIAGNOSIS — I10 Essential (primary) hypertension: Secondary | ICD-10-CM | POA: Diagnosis not present

## 2015-11-27 DIAGNOSIS — L89150 Pressure ulcer of sacral region, unstageable: Secondary | ICD-10-CM | POA: Diagnosis not present

## 2015-11-27 DIAGNOSIS — L89210 Pressure ulcer of right hip, unstageable: Secondary | ICD-10-CM | POA: Diagnosis not present

## 2015-11-27 DIAGNOSIS — R000 Tachycardia, unspecified: Secondary | ICD-10-CM | POA: Diagnosis not present

## 2015-11-27 DIAGNOSIS — L89620 Pressure ulcer of left heel, unstageable: Secondary | ICD-10-CM | POA: Diagnosis not present

## 2015-11-27 DIAGNOSIS — E785 Hyperlipidemia, unspecified: Secondary | ICD-10-CM | POA: Diagnosis not present

## 2015-11-29 DIAGNOSIS — L89150 Pressure ulcer of sacral region, unstageable: Secondary | ICD-10-CM | POA: Diagnosis not present

## 2015-11-29 DIAGNOSIS — L89620 Pressure ulcer of left heel, unstageable: Secondary | ICD-10-CM | POA: Diagnosis not present

## 2015-11-29 DIAGNOSIS — G309 Alzheimer's disease, unspecified: Secondary | ICD-10-CM | POA: Diagnosis not present

## 2015-11-29 DIAGNOSIS — L89210 Pressure ulcer of right hip, unstageable: Secondary | ICD-10-CM | POA: Diagnosis not present

## 2015-11-29 DIAGNOSIS — I10 Essential (primary) hypertension: Secondary | ICD-10-CM | POA: Diagnosis not present

## 2015-11-29 DIAGNOSIS — E785 Hyperlipidemia, unspecified: Secondary | ICD-10-CM | POA: Diagnosis not present

## 2015-11-29 DIAGNOSIS — F0280 Dementia in other diseases classified elsewhere without behavioral disturbance: Secondary | ICD-10-CM | POA: Diagnosis not present

## 2015-11-29 DIAGNOSIS — R000 Tachycardia, unspecified: Secondary | ICD-10-CM | POA: Diagnosis not present

## 2015-12-02 DIAGNOSIS — L89620 Pressure ulcer of left heel, unstageable: Secondary | ICD-10-CM | POA: Diagnosis not present

## 2015-12-02 DIAGNOSIS — L89150 Pressure ulcer of sacral region, unstageable: Secondary | ICD-10-CM | POA: Diagnosis not present

## 2015-12-02 DIAGNOSIS — R000 Tachycardia, unspecified: Secondary | ICD-10-CM | POA: Diagnosis not present

## 2015-12-02 DIAGNOSIS — G309 Alzheimer's disease, unspecified: Secondary | ICD-10-CM | POA: Diagnosis not present

## 2015-12-02 DIAGNOSIS — I10 Essential (primary) hypertension: Secondary | ICD-10-CM | POA: Diagnosis not present

## 2015-12-02 DIAGNOSIS — L89210 Pressure ulcer of right hip, unstageable: Secondary | ICD-10-CM | POA: Diagnosis not present

## 2015-12-02 DIAGNOSIS — E785 Hyperlipidemia, unspecified: Secondary | ICD-10-CM | POA: Diagnosis not present

## 2015-12-04 ENCOUNTER — Encounter: Payer: Medicare Other | Attending: Internal Medicine | Admitting: Internal Medicine

## 2015-12-04 DIAGNOSIS — L89213 Pressure ulcer of right hip, stage 3: Secondary | ICD-10-CM | POA: Diagnosis not present

## 2015-12-04 DIAGNOSIS — L89154 Pressure ulcer of sacral region, stage 4: Secondary | ICD-10-CM | POA: Diagnosis not present

## 2015-12-04 DIAGNOSIS — D649 Anemia, unspecified: Secondary | ICD-10-CM | POA: Insufficient documentation

## 2015-12-04 DIAGNOSIS — Z880 Allergy status to penicillin: Secondary | ICD-10-CM | POA: Diagnosis not present

## 2015-12-04 DIAGNOSIS — F0280 Dementia in other diseases classified elsewhere without behavioral disturbance: Secondary | ICD-10-CM | POA: Diagnosis not present

## 2015-12-04 DIAGNOSIS — I10 Essential (primary) hypertension: Secondary | ICD-10-CM | POA: Diagnosis not present

## 2015-12-04 DIAGNOSIS — Z87891 Personal history of nicotine dependence: Secondary | ICD-10-CM | POA: Diagnosis not present

## 2015-12-04 DIAGNOSIS — L89212 Pressure ulcer of right hip, stage 2: Secondary | ICD-10-CM | POA: Diagnosis not present

## 2015-12-04 DIAGNOSIS — E43 Unspecified severe protein-calorie malnutrition: Secondary | ICD-10-CM | POA: Diagnosis not present

## 2015-12-04 DIAGNOSIS — M1990 Unspecified osteoarthritis, unspecified site: Secondary | ICD-10-CM | POA: Diagnosis not present

## 2015-12-04 DIAGNOSIS — L03317 Cellulitis of buttock: Secondary | ICD-10-CM | POA: Diagnosis not present

## 2015-12-04 DIAGNOSIS — I252 Old myocardial infarction: Secondary | ICD-10-CM | POA: Insufficient documentation

## 2015-12-04 DIAGNOSIS — G309 Alzheimer's disease, unspecified: Secondary | ICD-10-CM | POA: Diagnosis not present

## 2015-12-05 NOTE — Progress Notes (Signed)
[name], [name] ([id number]) Visit Report for 12/04/2015 Chief Complaint Document Details Patient Name: [name], [name]. Date of Service: 12/04/2015 3:45 PM Medical Record Patient Account Number: [id number] Number: Treating RN: [name], RN, [name], [name] 15-Feb-1936 [phone] y.o. Other Clinician: Date of Birth/Sex: Female) Treating Annalysse Shoemaker Primary Care Physician/Extender: [name], [name] Physician: Referring Physician: [name] Weeks in Treatment: 7 Information Obtained from: Patient Chief Complaint The patient is here for a sacral decubitus ulcer also a more superficial wound over her right hip Electronic Signature(s) Signed: 12/05/2015 1:24:33 PM By: Baltazar Najjar MD Entered By: Baltazar Najjar on 12/04/2015 16:22:15 Vaccarella, [name] ([id number]) -------------------------------------------------------------------------------- HPI Details Patient Name: [name], [name]. Date of Service: 12/04/2015 3:45 PM Medical Record Patient Account Number: [id number] Number: Treating RN: [name], RN, [name], [name] 21-Oct-1935 [phone] y.o. Other Clinician: Date of Birth/Sex: Female) Treating Horald Birky Primary Care Physician/Extender: [name], [name] Physician: Referring Physician: [name] Weeks in Treatment: 7 History of Present Illness HPI Description: 10/15/15; this is a very frail lady with advanced Alzheimer's disease who is being cared for aerobically at home by her daughter and granddaughter. She is essentially nonambulatory and seems to be minimally verbal. They tell me that she started to develop a pressure area in her sacral area in January. Initially a very small open area that progressed up. There is also a more recent area on the right hip. The patient was seen in her primary office for this problem on 2/23 at that point noted to have a stage III wound with tunneling over the sacrum. There were 2 other areas noted on the upper left and right  bilateral occipital. As well as a stage II on the right trochanter. She was referred to the ER because of tachycardia. She was hospitalized from 2/23 through 2/25. The admission this seems to open dominated by a distal fecal impaction. She was noted to have sinus tachycardia which improved. She did have a CT scan of the abdomen and pelvis during the hospitalization. In addition to the fecal impaction this noted the ulcer in the left lower gluteal region adjacent to the midline. There was air in the subcutaneous wound but no evidence of an air-fluid level to suggest an abscess. There was no comment about osteomyelitis. Since her return home she has not been eating well but is drinking. The family provides total care. I note that her albumin was 3 on admission to the hospital. Sodium got as high as 148 but was normalized by the time she left 10/24/15; this is a patient who has advanced dementia. She has a deep probing stage IV pressure ulcer over her lower sacral area. Cultures of this last week grew both Proteus and methicillin-resistant staph aureus. I and empirically put her on Cipro and doxycycline which should cover both of these. He also has a wound over her right greater trochanter. She is been followed by home health at home they're requesting lab work which I think is reasonable. I ordered a CMP, CBC with differential, sedimentation rate and a C-reactive protein 10/30/15 the patient's labs were done by home health but I think they faxed the orders to the primary physician's office. The plain x-ray I did did not show osteomyelitis however it did show fractures of the right superior and inferior pubic rami which are still not fully healed. I informed the patient's family of this. They do give a history of a fall while she was at an assisted living in [place] in November afterward  she had some trouble walking this may have been the issue. 11/13/15 the wound VAC only started 6 days ago. She is  apparently eating well 11/20/15; the patient continues with the wound VAC without any difficulties. This is being changed by home health. She is apparently eating well 12/04/15; the patient continues with a wound VAC without any difficulties. They're using collagen under the foam being changed by [facility]. The area over the right greater trochanter is also continued to improve she is eating well and undergoing therapeutic positioning by her granddaughter with regularity Electronic Signature(s) Zoll, MANREET KIERNAN ([id number]) Signed: 12/05/2015 1:24:33 PM By: Baltazar Najjar MD Entered By: Baltazar Najjar on 12/04/2015 16:25:43 Milosevic, [name] ([id number]) -------------------------------------------------------------------------------- Physical Exam Details Patient Name: [name], [name]. Date of Service: 12/04/2015 3:45 PM Medical Record Patient Account Number: [id number] Number: Treating RN: [name], RN, [name], [name] 1936-04-06 [phone] y.o. Other Clinician: Date of Birth/Sex: Female) Treating Derk Doubek Primary Care Physician/Extender: [name], [name] Physician: Referring Physician: [name] Weeks in Treatment: 7 Constitutional Patient is hypertensive.. Pulse regular and within target range for patient.Marland Kitchen Respirations regular, non-labored and within target range.. Temperature is normal and within the target range for the patient.. Patient's appearance is neat and clean. Appears in no acute distress. Well nourished and well developed.Marland Kitchen Respiratory Respiratory effort is easy and symmetric bilaterally. Rate is normal at rest and on room air.. Bilateral breath sounds are clear and equal in all lobes with no wheezes, rales or rhonchi.. Cardiovascular Heart rhythm and rate regular, without murmur or gallop.. Gastrointestinal (GI) Abdomen is soft and non-distended without masses or tenderness. Bowel sounds active in all quadrants.. No liver or spleen enlargement or  tenderness.Marland Kitchen Psychiatric Advanced dementia. Notes Wound exam; the area over the right greater trochanter has contracted nicely. The stage for wound has now no palpable bone in the lower sacral area. Granulation tissue continues to be healthy. She has now had the wound VAC on for 4 weeks Electronic Signature(s) Signed: 12/05/2015 1:24:33 PM By: Baltazar Najjar MD Entered By: Baltazar Najjar on 12/04/2015 16:29:01 Comacho, [name] ([id number]) -------------------------------------------------------------------------------- Physician Orders Details Patient Name: [name], [name] [name]. Date of Service: 12/04/2015 3:45 PM Medical Record Patient Account Number: [id number] Number: Treating RN: Huel Coventry 02/23/1936 (79 y.o. Other Clinician: Date of Birth/Sex: Female) Treating Asalee Barrette Primary Care Physician/Extender: [name], [name] Physician: Referring Physician: [name] Weeks in Treatment: 7 Verbal / Phone Orders: Yes Clinician: Huel Coventry Read Back and Verified: Yes Diagnosis Coding Wound Cleansing Wound #1 Sacrum o Clean wound with Normal Saline. Wound #2 Right Ischial Tuberosity o Clean wound with Normal Saline. Anesthetic Wound #1 Sacrum o Topical Lidocaine 4% cream applied to wound bed prior to debridement Wound #2 Right Ischial Tuberosity o Topical Lidocaine 4% cream applied to wound bed prior to debridement Skin Barriers/Peri-Wound Care Wound #1 Sacrum o Skin Prep Wound #2 Right Ischial Tuberosity o Skin Prep Primary Wound Dressing Wound #1 Sacrum o Prisma Ag - use collagen with silver under foam for NPWT Wound #2 Right Ischial Tuberosity o Prisma Ag - use collagen with silver under foam for NPWT Secondary Dressing Wound #2 Right Ischial Tuberosity o Boardered Foam Dressing Dressing Change Frequency [name], [name]. ([id number]) Wound #1 Sacrum o Change Dressing Monday, Wednesday, Friday Wound #2 Right Ischial  Tuberosity o Change dressing every other day. - may change daily as needed for excess drainage o Change Dressing Monday, Wednesday, Friday Follow-up Appointments Wound #1 Sacrum o Return Appointment in 2  weeks. Wound #2 Right Ischial Tuberosity o Return Appointment in 2 weeks. Off-Loading Wound #1 Sacrum o Roho cushion for wheelchair - To be ordered by Home health o Turn and reposition every 2 hours o Mattress [facility] bed ordered by [facility] Wound #2 Right Ischial Tuberosity o Roho cushion for wheelchair - To be ordered by Home health o Turn and reposition every 2 hours o Mattress [facility] bed ordered by [facility] Additional Orders / Instructions Wound #1 Sacrum o Increase protein intake. o Other: - Supplements: Vitamin C, MVI, Zinc Wound #2 Right Ischial Tuberosity o Increase protein intake. o Other: - Supplements: Vitamin C, MVI, Zinc Home Health Wound #1 Sacrum o Continue Home Health Visits - [facility] o Home Health Nurse may visit PRN to address patientos wound care needs. o FACE TO FACE ENCOUNTER: MEDICARE and MEDICAID PATIENTS: I certify that this patient is under my care and that I had a face-to-face encounter that meets the physician face-to-face encounter requirements with this patient on this date. The encounter with the patient was in whole or in part for the following MEDICAL CONDITION: (primary reason for Home Healthcare) MEDICAL NECESSITY: I certify, that based on my findings, NURSING services are a medically necessary home health service. HOME BOUND STATUS: I certify that my clinical findings support that this patient is homebound (i.e., Due to illness or injury, pt requires aid of supportive devices such as crutches, cane, wheelchairs, walkers, the use of special transportation or the assistance of another person to leave their place of residence. There is a normal inability to leave the home  and doing so requires considerable and taxing effort. Other [name], [name] ([id number]) absences are for medical reasons / religious services and are infrequent or of short duration when for other reasons). o If current dressing causes regression in wound condition, may D/C ordered dressing product/s and apply Normal Saline Moist Dressing daily until next Wound Healing Center / Other MD appointment. Notify Wound Healing Center of regression in wound condition at [phone]. o Please direct any NON-WOUND related issues/requests for orders to patient's Primary Care Physician Wound #2 Right Ischial Tuberosity o Continue Home Health Visits - [facility] o Home Health Nurse may visit PRN to address patientos wound care needs. o FACE TO FACE ENCOUNTER: MEDICARE and MEDICAID PATIENTS: I certify that this patient is under my care and that I had a face-to-face encounter that meets the physician face-to-face encounter requirements with this patient on this date. The encounter with the patient was in whole or in part for the following MEDICAL CONDITION: (primary reason for Home Healthcare) MEDICAL NECESSITY: I certify, that based on my findings, NURSING services are a medically necessary home health service. HOME BOUND STATUS: I certify that my clinical findings support that this patient is homebound (i.e., Due to illness or injury, pt requires aid of supportive devices such as crutches, cane, wheelchairs, walkers, the use of special transportation or the assistance of another person to leave their place of residence. There is a normal inability to leave the home and doing so requires considerable and taxing effort. Other absences are for medical reasons / religious services and are infrequent or of short duration when for other reasons). o If current dressing causes regression in wound condition, may D/C ordered dressing product/s and apply Normal Saline Moist Dressing daily  until next Wound Healing Center / Other MD appointment. Notify Wound Healing Center of regression in wound condition at [phone]. o  Please direct any NON-WOUND related issues/requests for orders to patient's Primary Care Physician Negative Pressure Wound Therapy Wound #1 Sacrum o Wound VAC settings at 125/130 mmHg continuous pressure. Use BLACK/GREEN foam to wound cavity. Use WHITE foam to fill any tunnel/s and/or undermining. Change VAC dressing 3 X WEEK. Change canister as indicated when full. Nurse may titrate settings and frequency of dressing changes as clinically indicated. - HHRN to order supplies for NPWT. o Home Health Nurse may d/c VAC for s/s of increased infection, significant wound regression, or uncontrolled drainage. Notify Wound Healing Center at [phone]. o Apply contact layer over base of wound. - apply collagen with silver to wound base under foam Electronic Signature(s) Signed: 12/04/2015 5:54:14 PM By: Elliot GurneyWoody, RN, [name], Kim RN, [name] Signed: 12/05/2015 1:24:33 PM By: Baltazar Najjarobson, Sherlon Nied MD Entered By: Elliot GurneyWoody, RN, [name], Kim on 12/04/2015 16:18:35 [name], [name]. ([id number]) -------------------------------------------------------------------------------- Problem List Details Patient Name: [name], [name]. Date of Service: 12/04/2015 3:45 PM Medical Record Patient Account Number: [id number] Number: Treating RN: Clover MealyAfful, RN, [name], [name] Jul 29, 1936 [phone](79 y.o. Other Clinician: Date of Birth/Sex: Female) Treating Dezeray Puccio Primary Care Physician/Extender: Marlana LatusG SINGH, [name] Physician: Referring Physician: Leotis ShamesSINGH, [name] Weeks in Treatment: 7 Active Problems ICD-10 Encounter Code Description Active Date Diagnosis L89.154 Pressure ulcer of sacral region, stage 4 10/15/2015 Yes L03.317 Cellulitis of buttock 10/15/2015 Yes E43 Unspecified severe protein-calorie malnutrition 10/15/2015 Yes L89.213 Pressure ulcer of right hip, stage 3 10/15/2015 Yes Inactive  Problems Resolved Problems Electronic Signature(s) Signed: 12/05/2015 1:24:33 PM By: Baltazar Najjarobson, Manilla Strieter MD Entered By: Baltazar Najjarobson, Mersedes Alber on 12/04/2015 16:22:03 [name], [name]. ([id number]) -------------------------------------------------------------------------------- Progress Note Details Patient Name: [name], [name]. Date of Service: 12/04/2015 3:45 PM Medical Record Patient Account Number: [id number] Number: Treating RN: Clover MealyAfful, RN, [name], [name] Jul 29, 1936 [phone](79 y.o. Other Clinician: Date of Birth/Sex: Female) Treating Okie Bogacz Primary Care Physician/Extender: Marlana LatusG SINGH, [name] Physician: Referring Physician: Leotis ShamesSINGH, [name] Weeks in Treatment: 7 Subjective Chief Complaint Information obtained from Patient The patient is here for a sacral decubitus ulcer also a more superficial wound over her right hip History of Present Illness (HPI) 10/15/15; this is a very frail lady with advanced Alzheimer's disease who is being cared for aerobically at home by her daughter and granddaughter. She is essentially nonambulatory and seems to be minimally verbal. They tell me that she started to develop a pressure area in her sacral area in January. Initially a very small open area that progressed up. There is also a more recent area on the right hip. The patient was seen in her primary office for this problem on 2/23 at that point noted to have a stage III wound with tunneling over the sacrum. There were 2 other areas noted on the upper left and right bilateral occipital. As well as a stage II on the right trochanter. She was referred to the ER because of tachycardia. She was hospitalized from 2/23 through 2/25. The admission this seems to open dominated by a distal fecal impaction. She was noted to have sinus tachycardia which improved. She did have a CT scan of the abdomen and pelvis during the hospitalization. In addition to the fecal impaction this noted the ulcer in the left lower  gluteal region adjacent to the midline. There was air in the subcutaneous wound but no evidence of an air-fluid level to suggest an abscess. There was no comment about osteomyelitis. Since her return home she has not been eating well but is drinking. The family provides total care. I note  that her albumin was 3 on admission to the hospital. Sodium got as high as 148 but was normalized by the time she left 10/24/15; this is a patient who has advanced dementia. She has a deep probing stage IV pressure ulcer over her lower sacral area. Cultures of this last week grew both Proteus and methicillin-resistant staph aureus. I and empirically put her on Cipro and doxycycline which should cover both of these. He also has a wound over her right greater trochanter. She is been followed by home health at home they're requesting lab work which I think is reasonable. I ordered a CMP, CBC with differential, sedimentation rate and a C-reactive protein 10/30/15 the patient's labs were done by home health but I think they faxed the orders to the primary physician's office. The plain x-ray I did did not show osteomyelitis however it did show fractures of the right superior and inferior pubic rami which are still not fully healed. I informed the patient's family of this. They do give a history of a fall while she was at an assisted living in [place] in November afterward she had some trouble walking this may have been the issue. 11/13/15 the wound VAC only started 6 days ago. She is apparently eating well 11/20/15; the patient continues with the wound VAC without any difficulties. This is being changed by home health. She is apparently eating well Reza, Lynasia [name]. ([id number]) 12/04/15; the patient continues with a wound VAC without any difficulties. They're using collagen under the foam being changed by [facility]. The area over the right greater trochanter is also continued to improve she is eating well and  undergoing therapeutic positioning by her granddaughter with regularity Objective Constitutional Vitals Time Taken: 4:10 PM, Height: 63 in, Temperature: 98.2 F, Pulse: 89 bpm, Respiratory Rate: 16 breaths/min, Blood Pressure: 131/63 mmHg. Integumentary (Hair, Skin) Wound #1 status is Open. Original cause of wound was Gradually Appeared. The wound is located on the Sacrum. The wound measures 3cm length x 2.5cm width x 2cm depth; 5.89cm^2 area and 11.781cm^3 volume. The wound is limited to skin breakdown. There is no tunneling or undermining noted. There is a large amount of serosanguineous drainage noted. The wound margin is thickened. There is large (67- 100%) red granulation within the wound bed. There is no necrotic tissue within the wound bed. The periwound skin appearance exhibited: Moist. The periwound skin appearance did not exhibit: Callus, Crepitus, Excoriation, Fluctuance, Friable, Induration, Localized Edema, Rash, Scarring, Dry/Scaly, Maceration, Atrophie Blanche, Cyanosis, Ecchymosis, Hemosiderin Staining, Mottled, Pallor, Rubor, Erythema. Periwound temperature was noted as No Abnormality. The periwound has tenderness on palpation. Wound #2 status is Open. Original cause of wound was Gradually Appeared. The wound is located on the Right Ischial Tuberosity. The wound measures 0.5cm length x 0.5cm width x 0.1cm depth; 0.196cm^2 area and 0.02cm^3 volume. The wound is limited to skin breakdown. There is a medium amount of serosanguineous drainage noted. The wound margin is distinct with the outline attached to the wound base. There is large (67-100%) pink granulation within the wound bed. There is no necrotic tissue within the wound bed. The periwound skin appearance exhibited: Moist. The periwound skin appearance did not exhibit: Callus, Crepitus, Excoriation, Fluctuance, Friable, Induration, Localized Edema, Rash, Scarring, Dry/Scaly, Maceration, Atrophie Blanche, Cyanosis,  Ecchymosis, Hemosiderin Staining, Mottled, Pallor, Rubor, Erythema. Periwound temperature was noted as No Abnormality. The periwound has tenderness on palpation. Assessment Active Problems ICD-10 L89.154 - Pressure ulcer of sacral region, stage 4 L03.317 - Cellulitis  of buttock E43 - Unspecified severe protein-calorie malnutrition Allshouse, Kymiah [name]. ([id number]) W09.811 - Pressure ulcer of right hip, stage 3 Plan Wound Cleansing: Wound #1 Sacrum: Clean wound with Normal Saline. Wound #2 Right Ischial Tuberosity: Clean wound with Normal Saline. Anesthetic: Wound #1 Sacrum: Topical Lidocaine 4% cream applied to wound bed prior to debridement Wound #2 Right Ischial Tuberosity: Topical Lidocaine 4% cream applied to wound bed prior to debridement Skin Barriers/Peri-Wound Care: Wound #1 Sacrum: Skin Prep Wound #2 Right Ischial Tuberosity: Skin Prep Primary Wound Dressing: Wound #1 Sacrum: Prisma Ag - use collagen with silver under foam for NPWT Wound #2 Right Ischial Tuberosity: Prisma Ag - use collagen with silver under foam for NPWT Secondary Dressing: Wound #2 Right Ischial Tuberosity: Boardered Foam Dressing Dressing Change Frequency: Wound #1 Sacrum: Change Dressing Monday, Wednesday, Friday Wound #2 Right Ischial Tuberosity: Change dressing every other day. - may change daily as needed for excess drainage Change Dressing Monday, Wednesday, Friday Follow-up Appointments: Wound #1 Sacrum: Return Appointment in 2 weeks. Wound #2 Right Ischial Tuberosity: Return Appointment in 2 weeks. Off-Loading: Wound #1 Sacrum: Roho cushion for wheelchair - To be ordered by Home health Turn and reposition every 2 hours Mattress [facility] bed ordered by [facility] Wound #2 Right Ischial Tuberosity: Roho cushion for wheelchair - To be ordered by Home health Sowder, [name] ([id number]) Turn and reposition every 2 hours Mattress [facility] bed ordered by [facility] Additional Orders / Instructions: Wound #1 Sacrum: Increase protein intake. Other: - Supplements: Vitamin C, MVI, Zinc Wound #2 Right Ischial Tuberosity: Increase protein intake. Other: - Supplements: Vitamin C, MVI, Zinc Home Health: Wound #1 Sacrum: Continue Home Health Visits - [facility] Home Health Nurse may visit PRN to address patient s wound care needs. FACE TO FACE ENCOUNTER: MEDICARE and MEDICAID PATIENTS: I certify that this patient is under my care and that I had a face-to-face encounter that meets the physician face-to-face encounter requirements with this patient on this date. The encounter with the patient was in whole or in part for the following MEDICAL CONDITION: (primary reason for Home Healthcare) MEDICAL NECESSITY: I certify, that based on my findings, NURSING services are a medically necessary home health service. HOME BOUND STATUS: I certify that my clinical findings support that this patient is homebound (i.e., Due to illness or injury, pt requires aid of supportive devices such as crutches, cane, wheelchairs, walkers, the use of special transportation or the assistance of another person to leave their place of residence. There is a normal inability to leave the home and doing so requires considerable and taxing effort. Other absences are for medical reasons / religious services and are infrequent or of short duration when for other reasons). If current dressing causes regression in wound condition, may D/C ordered dressing product/s and apply Normal Saline Moist Dressing daily until next Wound Healing Center / Other MD appointment. Notify Wound Healing Center of regression in wound condition at [phone]. Please direct any NON-WOUND related issues/requests for orders to patient's Primary Care Physician Wound #2 Right Ischial Tuberosity: Continue Home Health Visits - [facility] Home Health Nurse may visit PRN to address patient s wound  care needs. FACE TO FACE ENCOUNTER: MEDICARE and MEDICAID PATIENTS: I certify that this patient is under my care and that I had a face-to-face encounter that meets the physician face-to-face encounter requirements with this patient on this date. The encounter with the patient was in whole or in  part for the following MEDICAL CONDITION: (primary reason for Home Healthcare) MEDICAL NECESSITY: I certify, that based on my findings, NURSING services are a medically necessary home health service. HOME BOUND STATUS: I certify that my clinical findings support that this patient is homebound (i.e., Due to illness or injury, pt requires aid of supportive devices such as crutches, cane, wheelchairs, walkers, the use of special transportation or the assistance of another person to leave their place of residence. There is a normal inability to leave the home and doing so requires considerable and taxing effort. Other absences are for medical reasons / religious services and are infrequent or of short duration when for other reasons). If current dressing causes regression in wound condition, may D/C ordered dressing product/s and apply Normal Saline Moist Dressing daily until next Wound Healing Center / Other MD appointment. Notify Wound Healing Center of regression in wound condition at [phone]. Please direct any NON-WOUND related issues/requests for orders to patient's Primary Care Physician Negative Pressure Wound Therapy: Wound #1 Sacrum: Wound VAC settings at 125/130 mmHg continuous pressure. Use BLACK/GREEN foam to wound cavity. Use WHITE foam to fill any tunnel/s and/or undermining. Change VAC dressing 3 X WEEK. Change canister as indicated when full. Nurse may titrate settings and frequency of dressing changes as clinically indicated. - HHRN to order supplies for NPWT. Home Health Nurse may d/c VAC for s/s of increased infection, significant wound regression, or Verdell, Trenyce [name].  ([id number]) uncontrolled drainage. Notify Wound Healing Center at [phone]. Apply contact layer over base of wound. - apply collagen with silver to wound base under foam #1 we will continue with collagen under the wound VAC #2 continue with Prisma over the right greater trochanter Electronic Signature(s) Signed: 12/05/2015 1:24:33 PM By: Baltazar Najjar MD Entered By: Baltazar Najjar on 12/04/2015 16:28:04 Capriotti, [name] ([id number]) -------------------------------------------------------------------------------- SuperBill Details Patient Name: Mathieu, [name] [name]. Date of Service: 12/04/2015 Medical Record Patient Account Number: [id number] Number: Treating RN: [name], RN, [name], [name] 1935-12-17 [phone] y.o. Other Clinician: Date of Birth/Sex: Female) Treating Algernon Mundie Primary Care Physician/Extender: [name], [name] Physician: Weeks in Treatment: 7 Referring Physician: [facility], [name] Diagnosis Coding ICD-10 Codes Code Description L89.154 Pressure ulcer of sacral region, stage 4 L03.317 Cellulitis of buttock E43 Unspecified severe protein-calorie malnutrition L89.213 Pressure ulcer of right hip, stage 3 Facility Procedures CPT4 Code: 10272536 Description: 99214 - WOUND CARE VISIT-LEV 4 EST PT Modifier: Quantity: 1 Physician Procedures CPT4 Code: [id number] Description: 99213 - WC PHYS LEVEL 3 - EST PT ICD-10 Description Diagnosis L89.154 Pressure ulcer of sacral region, stage 4 Modifier: Quantity: 1 Electronic Signature(s) Signed: 12/05/2015 1:24:33 PM By: Baltazar Najjar MD Entered By: Baltazar Najjar on 12/04/2015 16:29:53

## 2015-12-05 NOTE — Progress Notes (Signed)
Zuidema, JAISHA VILLACRES ([id number]) Visit Report for 12/04/2015 Arrival Information Details Patient Name: Alesi, DANICA B. Date of Service: 12/04/2015 3:45 PM Medical Record Patient Account Number: [id number] Number: Treating RN: [name], RN, [name], [name] 05-07-1936 [phone] y.o. Other Clinician: Date of Birth/Sex: Female) Treating ROBSON, [name] Primary Care Physician: [name] Physician/Extender: G Referring Physician: Thedore Mins, [name] Weeks in Treatment: 7 Visit Information History Since Last Visit Added or deleted any medications: No Patient Arrived: Wheel Chair Any new allergies or adverse reactions: No Arrival Time: 16:05 Had a fall or experienced change in No activities of daily living that may affect Accompanied By: grd dtr risk of falls: Transfer Assistance: None Signs or symptoms of abuse/neglect since last No Patient Identification Verified: Yes visito Secondary Verification Process Yes Hospitalized since last visit: No Completed: Has Dressing in Place as Prescribed: Yes Patient Requires Transmission-Based No Pain Present Now: No Precautions: Patient Has Alerts: No Electronic Signature(s) Signed: 12/04/2015 5:32:09 PM By: Elpidio Eric [name], RN Entered By: Elpidio Eric on 12/04/2015 16:05:29 Chait, [name] ([id number]) -------------------------------------------------------------------------------- Clinic Level of Care Assessment Details Patient Name: Serfass, [name] Date of Service: 12/04/2015 3:45 PM Medical Record Patient Account Number: [id number] Number: Treating RN: Huel Coventry May 25, 1936 (80 y.o. Other Clinician: Date of Birth/Sex: Female) Treating ROBSON, [name] Primary Care Physician: [name] Physician/Extender: G Referring Physician: Thedore Mins, [name] Weeks in Treatment: 7 Clinic Level of Care Assessment Items TOOL 4 Quantity Score []  - Use when only an EandM is performed on FOLLOW-UP visit 0 ASSESSMENTS - Nursing Assessment /  Reassessment X - Reassessment of Co-morbidities (includes updates in patient status) 1 10 X - Reassessment of Adherence to Treatment Plan 1 5 ASSESSMENTS - Wound and Skin Assessment / Reassessment []  - Simple Wound Assessment / Reassessment - one wound 0 X - Complex Wound Assessment / Reassessment - multiple wounds 2 5 []  - Dermatologic / Skin Assessment (not related to wound area) 0 ASSESSMENTS - Focused Assessment []  - Circumferential Edema Measurements - multi extremities 0 []  - Nutritional Assessment / Counseling / Intervention 0 []  - Lower Extremity Assessment (monofilament, tuning fork, pulses) 0 []  - Peripheral Arterial Disease Assessment (using hand held doppler) 0 ASSESSMENTS - Ostomy and/or Continence Assessment and Care []  - Incontinence Assessment and Management 0 []  - Ostomy Care Assessment and Management (repouching, etc.) 0 PROCESS - Coordination of Care []  - Simple Patient / Family Education for ongoing care 0 X - Complex (extensive) Patient / Family Education for ongoing care 1 20 X - Staff obtains Chiropractor, Records, Test Results / Process Orders 1 10 []  - Staff telephones HHA, Nursing Homes / Clarify orders / etc 0 Dettloff, Eupha B. ([id number]) []  - Routine Transfer to another Facility (non-emergent condition) 0 []  - Routine Hospital Admission (non-emergent condition) 0 []  - New Admissions / Manufacturing engineer / Ordering NPWT, Apligraf, etc. 0 []  - Emergency Hospital Admission (emergent condition) 0 []  - Simple Discharge Coordination 0 X - Complex (extensive) Discharge Coordination 1 15 PROCESS - Special Needs []  - Pediatric / Minor Patient Management 0 []  - Isolation Patient Management 0 []  - Hearing / Language / Visual special needs 0 []  - Assessment of Community assistance (transportation, D/C planning, etc.) 0 []  - Additional assistance / Altered mentation 0 []  - Support Surface(s) Assessment (bed, cushion, seat, etc.) 0 INTERVENTIONS - Wound Cleansing /  Measurement []  - Simple Wound Cleansing - one wound 0 X - Complex Wound Cleansing - multiple wounds 2 5 X - Wound Imaging (photographs -  any number of wounds) 1 5  - Wound Tracing (instead of photographs) 0  - Simple Wound Measurement - one wound 0 X - Complex Wound Measurement - multiple wounds 2 5 INTERVENTIONS - Wound Dressings X - Small Wound Dressing one or multiple wounds 1 10  - Medium Wound Dressing one or multiple wounds 0 X - Large Wound Dressing one or multiple wounds 1 20  - Application of Medications - topical 0  - Application of Medications - injection 0 Harari, Jayce B. ([id number]) INTERVENTIONS - Miscellaneous  - External ear exam 0  - Specimen Collection (cultures, biopsies, blood, body fluids, etc.) 0  - Specimen(s) / Culture(s) sent or taken to Lab for analysis 0  - Patient Transfer (multiple staff / Michiel Sites Lift / Similar devices) 0  - Simple Staple / Suture removal (25 or less) 0  - Complex Staple / Suture removal (26 or more) 0  - Hypo / Hyperglycemic Management (close monitor of Blood Glucose) 0  - Ankle / Brachial Index (ABI) - do not check if billed separately 0 X - Vital Signs 1 5 Has the patient been seen at the hospital within the last three years: Yes Total Score: 130 Level Of Care: New/Established - Level 4 Electronic Signature(s) Signed: 12/04/2015 5:54:14 PM By: Elliot Gurney, RN, [name], Kim RN, [name] Entered By: Elliot Gurney, RN, [name], Kim on 12/04/2015 16:21:36 Gattuso, [name] ([id number]) -------------------------------------------------------------------------------- Encounter Discharge Information Details Patient Name: Liskey, [name] Date of Service: 12/04/2015 3:45 PM Medical Record Patient Account Number: [id number] Number: Treating RN: Huel Coventry 12/31/1935 (80 y.o. Other Clinician: Date of Birth/Sex: Female) Treating ROBSON, [name] Primary Care Physician: [name] Physician/Extender: G Referring Physician:  [name] Weeks in Treatment: 7 Encounter Discharge Information Items Discharge Pain Level: 0 Discharge Condition: Stable Ambulatory Status: Wheelchair Discharge Destination: Home Transportation: Private Auto Accompanied By: grandaughter Schedule Follow-up Appointment: Yes Medication Reconciliation completed and provided to Patient/Care Yes Leighann Amadon: Provided on Clinical Summary of Care: 12/04/2015 Form Type Recipient Paper Patient WJ Electronic Signature(s) Signed: 12/04/2015 4:32:03 PM By: Gwenlyn Perking Entered By: Gwenlyn Perking on 12/04/2015 16:32:03 Starrett, [name] ([id number]) -------------------------------------------------------------------------------- Multi Wound Chart Details Patient Name: Feld, [name] Date of Service: 12/04/2015 3:45 PM Medical Record Patient Account Number: [id number] Number: Treating RN: [name], RN, [name], [name] 09-14-1935 [phone] y.o. Other Clinician: Date of Birth/Sex: Female) Treating ROBSON, [name] Primary Care Physician: [name] Physician/Extender: G Referring Physician: Thedore Mins, [name] Weeks in Treatment: 7 Vital Signs Height(in): 63 Pulse(bpm): 89 Weight(lbs): Blood Pressure 131/63 (mmHg): Body Mass Index(BMI): Temperature(F): 98.2 Respiratory Rate 16 (breaths/min): Photos: [1:No Photos] [2:No Photos] [N/A:N/A] Wound Location: [1:Sacrum] [2:Right Ischial Tuberosity N/A] Wounding Event: [1:Gradually Appeared] [2:Gradually Appeared] [N/A:N/A] Primary Etiology: [1:Pressure Ulcer] [2:Pressure Ulcer] [N/A:N/A] Comorbid History: [1:Cataracts, Anemia, Arrhythmia, Hypertension, Arrhythmia, Hypertension, Myocardial Infarction, History of pressure wounds, Osteoarthritis, Dementia] [2:Cataracts, Anemia, Myocardial Infarction, History of pressure wounds,  Osteoarthritis, Dementia] [N/A:N/A] Date Acquired: [1:09/10/2015] [2:09/10/2015] [N/A:N/A] Weeks of Treatment: [1:7] [2:7] [N/A:N/A] Wound Status: [1:Open] [2:Open]  [N/A:N/A] Measurements L x W x D 3x2.5x2 [2:0.5x0.5x0.1] [N/A:N/A] (cm) Area (cm) : [1:5.89] [2:0.196] [N/A:N/A] Volume (cm) : [1:11.781] [2:0.02] [N/A:N/A] % Reduction in Area: [1:60.90%] [2:75.00%] [N/A:N/A] % Reduction in Volume: 82.60% [2:74.70%] [N/A:N/A] Classification: [1:Category/Stage IV] [2:Category/Stage II] [N/A:N/A] Exudate Amount: [1:Large] [2:Medium] [N/A:N/A] Exudate Type: [1:Serosanguineous] [2:Serosanguineous] [N/A:N/A] Exudate Color: [1:red, brown] [2:red, brown] [N/A:N/A] Foul Odor After [1:Yes] [2:No] [N/A:N/A] Cleansing: Odor Anticipated Due to No [2:N/A] [N/A:N/A] Product Use: Wound Margin: [1:Thickened] [2:Distinct, outline attached N/A] Granulation Amount: Large (67-100%)  Large (67-100%) N/A Granulation Quality: Red Pink N/A Necrotic Amount: None Present (0%) None Present (0%) N/A Exposed Structures: Fascia: No Fascia: No N/A Fat: No Fat: No Tendon: No Tendon: No Muscle: No Muscle: No Joint: No Joint: No Bone: No Bone: No Limited to Skin Limited to Skin Breakdown Breakdown Epithelialization: None None N/A Periwound Skin Texture: Edema: No Edema: No N/A Excoriation: No Excoriation: No Induration: No Induration: No Callus: No Callus: No Crepitus: No Crepitus: No Fluctuance: No Fluctuance: No Friable: No Friable: No Rash: No Rash: No Scarring: No Scarring: No Periwound Skin Moist: Yes Moist: Yes N/A Moisture: Maceration: No Maceration: No Dry/Scaly: No Dry/Scaly: No Periwound Skin Color: Atrophie Blanche: No Atrophie Blanche: No N/A Cyanosis: No Cyanosis: No Ecchymosis: No Ecchymosis: No Erythema: No Erythema: No Hemosiderin Staining: No Hemosiderin Staining: No Mottled: No Mottled: No Pallor: No Pallor: No Rubor: No Rubor: No Temperature: No Abnormality No Abnormality N/A Tenderness on Yes Yes N/A Palpation: Wound Preparation: Ulcer Cleansing: Ulcer Cleansing: N/A Rinsed/Irrigated with Rinsed/Irrigated  with Saline Saline Topical Anesthetic Topical Anesthetic Applied: Other: lidocaine Applied: Other: lidocaine 4% 4% Treatment Notes Electronic Signature(s) Signed: 12/04/2015 5:32:09 PM By: Elpidio Eric [name], RN Entered By: Elpidio Eric on 12/04/2015 16:12:44 Skillern, [name] ([id number]) Skaff, [name] ([id number]) -------------------------------------------------------------------------------- Multi-Disciplinary Care Plan Details Patient Name: Stalling, [name] Date of Service: 12/04/2015 3:45 PM Medical Record Patient Account Number: [id number] Number: Treating RN: [name], RN, [name], [name] 1935-08-28 [phone] y.o. Other Clinician: Date of Birth/Sex: Female) Treating ROBSON, [name] Primary Care Physician: [name] Physician/Extender: G Referring Physician: Thedore Mins, [name] Weeks in Treatment: 7 Active Inactive Abuse / Safety / Falls / Self Care Management Nursing Diagnoses: Impaired home maintenance Impaired physical mobility Knowledge deficit related to: safety; personal, health (wound), emergency Potential for falls Self care deficit: actual or potential Goals: Patient will remain injury free Date Initiated: 10/15/2015 Goal Status: Active Patient/caregiver will verbalize understanding of skin care regimen Date Initiated: 10/15/2015 Goal Status: Active Patient/caregiver will verbalize/demonstrate measure taken to improve self care Date Initiated: 10/15/2015 Goal Status: Active Patient/caregiver will verbalize/demonstrate measures taken to improve the patient's personal safety Date Initiated: 10/15/2015 Goal Status: Active Patient/caregiver will verbalize/demonstrate measures taken to prevent injury and/or falls Date Initiated: 10/15/2015 Goal Status: Active Patient/caregiver will verbalize/demonstrate understanding of what to do in case of emergency Date Initiated: 10/15/2015 Goal Status: Active Interventions: Assess fall risk on admission and as needed Assess:  immobility, friction, shearing, incontinence upon admission and as needed Assess impairment of mobility on admission and as needed per policy Griffiths, Nathifa B. ([id number]) Assess self care needs on admission and as needed Provide education on basic hygiene Provide education on fall prevention Provide education on personal and home safety Provide education on safe transfers Treatment Activities: Education provided on Basic Hygiene : 11/13/2015 Patient referred to home care : 11/20/2015 Notes: Nutrition Nursing Diagnoses: Imbalanced nutrition Impaired glucose control: actual or potential Potential for alteratiion in Nutrition/Potential for imbalanced nutrition Goals: Patient/caregiver agrees to and verbalizes understanding of need to obtain nutritional consultation Date Initiated: 10/15/2015 Goal Status: Active Patient/caregiver agrees to and verbalizes understanding of need to use nutritional supplements and/or vitamins as prescribed Date Initiated: 10/15/2015 Goal Status: Active Patient/caregiver verbalizes understanding of need to maintain therapeutic glucose control per primary care physician Date Initiated: 10/15/2015 Goal Status: Active Interventions: Assess patient nutrition upon admission and as needed per policy Provide education on elevated blood sugars and impact on wound healing Provide education on nutrition Treatment Activities: Education provided on Nutrition :  11/13/2015 Notes: Orientation to the Wound Care Program Nursing Diagnoses: Knowledge deficit related to the wound healing center program Brister, Deer'S Head CenterWILMA B. ([id number]) Goals: Patient/caregiver will verbalize understanding of the Wound Healing Center Program Date Initiated: 10/15/2015 Goal Status: Active Interventions: Provide education on orientation to the wound center Notes: Pressure Nursing Diagnoses: Knowledge deficit related to causes and risk factors for pressure ulcer development Knowledge  deficit related to management of pressures ulcers Potential for impaired tissue integrity related to pressure, friction, moisture, and shear Goals: Patient will remain free from development of additional pressure ulcers Date Initiated: 10/15/2015 Goal Status: Active Patient will remain free of pressure ulcers Date Initiated: 10/15/2015 Goal Status: Active Patient/caregiver will verbalize risk factors for pressure ulcer development Date Initiated: 10/15/2015 Goal Status: Active Patient/caregiver will verbalize understanding of pressure ulcer management Date Initiated: 10/15/2015 Goal Status: Active Interventions: Assess: immobility, friction, shearing, incontinence upon admission and as needed Assess offloading mechanisms upon admission and as needed Assess potential for pressure ulcer upon admission and as needed Provide education on pressure ulcers Treatment Activities: Patient referred for home evaluation of offloading devices/mattresses : 11/20/2015 Patient referred for pressure reduction/relief devices : 11/20/2015 Patient referred for seating evaluation to ensure proper offloading : 11/20/2015 Pressure reduction/relief device ordered : 11/20/2015 Notes: Ivancic, [name] ([id number]) Wound/Skin Impairment Nursing Diagnoses: Impaired tissue integrity Knowledge deficit related to smoking impact on wound healing Knowledge deficit related to ulceration/compromised skin integrity Goals: Patient/caregiver will verbalize understanding of skin care regimen Date Initiated: 10/15/2015 Goal Status: Active Ulcer/skin breakdown will have a volume reduction of 30% by week 4 Date Initiated: 10/15/2015 Goal Status: Active Ulcer/skin breakdown will have a volume reduction of 50% by week 8 Date Initiated: 10/15/2015 Goal Status: Active Ulcer/skin breakdown will have a volume reduction of 80% by week 12 Date Initiated: 10/15/2015 Goal Status: Active Ulcer/skin breakdown will heal within 14  weeks Date Initiated: 10/15/2015 Goal Status: Active Interventions: Assess patient/caregiver ability to obtain necessary supplies Assess patient/caregiver ability to perform ulcer/skin care regimen upon admission and as needed Assess ulceration(s) every visit Provide education on ulcer and skin care Treatment Activities: Patient referred to home care : 11/20/2015 Skin care regimen initiated : 11/20/2015 Notes: Electronic Signature(s) Signed: 12/04/2015 5:32:09 PM By: Elpidio EricAfful, [name], RN Entered By: Elpidio EricAfful, [name] on 12/04/2015 16:12:34 Cislo, [name] ([id number]) -------------------------------------------------------------------------------- Pain Assessment Details Patient Name: Brasher, Marylouise StacksWILMA B. Date of Service: 12/04/2015 3:45 PM Medical Record Patient Account Number: [id number] Number: Treating RN: Clover MealyAfful, RN, [name], [name] [birth year] [phone](79 y.o. Other Clinician: Date of Birth/Sex: Female) Treating ROBSON, [name] Primary Care Physician: Leotis ShamesSINGH, [name] Physician/Extender: G Referring Physician: Thedore MinsSINGH, [name] Weeks in Treatment: 7 Active Problems Location of Pain Severity and Description of Pain Patient Has Paino No Site Locations Pain Management and Medication Current Pain Management: Electronic Signature(s) Signed: 12/04/2015 5:32:09 PM By: Elpidio EricAfful, [name], RN Entered By: Elpidio EricAfful, [name] on 12/04/2015 16:05:36 Haueter, [name] ([id number]) -------------------------------------------------------------------------------- Patient/Caregiver Education Details Patient Name: Woolford, Marylouise StacksWILMA B. Date of Service: 12/04/2015 3:45 PM Medical Record Patient Account Number: [id number] Number: Treating RN: Huel CoventryWoody, Kim [birth year] (79 y.o. Other Clinician: Date of Birth/Gender: Female) Treating ROBSON, [name] Primary Care Physician: Leotis ShamesSINGH, [name] Physician/Extender: G Referring Physician: Leotis ShamesSINGH, [name] Weeks in Treatment: 7 Education Assessment Education Provided  To: Caregiver Education Topics Provided Nutrition: Handouts: Other: take vitamins as prescribed and increase protein Methods: Demonstration, Explain/Verbal Responses: State content correctly Wound/Skin Impairment: Handouts: Caring for Your Ulcer, Other: continue wound care as prescribed Methods: Demonstration, Explain/Verbal Responses: State content correctly Electronic  Signature(s) Signed: 12/04/2015 5:54:14 PM By: Elliot Gurney, RN, [name], Kim RN, [name] Entered By: Elliot Gurney, RN, [name], Kim on 12/04/2015 16:24:44 Liaw, [name] ([id number]) -------------------------------------------------------------------------------- Wound Assessment Details Patient Name: Lalley, Barri B. Date of Service: 12/04/2015 3:45 PM Medical Record Patient Account Number: [id number] Number: Treating RN: [name], RN, [name], [name] 04-16-1936 [phone] y.o. Other Clinician: Date of Birth/Sex: Female) Treating ROBSON, [name] Primary Care Physician: [name] Physician/Extender: G Referring Physician: Thedore Mins, [name] Weeks in Treatment: 7 Wound Status Wound Number: 1 Primary Pressure Ulcer Etiology: Wound Location: Sacrum Wound Open Wounding Event: Gradually Appeared Status: Date Acquired: 09/10/2015 Comorbid Cataracts, Anemia, Arrhythmia, Weeks Of Treatment: 7 History: Hypertension, Myocardial Infarction, Clustered Wound: No History of pressure wounds, Osteoarthritis, Dementia Photos Photo Uploaded By: Elpidio Eric on 12/04/2015 16:42:46 Wound Measurements Length: (cm) 3 Width: (cm) 2.5 Depth: (cm) 2 Area: (cm) 5.89 Volume: (cm) 11.781 % Reduction in Area: 60.9% % Reduction in Volume: 82.6% Epithelialization: None Tunneling: No Undermining: No Wound Description Classification: Category/Stage IV Wound Margin: Thickened Exudate Amount: Large Exudate Type: Serosanguineous Exudate Color: red, brown Foul Odor After Cleansing: Yes Due to Product Use: No Wound Bed Granulation Amount: Large  (67-100%) Exposed Structure Matarese, Cecillia B. ([id number]) Granulation Quality: Red Fascia Exposed: No Necrotic Amount: None Present (0%) Fat Layer Exposed: No Tendon Exposed: No Muscle Exposed: No Joint Exposed: No Bone Exposed: No Limited to Skin Breakdown Periwound Skin Texture Texture Color No Abnormalities Noted: No No Abnormalities Noted: No Callus: No Atrophie Blanche: No Crepitus: No Cyanosis: No Excoriation: No Ecchymosis: No Fluctuance: No Erythema: No Friable: No Hemosiderin Staining: No Induration: No Mottled: No Localized Edema: No Pallor: No Rash: No Rubor: No Scarring: No Temperature / Pain Moisture Temperature: No Abnormality No Abnormalities Noted: No Tenderness on Palpation: Yes Dry / Scaly: No Maceration: No Moist: Yes Wound Preparation Ulcer Cleansing: Rinsed/Irrigated with Saline Topical Anesthetic Applied: Other: lidocaine 4%, Treatment Notes Wound #1 (Sacrum) 1. Cleansed with: Clean wound with Normal Saline 2. Anesthetic Topical Lidocaine 4% cream to wound bed prior to debridement 3. Peri-wound Care: Skin Prep 4. Dressing Applied: Prisma Ag 8. Negative Pressure Wound Therapy Wound Vac to wound continuously at 137mm/hg pressure Black Foam Number of foam/gauze pieces used in the dressing = Notes 2 black foam applied Verga, [name] ([id number]) Electronic Signature(s) Signed: 12/04/2015 5:32:09 PM By: Elpidio Eric [name], RN Entered By: Elpidio Eric on 12/04/2015 16:09:42 Mun, [name] ([id number]) -------------------------------------------------------------------------------- Wound Assessment Details Patient Name: Pauwels, [name] Date of Service: 12/04/2015 3:45 PM Medical Record Patient Account Number: [id number] Number: Treating RN: [name], RN, [name], [name] 05/24/1936 [phone] y.o. Other Clinician: Date of Birth/Sex: Female) Treating ROBSON, [name] Primary Care Physician: [name] Physician/Extender:  G Referring Physician: Thedore Mins, [name] Weeks in Treatment: 7 Wound Status Wound Number: 2 Primary Pressure Ulcer Etiology: Wound Location: Right Ischial Tuberosity Wound Open Wounding Event: Gradually Appeared Status: Date Acquired: 09/10/2015 Comorbid Cataracts, Anemia, Arrhythmia, Weeks Of Treatment: 7 History: Hypertension, Myocardial Infarction, Clustered Wound: No History of pressure wounds, Osteoarthritis, Dementia Photos Photo Uploaded By: Elpidio Eric on 12/04/2015 16:42:46 Wound Measurements Length: (cm) 0.5 Width: (cm) 0.5 Depth: (cm) 0.1 Area: (cm) 0.196 Volume: (cm) 0.02 % Reduction in Area: 75% % Reduction in Volume: 74.7% Epithelialization: None Wound Description Classification: Category/Stage II Wound Margin: Distinct, outline attached Exudate Amount: Medium Exudate Type: Serosanguineous Exudate Color: red, brown Foul Odor After Cleansing: No Wound Bed Granulation Amount: Large (67-100%) Exposed Structure Quirarte, Jennfier B. ([id number]) Granulation Quality: Pink Fascia Exposed: No Necrotic Amount: None  Present (0%) Fat Layer Exposed: No Tendon Exposed: No Muscle Exposed: No Joint Exposed: No Bone Exposed: No Limited to Skin Breakdown Periwound Skin Texture Texture Color No Abnormalities Noted: No No Abnormalities Noted: No Callus: No Atrophie Blanche: No Crepitus: No Cyanosis: No Excoriation: No Ecchymosis: No Fluctuance: No Erythema: No Friable: No Hemosiderin Staining: No Induration: No Mottled: No Localized Edema: No Pallor: No Rash: No Rubor: No Scarring: No Temperature / Pain Moisture Temperature: No Abnormality No Abnormalities Noted: No Tenderness on Palpation: Yes Dry / Scaly: No Maceration: No Moist: Yes Wound Preparation Ulcer Cleansing: Rinsed/Irrigated with Saline Topical Anesthetic Applied: Other: lidocaine 4%, Treatment Notes Wound #2 (Right Ischial Tuberosity) 1. Cleansed with: Clean wound with Normal  Saline 2. Anesthetic Topical Lidocaine 4% cream to wound bed prior to debridement 4. Dressing Applied: Prisma Ag 5. Secondary Dressing Applied Bordered Foam Dressing Electronic Signature(s) Signed: 12/04/2015 5:32:09 PM By: Elpidio Eric [name], RN Entered By: Elpidio Eric on 12/04/2015 16:10:16 Mathias, [name] ([id number]) -------------------------------------------------------------------------------- Vitals Details Patient Name: Mangal, [name] Date of Service: 12/04/2015 3:45 PM Medical Record Patient Account Number: [id number] Number: Treating RN: [name], RN, [name], [name] 01-Jan-1936 [phone] y.o. Other Clinician: Date of Birth/Sex: Female) Treating ROBSON, [name] Primary Care Physician: [name] Physician/Extender: G Referring Physician: Thedore Mins, [name] Weeks in Treatment: 7 Vital Signs Time Taken: 16:10 Temperature (F): 98.2 Height (in): 63 Pulse (bpm): 89 Respiratory Rate (breaths/min): 16 Blood Pressure (mmHg): 131/63 Reference Range: 80 - 120 mg / dl Electronic Signature(s) Signed: 12/04/2015 5:32:09 PM By: Elpidio Eric [name], RN Entered By: Elpidio Eric on 12/04/2015 16:12:20

## 2015-12-06 DIAGNOSIS — F0280 Dementia in other diseases classified elsewhere without behavioral disturbance: Secondary | ICD-10-CM | POA: Diagnosis not present

## 2015-12-06 DIAGNOSIS — R000 Tachycardia, unspecified: Secondary | ICD-10-CM | POA: Diagnosis not present

## 2015-12-06 DIAGNOSIS — L89620 Pressure ulcer of left heel, unstageable: Secondary | ICD-10-CM | POA: Diagnosis not present

## 2015-12-06 DIAGNOSIS — L89210 Pressure ulcer of right hip, unstageable: Secondary | ICD-10-CM | POA: Diagnosis not present

## 2015-12-06 DIAGNOSIS — L89150 Pressure ulcer of sacral region, unstageable: Secondary | ICD-10-CM | POA: Diagnosis not present

## 2015-12-06 DIAGNOSIS — G309 Alzheimer's disease, unspecified: Secondary | ICD-10-CM | POA: Diagnosis not present

## 2015-12-06 DIAGNOSIS — E785 Hyperlipidemia, unspecified: Secondary | ICD-10-CM | POA: Diagnosis not present

## 2015-12-06 DIAGNOSIS — L89154 Pressure ulcer of sacral region, stage 4: Secondary | ICD-10-CM | POA: Diagnosis not present

## 2015-12-06 DIAGNOSIS — I10 Essential (primary) hypertension: Secondary | ICD-10-CM | POA: Diagnosis not present

## 2015-12-06 DIAGNOSIS — G300 Alzheimer's disease with early onset: Secondary | ICD-10-CM | POA: Diagnosis not present

## 2015-12-09 DIAGNOSIS — L89154 Pressure ulcer of sacral region, stage 4: Secondary | ICD-10-CM | POA: Diagnosis not present

## 2015-12-09 DIAGNOSIS — I10 Essential (primary) hypertension: Secondary | ICD-10-CM | POA: Diagnosis not present

## 2015-12-09 DIAGNOSIS — L89159 Pressure ulcer of sacral region, unspecified stage: Secondary | ICD-10-CM | POA: Diagnosis not present

## 2015-12-09 DIAGNOSIS — E785 Hyperlipidemia, unspecified: Secondary | ICD-10-CM | POA: Diagnosis not present

## 2015-12-09 DIAGNOSIS — G300 Alzheimer's disease with early onset: Secondary | ICD-10-CM | POA: Diagnosis not present

## 2015-12-09 DIAGNOSIS — F0280 Dementia in other diseases classified elsewhere without behavioral disturbance: Secondary | ICD-10-CM | POA: Diagnosis not present

## 2015-12-09 DIAGNOSIS — L89150 Pressure ulcer of sacral region, unstageable: Secondary | ICD-10-CM | POA: Diagnosis not present

## 2015-12-09 DIAGNOSIS — T8189XA Other complications of procedures, not elsewhere classified, initial encounter: Secondary | ICD-10-CM | POA: Diagnosis not present

## 2015-12-09 DIAGNOSIS — R000 Tachycardia, unspecified: Secondary | ICD-10-CM | POA: Diagnosis not present

## 2015-12-09 DIAGNOSIS — G309 Alzheimer's disease, unspecified: Secondary | ICD-10-CM | POA: Diagnosis not present

## 2015-12-09 DIAGNOSIS — L89620 Pressure ulcer of left heel, unstageable: Secondary | ICD-10-CM | POA: Diagnosis not present

## 2015-12-09 DIAGNOSIS — L89210 Pressure ulcer of right hip, unstageable: Secondary | ICD-10-CM | POA: Diagnosis not present

## 2015-12-11 DIAGNOSIS — L89150 Pressure ulcer of sacral region, unstageable: Secondary | ICD-10-CM | POA: Diagnosis not present

## 2015-12-11 DIAGNOSIS — R000 Tachycardia, unspecified: Secondary | ICD-10-CM | POA: Diagnosis not present

## 2015-12-11 DIAGNOSIS — F0280 Dementia in other diseases classified elsewhere without behavioral disturbance: Secondary | ICD-10-CM | POA: Diagnosis not present

## 2015-12-11 DIAGNOSIS — L89210 Pressure ulcer of right hip, unstageable: Secondary | ICD-10-CM | POA: Diagnosis not present

## 2015-12-11 DIAGNOSIS — E785 Hyperlipidemia, unspecified: Secondary | ICD-10-CM | POA: Diagnosis not present

## 2015-12-11 DIAGNOSIS — G309 Alzheimer's disease, unspecified: Secondary | ICD-10-CM | POA: Diagnosis not present

## 2015-12-11 DIAGNOSIS — I10 Essential (primary) hypertension: Secondary | ICD-10-CM | POA: Diagnosis not present

## 2015-12-11 DIAGNOSIS — L89620 Pressure ulcer of left heel, unstageable: Secondary | ICD-10-CM | POA: Diagnosis not present

## 2015-12-13 DIAGNOSIS — L89150 Pressure ulcer of sacral region, unstageable: Secondary | ICD-10-CM | POA: Diagnosis not present

## 2015-12-13 DIAGNOSIS — E785 Hyperlipidemia, unspecified: Secondary | ICD-10-CM | POA: Diagnosis not present

## 2015-12-13 DIAGNOSIS — I10 Essential (primary) hypertension: Secondary | ICD-10-CM | POA: Diagnosis not present

## 2015-12-13 DIAGNOSIS — R000 Tachycardia, unspecified: Secondary | ICD-10-CM | POA: Diagnosis not present

## 2015-12-13 DIAGNOSIS — L89620 Pressure ulcer of left heel, unstageable: Secondary | ICD-10-CM | POA: Diagnosis not present

## 2015-12-13 DIAGNOSIS — F0280 Dementia in other diseases classified elsewhere without behavioral disturbance: Secondary | ICD-10-CM | POA: Diagnosis not present

## 2015-12-13 DIAGNOSIS — L89210 Pressure ulcer of right hip, unstageable: Secondary | ICD-10-CM | POA: Diagnosis not present

## 2015-12-13 DIAGNOSIS — G309 Alzheimer's disease, unspecified: Secondary | ICD-10-CM | POA: Diagnosis not present

## 2015-12-16 DIAGNOSIS — L89620 Pressure ulcer of left heel, unstageable: Secondary | ICD-10-CM | POA: Diagnosis not present

## 2015-12-16 DIAGNOSIS — L89210 Pressure ulcer of right hip, unstageable: Secondary | ICD-10-CM | POA: Diagnosis not present

## 2015-12-16 DIAGNOSIS — L89150 Pressure ulcer of sacral region, unstageable: Secondary | ICD-10-CM | POA: Diagnosis not present

## 2015-12-16 DIAGNOSIS — E785 Hyperlipidemia, unspecified: Secondary | ICD-10-CM | POA: Diagnosis not present

## 2015-12-16 DIAGNOSIS — I10 Essential (primary) hypertension: Secondary | ICD-10-CM | POA: Diagnosis not present

## 2015-12-16 DIAGNOSIS — G309 Alzheimer's disease, unspecified: Secondary | ICD-10-CM | POA: Diagnosis not present

## 2015-12-16 DIAGNOSIS — F0280 Dementia in other diseases classified elsewhere without behavioral disturbance: Secondary | ICD-10-CM | POA: Diagnosis not present

## 2015-12-16 DIAGNOSIS — R000 Tachycardia, unspecified: Secondary | ICD-10-CM | POA: Diagnosis not present

## 2015-12-17 DIAGNOSIS — L89159 Pressure ulcer of sacral region, unspecified stage: Secondary | ICD-10-CM | POA: Diagnosis not present

## 2015-12-18 ENCOUNTER — Encounter: Payer: Medicare Other | Admitting: Internal Medicine

## 2015-12-18 DIAGNOSIS — D649 Anemia, unspecified: Secondary | ICD-10-CM | POA: Diagnosis not present

## 2015-12-18 DIAGNOSIS — L03317 Cellulitis of buttock: Secondary | ICD-10-CM | POA: Diagnosis not present

## 2015-12-18 DIAGNOSIS — Z880 Allergy status to penicillin: Secondary | ICD-10-CM | POA: Diagnosis not present

## 2015-12-18 DIAGNOSIS — I10 Essential (primary) hypertension: Secondary | ICD-10-CM | POA: Diagnosis not present

## 2015-12-18 DIAGNOSIS — L89154 Pressure ulcer of sacral region, stage 4: Secondary | ICD-10-CM | POA: Diagnosis not present

## 2015-12-18 DIAGNOSIS — L89312 Pressure ulcer of right buttock, stage 2: Secondary | ICD-10-CM | POA: Diagnosis not present

## 2015-12-18 DIAGNOSIS — M1990 Unspecified osteoarthritis, unspecified site: Secondary | ICD-10-CM | POA: Diagnosis not present

## 2015-12-18 DIAGNOSIS — L89213 Pressure ulcer of right hip, stage 3: Secondary | ICD-10-CM | POA: Diagnosis not present

## 2015-12-18 DIAGNOSIS — I252 Old myocardial infarction: Secondary | ICD-10-CM | POA: Diagnosis not present

## 2015-12-18 DIAGNOSIS — E43 Unspecified severe protein-calorie malnutrition: Secondary | ICD-10-CM | POA: Diagnosis not present

## 2015-12-18 DIAGNOSIS — Z87891 Personal history of nicotine dependence: Secondary | ICD-10-CM | POA: Diagnosis not present

## 2015-12-18 DIAGNOSIS — G309 Alzheimer's disease, unspecified: Secondary | ICD-10-CM | POA: Diagnosis not present

## 2015-12-19 NOTE — Progress Notes (Signed)
[name] [name] ([id number]) Visit Report for 12/18/2015 Chief Complaint Document Details Patient Name: [name] [name] B. Date of Service: 12/18/2015 3:45 PM Medical Record Patient Account Number: [id number] Number: Treating RN: [name], RN, [name], [name] [birth year] [phone] y.o. Other Clinician: Date of Birth/Sex: Female) Treating Christopherjohn Schiele Primary Care Physician/Extender: [name], [name] Physician: Referring Physician: [name] Weeks in Treatment: 9 Information Obtained from: Patient Chief Complaint The patient is here for a sacral decubitus ulcer also a more superficial wound over her right hip Electronic Signature(s) Signed: 12/19/2015 8:03:58 AM By: Baltazar Najjar MD Entered By: Baltazar Najjar on 12/19/2015 07:48:46 [name] [name] ([id number]) -------------------------------------------------------------------------------- HPI Details Patient Name: [name], [name] Date of Service: 12/18/2015 3:45 PM Medical Record Patient Account Number: [id number] Number: Treating RN: [name], RN, [name], [name] January 05, 1936 [phone] y.o. Other Clinician: Date of Birth/Sex: Female) Treating Sarahlynn Cisnero Primary Care Physician/Extender: [name], [name] Physician: Referring Physician: [name] Weeks in Treatment: 9 History of Present Illness HPI Description: 10/15/15; this is a very frail lady with advanced Alzheimer's disease who is being cared for aerobically at home by her daughter and granddaughter. She is essentially nonambulatory and seems to be minimally verbal. They tell me that she started to develop a pressure area in her sacral area in January. Initially a very small open area that progressed up. There is also a more recent area on the right hip. The patient was seen in her primary office for this problem on 2/23 at that point noted to have a stage III wound with tunneling over the sacrum. There were 2 other areas noted on the upper left and right  bilateral occipital. As well as a stage II on the right trochanter. She was referred to the ER because of tachycardia. She was hospitalized from 2/23 through 2/25. The admission this seems to open dominated by a distal fecal impaction. She was noted to have sinus tachycardia which improved. She did have a CT scan of the abdomen and pelvis during the hospitalization. In addition to the fecal impaction this noted the ulcer in the left lower gluteal region adjacent to the midline. There was air in the subcutaneous wound but no evidence of an air-fluid level to suggest an abscess. There was no comment about osteomyelitis. Since her return home she has not been eating well but is drinking. The family provides total care. I note that her albumin was 3 on admission to the hospital. Sodium got as high as 148 but was normalized by the time she left 10/24/15; this is a patient who has advanced dementia. She has a deep probing stage IV pressure ulcer over her lower sacral area. Cultures of this last week grew both Proteus and methicillin-resistant staph aureus. I and empirically put her on Cipro and doxycycline which should cover both of these. He also has a wound over her right greater trochanter. She is been followed by home health at home they're requesting lab work which I think is reasonable. I ordered a CMP, CBC with differential, sedimentation rate and a C-reactive protein 10/30/15 the patient's labs were done by home health but I think they faxed the orders to the primary physician's office. The plain x-ray I did did not show osteomyelitis however it did show fractures of the right superior and inferior pubic rami which are still not fully healed. I informed the patient's family of this. They do give a history of a fall while she was at an assisted living in [place] in November afterward  she had some trouble walking this may have been the issue. 11/13/15 the wound VAC only started 6 days ago. She is  apparently eating well 11/20/15; the patient continues with the wound VAC without any difficulties. This is being changed by home health. She is apparently eating well 12/04/15; the patient continues with a wound VAC without any difficulties. They're using collagen under the foam being [facility] by [facility]. The area over the right greater trochanter is also continued to improve she is eating well and undergoing therapeutic positioning by her granddaughter with regularity 12/18/15; the patient arrives with her daughter and granddaughter the latter of which is the primary caregiver. She continues with the wound VAC with collagen under the foam. She is apparently eating well and they are religiously offloading this area [name] [name] ([id number]) Electronic Signature(s) Signed: 12/19/2015 8:03:58 AM By: Baltazar Najjar MD Entered By: Baltazar Najjar on 12/19/2015 07:49:41 [name] [name] ([id number]) -------------------------------------------------------------------------------- Physical Exam Details Patient Name: [name] [name] B. Date of Service: 12/18/2015 3:45 PM Medical Record Patient Account Number: [id number] Number: Treating RN: [name], RN, [name], [name] 11/05/1935 [phone] y.o. Other Clinician: Date of Birth/Sex: Female) Treating Ceirra Belli Primary Care Physician/Extender: [name], [name] Physician: Referring Physician: [name] Weeks in Treatment: 9 Constitutional Patient is hypotensive.. Pulse regular and within target range for patient.Marland Kitchen Respirations regular, non-labored and within target range.. Temperature is normal and within the target range for the patient.. Patient appears stable in spite of her advanced dementia. Respiratory Respiratory effort is easy and symmetric bilaterally. Rate is normal at rest and on room air.. Bilateral breath sounds are clear and equal in all lobes with no wheezes, rales or rhonchi.. Cardiovascular Heart rhythm and  rate regular, without murmur or gallop. She does not appear to be dehydrated. Gastrointestinal (GI) Abdomen is soft and non-distended without masses or tenderness. Bowel sounds active in all quadrants.. No liver or spleen enlargement or tenderness.. Integumentary (Hair, Skin) She has 2 wounds over the right greater trochanter and then the major area over her lower sacrum/coccyx. There is no evidence of surrounding infection. Psychiatric Severe dementia without any additional worrisome features. Notes Wound exam; the area over the right greater trochanter is fully epithelialized but probably not completely healed. The major wound over the lower sacrum and coccyx continues to have no palpable bone in fact this is really contracted nicely and there is only following now for roughly 11 to 1:00. Granulation tissue continues to look very healthy there is no surrounding evidence of soft tissue infection, no erythema and no crepitus Electronic Signature(s) Signed: 12/19/2015 8:03:58 AM By: Baltazar Najjar MD Entered By: Baltazar Najjar on 12/19/2015 07:54:15 Rizzi, Rojelio [name] ([id number]) -------------------------------------------------------------------------------- Physician Orders Details Patient Name: [name] [name] B. Date of Service: 12/18/2015 3:45 PM Medical Record Patient Account Number: [id number] Number: Treating RN: Huel Coventry 19-Nov-1935 (80 y.o. Other Clinician: Date of Birth/Sex: Female) Treating Mimie Goering Primary Care Physician/Extender: [name], [name] Physician: Referring Physician: [name] Weeks in Treatment: 9 Verbal / Phone Orders: Yes Clinician: Huel Coventry Read Back and Verified: Yes Diagnosis Coding Wound Cleansing Wound #1 Sacrum o Clean wound with Normal Saline. Wound #2 Right Ischial Tuberosity o Clean wound with Normal Saline. Skin Barriers/Peri-Wound Care Wound #1 Sacrum o Skin Prep Wound #2 Right Ischial Tuberosity o  Skin Prep Primary Wound Dressing Wound #1 Sacrum o Prisma Ag - use collagen with silver under foam for NPWT Secondary Dressing Wound #2 Right Ischial Tuberosity o Boardered Foam Dressing Dressing  Change Frequency Wound #1 Sacrum o Change Dressing Monday, Wednesday, Friday Follow-up Appointments Wound #1 Sacrum o Return Appointment in 2 weeks. Wound #2 Right Ischial Tuberosity o Return Appointment in 2 weeks. Tenny, CIRE DEYARMIN ([id number]) Off-Loading Wound #1 Sacrum o Roho cushion for wheelchair - To be ordered by Home health o Turn and reposition every 2 hours o Mattress [facility] bed ordered by [facility] Wound #2 Right Ischial Tuberosity o Roho cushion for wheelchair - To be ordered by Home health o Turn and reposition every 2 hours o Mattress [facility] bed ordered by [facility] Additional Orders / Instructions Wound #1 Sacrum o Increase protein intake. o Other: - Supplements: Vitamin C, MVI, Zinc Wound #2 Right Ischial Tuberosity o Increase protein intake. o Other: - Supplements: Vitamin C, MVI, Zinc Home Health Wound #1 Sacrum o Continue Home Health Visits - [facility] o Home Health Nurse may visit PRN to address patientos wound care needs. o FACE TO FACE ENCOUNTER: MEDICARE and MEDICAID PATIENTS: I certify that this patient is under my care and that I had a face-to-face encounter that meets the physician face-to-face encounter requirements with this patient on this date. The encounter with the patient was in whole or in part for the following MEDICAL CONDITION: (primary reason for Home Healthcare) MEDICAL NECESSITY: I certify, that based on my findings, NURSING services are a medically necessary home health service. HOME BOUND STATUS: I certify that my clinical findings support that this patient is homebound (i.e., Due to illness or injury, pt requires aid of supportive devices such as crutches, cane,  wheelchairs, walkers, the use of special transportation or the assistance of another person to leave their place of residence. There is a normal inability to leave the home and doing so requires considerable and taxing effort. Other absences are for medical reasons / religious services and are infrequent or of short duration when for other reasons). o If current dressing causes regression in wound condition, may D/C ordered dressing product/s and apply Normal Saline Moist Dressing daily until next Wound Healing Center / Other MD appointment. Notify Wound Healing Center of regression in wound condition at [phone]. o Please direct any NON-WOUND related issues/requests for orders to patient's Primary Care Physician Wound #2 Right Ischial Tuberosity o Continue Home Health Visits - [facility] o Home Health Nurse may visit PRN to address patientos wound care needs. o FACE TO FACE ENCOUNTER: MEDICARE and MEDICAID PATIENTS: I certify that this patient is under my care and that I had a face-to-face encounter that meets the physician face-to-face encounter requirements with this patient on this date. The encounter with the patient was in [place], [facility]. ([id number]) whole or in part for the following MEDICAL CONDITION: (primary reason for Home Healthcare) MEDICAL NECESSITY: I certify, that based on my findings, NURSING services are a medically necessary home health service. HOME BOUND STATUS: I certify that my clinical findings support that this patient is homebound (i.e., Due to illness or injury, pt requires aid of supportive devices such as crutches, cane, wheelchairs, walkers, the use of special transportation or the assistance of another person to leave their place of residence. There is a normal inability to leave the home and doing so requires considerable and taxing effort. Other absences are for medical reasons / religious services and are infrequent or of short  duration when for other reasons). o If current dressing causes regression in wound condition, may D/C ordered dressing product/s and apply Normal  Saline Moist Dressing daily until next Wound Healing Center / Other MD appointment. Notify Wound Healing Center of regression in wound condition at [phone]. o Please direct any NON-WOUND related issues/requests for orders to patient's Primary Care Physician Negative Pressure Wound Therapy Wound #1 Sacrum o Wound VAC settings at 125/130 mmHg continuous pressure. Use BLACK/GREEN foam to wound cavity. Use WHITE foam to fill any tunnel/s and/or undermining. Change VAC dressing 3 X WEEK. Change canister as indicated when full. Nurse may titrate settings and frequency of dressing changes as clinically indicated. - HHRN to order supplies for NPWT. o Home Health Nurse may d/c VAC for s/s of increased infection, significant wound regression, or uncontrolled drainage. Notify Wound Healing Center at [phone]. o Apply contact layer over base of wound. - apply collagen with silver to wound base under foam Electronic Signature(s) Signed: 12/18/2015 5:44:23 PM By: Elliot Gurney RN, [name], Kim RN, [name] Signed: 12/19/2015 8:03:58 AM By: Baltazar Najjar MD Entered By: Elliot Gurney, RN, [name], Kim on 12/18/2015 16:16:46 Coulston, Rojelio [name] ([id number]) -------------------------------------------------------------------------------- Problem List Details Patient Name: [name] [name] B. Date of Service: 12/18/2015 3:45 PM Medical Record Patient Account Number: [id number] Number: Treating RN: [name], RN, [name], [name] [birth year] [phone] y.o. Other Clinician: Date of Birth/Sex: Female) Treating Jaydyn Bozzo Primary Care Physician/Extender: [name], [name] Physician: Referring Physician: [name] Weeks in Treatment: 9 Active Problems ICD-10 Encounter Code Description Active Date Diagnosis L89.154 Pressure ulcer of sacral region, stage 4 10/15/2015  Yes L03.317 Cellulitis of buttock 10/15/2015 Yes E43 Unspecified severe protein-calorie malnutrition 10/15/2015 Yes L89.213 Pressure ulcer of right hip, stage 3 10/15/2015 Yes Inactive Problems Resolved Problems Electronic Signature(s) Signed: 12/19/2015 8:03:58 AM By: Baltazar Najjar MD Entered By: Baltazar Najjar on 12/19/2015 07:48:38 Parish, Rojelio [name] ([id number]) -------------------------------------------------------------------------------- Progress Note Details Patient Name: [name] [name] B. Date of Service: 12/18/2015 3:45 PM Medical Record Patient Account Number: [id number] Number: Treating RN: [name], RN, [name], [name] [birth year] [phone] y.o. Other Clinician: Date of Birth/Sex: Female) Treating Talajah Slimp Primary Care Physician/Extender: [name], [name] Physician: Referring Physician: [name] Weeks in Treatment: 9 Subjective Chief Complaint Information obtained from Patient The patient is here for a sacral decubitus ulcer also a more superficial wound over her right hip History of Present Illness (HPI) 10/15/15; this is a very frail lady with advanced Alzheimer's disease who is being cared for aerobically at home by her daughter and granddaughter. She is essentially nonambulatory and seems to be minimally verbal. They tell me that she started to develop a pressure area in her sacral area in January. Initially a very small open area that progressed up. There is also a more recent area on the right hip. The patient was seen in her primary office for this problem on 2/23 at that point noted to have a stage III wound with tunneling over the sacrum. There were 2 other areas noted on the upper left and right bilateral occipital. As well as a stage II on the right trochanter. She was referred to the ER because of tachycardia. She was hospitalized from 2/23 through 2/25. The admission this seems to open dominated by a distal fecal impaction. She was noted to have  sinus tachycardia which improved. She did have a CT scan of the abdomen and pelvis during the hospitalization. In addition to the fecal impaction this noted the ulcer in the left lower gluteal region adjacent to the midline. There was air in the subcutaneous wound but no evidence of an air-fluid level to suggest an abscess. There  was no comment about osteomyelitis. Since her return home she has not been eating well but is drinking. The family provides total care. I note that her albumin was 3 on admission to the hospital. Sodium got as high as 148 but was normalized by the time she left 10/24/15; this is a patient who has advanced dementia. She has a deep probing stage IV pressure ulcer over her lower sacral area. Cultures of this last week grew both Proteus and methicillin-resistant staph aureus. I and empirically put her on Cipro and doxycycline which should cover both of these. He also has a wound over her right greater trochanter. She is been followed by home health at home they're requesting lab work which I think is reasonable. I ordered a CMP, CBC with differential, sedimentation rate and a C-reactive protein 10/30/15 the patient's labs were done by home health but I think they faxed the orders to the primary physician's office. The plain x-ray I did did not show osteomyelitis however it did show fractures of the right superior and inferior pubic rami which are still not fully healed. I informed the patient's family of this. They do give a history of a fall while she was at an assisted living in [place] in November afterward she had some trouble walking this may have been the issue. 11/13/15 the wound VAC only started 6 days ago. She is apparently eating well 11/20/15; the patient continues with the wound VAC without any difficulties. This is being changed by home health. She is apparently eating well [name] [name] B. ([id number]) 12/04/15; the patient continues with a wound VAC without any  difficulties. They're using collagen under the foam being [facility] by [facility]. The area over the right greater trochanter is also continued to improve she is eating well and undergoing therapeutic positioning by her granddaughter with regularity 12/18/15; the patient arrives with her daughter and granddaughter the latter of which is the primary caregiver. She continues with the wound VAC with collagen under the foam. She is apparently eating well and they are religiously offloading this area Objective Constitutional Patient is hypotensive.. Pulse regular and within target range for patient.Marland Kitchen. Respirations regular, non-labored and within target range.. Temperature is normal and within the target range for the patient.. Patient appears stable in spite of her advanced dementia. Vitals Time Taken: 4:03 PM, Height: 63 in, Pulse: 92 bpm, Respiratory Rate: 16 breaths/min, Blood Pressure: 97/57 mmHg. Respiratory Respiratory effort is easy and symmetric bilaterally. Rate is normal at rest and on room air.. Bilateral breath sounds are clear and equal in all lobes with no wheezes, rales or rhonchi.. Cardiovascular Heart rhythm and rate regular, without murmur or gallop. She does not appear to be dehydrated. Gastrointestinal (GI) Abdomen is soft and non-distended without masses or tenderness. Bowel sounds active in all quadrants.. No liver or spleen enlargement or tenderness.Marland Kitchen. Psychiatric Severe dementia without any additional worrisome features. General Notes: Wound exam; the area over the right greater trochanter is fully epithelialized but probably not completely healed. The major wound over the lower sacrum and coccyx continues to have no palpable bone in fact this is really contracted nicely and there is only following now for roughly 11 to 1:00. Granulation tissue continues to look very healthy there is no surrounding evidence of soft tissue infection, no erythema and no  crepitus Integumentary (Hair, Skin) She has 2 wounds over the right greater trochanter and then the major area over her lower sacrum/coccyx. There is no evidence of surrounding infection.  Wound #1 status is Open. Original cause of wound was Gradually Appeared. The wound is located on the Sacrum. The wound measures 3cm length x 2cm width x 1.8cm depth; 4.712cm^2 area and 8.482cm^3 [name] [name] B. ([id number]) volume. The wound is limited to skin breakdown. There is a large amount of serosanguineous drainage noted. The wound margin is thickened. There is large (67-100%) red granulation within the wound bed. There is a small (1-33%) amount of necrotic tissue within the wound bed including Adherent Slough. The periwound skin appearance exhibited: Moist. The periwound skin appearance did not exhibit: Callus, Crepitus, Excoriation, Fluctuance, Friable, Induration, Localized Edema, Rash, Scarring, Dry/Scaly, Maceration, Atrophie Blanche, Cyanosis, Ecchymosis, Hemosiderin Staining, Mottled, Pallor, Rubor, Erythema. Periwound temperature was noted as No Abnormality. The periwound has tenderness on palpation. Wound #2 status is Open. Original cause of wound was Gradually Appeared. The wound is located on the Right Ischial Tuberosity. The wound measures 0.1cm length x 0.1cm width x 0.1cm depth; 0.008cm^2 area and 0.001cm^3 volume. The wound is limited to skin breakdown. There is a medium amount of serosanguineous drainage noted. The wound margin is distinct with the outline attached to the wound base. There is large (67-100%) pink granulation within the wound bed. There is no necrotic tissue within the wound bed. The periwound skin appearance exhibited: Moist. The periwound skin appearance did not exhibit: Callus, Crepitus, Excoriation, Fluctuance, Friable, Induration, Localized Edema, Rash, Scarring, Dry/Scaly, Maceration, Atrophie Blanche, Cyanosis, Ecchymosis, Hemosiderin Staining, Mottled,  Pallor, Rubor, Erythema. Periwound temperature was noted as No Abnormality. The periwound has tenderness on palpation. Assessment Active Problems ICD-10 L89.154 - Pressure ulcer of sacral region, stage 4 L03.317 - Cellulitis of buttock E43 - Unspecified severe protein-calorie malnutrition L89.213 - Pressure ulcer of right hip, stage 3 Plan Wound Cleansing: Wound #1 Sacrum: Clean wound with Normal Saline. Wound #2 Right Ischial Tuberosity: Clean wound with Normal Saline. Skin Barriers/Peri-Wound Care: Wound #1 Sacrum: Skin Prep Wound #2 Right Ischial Tuberosity: Skin Prep [name] [name] B. ([id number]) Primary Wound Dressing: Wound #1 Sacrum: Prisma Ag - use collagen with silver under foam for NPWT Secondary Dressing: Wound #2 Right Ischial Tuberosity: Boardered Foam Dressing Dressing Change Frequency: Wound #1 Sacrum: Change Dressing Monday, Wednesday, Friday Follow-up Appointments: Wound #1 Sacrum: Return Appointment in 2 weeks. Wound #2 Right Ischial Tuberosity: Return Appointment in 2 weeks. Off-Loading: Wound #1 Sacrum: Roho cushion for wheelchair - To be ordered by Home health Turn and reposition every 2 hours Mattress [facility] bed ordered by [facility] Wound #2 Right Ischial Tuberosity: Roho cushion for wheelchair - To be ordered by Home health Turn and reposition every 2 hours [facility] bed ordered by [facility] Additional Orders / Instructions: Wound #1 Sacrum: Increase protein intake. Other: - Supplements: Vitamin C, MVI, Zinc Wound #2 Right Ischial Tuberosity: Increase protein intake. Other: - Supplements: Vitamin C, MVI, Zinc Home Health: Wound #1 Sacrum: Continue Home Health Visits - [facility] Home Health Nurse may visit PRN to address patient s wound care needs. FACE TO FACE ENCOUNTER: MEDICARE and MEDICAID PATIENTS: I certify that this patient is under my care and that I had a face-to-face encounter that  meets the physician face-to-face encounter requirements with this patient on this date. The encounter with the patient was in whole or in part for the following MEDICAL CONDITION: (primary reason for Home Healthcare) MEDICAL NECESSITY: I certify, that based on my findings, NURSING services are a medically necessary home health service. HOME BOUND STATUS: I certify  that my clinical findings support that this patient is homebound (i.e., Due to illness or injury, pt requires aid of supportive devices such as crutches, cane, wheelchairs, walkers, the use of special transportation or the assistance of another person to leave their place of residence. There is a normal inability to leave the home and doing so requires considerable and taxing effort. Other absences are for medical reasons / religious services and are infrequent or of short duration when for other reasons). If current dressing causes regression in wound condition, may D/C ordered dressing product/s and apply Normal Saline Moist Dressing daily until next Wound Healing Center / Other MD appointment. Notify Wound Healing Center of regression in wound condition at [phone]. Please direct any NON-WOUND related issues/requests for orders to patient's Primary Care Physician Wound #2 Right Ischial Tuberosity: Continue Home Health Visits - [facility] Home Health Nurse may visit PRN to address patient s wound care needs. Takemoto, Rojelio [name] ([id number]) FACE TO FACE ENCOUNTER: MEDICARE and MEDICAID PATIENTS: I certify that this patient is under my care and that I had a face-to-face encounter that meets the physician face-to-face encounter requirements with this patient on this date. The encounter with the patient was in whole or in part for the following MEDICAL CONDITION: (primary reason for Home Healthcare) MEDICAL NECESSITY: I certify, that based on my findings, NURSING services are a medically necessary home health service.  HOME BOUND STATUS: I certify that my clinical findings support that this patient is homebound (i.e., Due to illness or injury, pt requires aid of supportive devices such as crutches, cane, wheelchairs, walkers, the use of special transportation or the assistance of another person to leave their place of residence. There is a normal inability to leave the home and doing so requires considerable and taxing effort. Other absences are for medical reasons / religious services and are infrequent or of short duration when for other reasons). If current dressing causes regression in wound condition, may D/C ordered dressing product/s and apply Normal Saline Moist Dressing daily until next Wound Healing Center / Other MD appointment. Notify Wound Healing Center of regression in wound condition at [phone]. Please direct any NON-WOUND related issues/requests for orders to patient's Primary Care Physician Negative Pressure Wound Therapy: Wound #1 Sacrum: Wound VAC settings at 125/130 mmHg continuous pressure. Use BLACK/GREEN foam to wound cavity. Use WHITE foam to fill any tunnel/s and/or undermining. Change VAC dressing 3 X WEEK. Change canister as indicated when full. Nurse may titrate settings and frequency of dressing changes as clinically indicated. - HHRN to order supplies for NPWT. Home Health Nurse may d/c VAC for s/s of increased infection, significant wound regression, or uncontrolled drainage. Notify Wound Healing Center at [phone]. Apply contact layer over base of wound. 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 Not only is there now no exposed bone with healthy granulation but the extensive tunneling has closed down remarkably. There is absolutely no reason to change what we are currently doing. It is completely evident that she has had remarkably good care  in the home setting from predominantly her granddaughter Electronic Signature(s) Signed: 12/19/2015 8:03:58 AM By: Baltazar Najjar MD Entered By: Baltazar Najjar on 12/19/2015 07:55:25 Kleiman, Rojelio [name] ([id number]) -------------------------------------------------------------------------------- SuperBill Details Patient Name: Nobrega, [name] Date of Service: 12/18/2015 Medical Record Patient Account Number: [id number] Number: Treating RN: Huel Coventry [birth year] (  80 y.o. Other Clinician: Date of Birth/Sex: Female) Treating Mecca Barga Primary Care Physician/Extender: [name], [name] Physician: Weeks in Treatment: 9 Referring Physician: [facility], [name] Diagnosis Coding ICD-10 Codes Code Description L89.154 Pressure ulcer of sacral region, stage 4 L03.317 Cellulitis of buttock E43 Unspecified severe protein-calorie malnutrition L89.213 Pressure ulcer of right hip, stage 3 Facility Procedures CPT4 Code: 16109604 Description: 99213 - WOUND CARE VISIT-LEV 3 EST PT Modifier: Quantity: 1 Physician Procedures CPT4 Code: 5409811 Description: 99213 - WC PHYS LEVEL 3 - EST PT ICD-10 Description Diagnosis L89.154 Pressure ulcer of sacral region, stage 4 Modifier: Quantity: 1 Electronic Signature(s) Signed: 12/19/2015 8:03:58 AM By: Baltazar Najjar MD Previous Signature: 12/18/2015 5:44:23 PM Version By: Elliot Gurney RN, [name], Kim RN, [name] Entered By: Baltazar Najjar on 12/19/2015 07:55:50

## 2015-12-19 NOTE — Progress Notes (Signed)
Bong, MARLANA MCKOWEN ([id number]) Visit Report for 12/18/2015 Arrival Information Details Patient Name: Tribbey, BRENLYN B. Date of Service: 12/18/2015 3:45 PM Medical Record Patient Account Number: [id number] Number: Treating RN: Huel Coventry 1936-03-18 (80 y.o. Other Clinician: Date of Birth/Sex: Female) Treating ROBSON, [name] Primary Care Physician: [name] Physician/Extender: G Referring Physician: [name] Weeks in Treatment: 9 Visit Information History Since Last Visit Added or deleted any medications: No Patient Arrived: Wheel Chair Any new allergies or adverse No reactions: Arrival Time: 16:01 Had a fall or experienced change in No Accompanied By: caregivers activities of daily living that may Transfer Assistance: Manual affect Patient Identification Verified: Yes risk of falls: Secondary Verification Process Yes Signs or symptoms of abuse/neglect No Completed: since last visito Patient Requires Transmission-Based No Hospitalized since last visit: No Precautions: Pain Present Now: Unable to Patient Has Alerts: No Respond Electronic Signature(s) Signed: 12/18/2015 5:44:23 PM By: Elliot Gurney, RN, [name], Kim RN, [name] Entered By: Elliot Gurney, RN, [name], Kim on 12/18/2015 16:02:35 Hagenow, [name] ([id number]) -------------------------------------------------------------------------------- Clinic Level of Care Assessment Details Patient Name: Wirtanen, Munira B. Date of Service: 12/18/2015 3:45 PM Medical Record Patient Account Number: [id number] Number: Treating RN: Huel Coventry [birth year] (80 y.o. Other Clinician: Date of Birth/Sex: Female) Treating ROBSON, [name] Primary Care Physician: [name] Physician/Extender: G Referring Physician: Thedore Mins, [name] Weeks in Treatment: 9 Clinic Level of Care Assessment Items TOOL 4 Quantity Score []  - Use when only an EandM is performed on FOLLOW-UP visit 0 ASSESSMENTS - Nursing Assessment /  Reassessment X - Reassessment of Co-morbidities (includes updates in patient status) 1 10 []  - Reassessment of Adherence to Treatment Plan 0 ASSESSMENTS - Wound and Skin Assessment / Reassessment X - Simple Wound Assessment / Reassessment - one wound 1 5 []  - Complex Wound Assessment / Reassessment - multiple wounds 0 []  - Dermatologic / Skin Assessment (not related to wound area) 0 ASSESSMENTS - Focused Assessment []  - Circumferential Edema Measurements - multi extremities 0 []  - Nutritional Assessment / Counseling / Intervention 0 []  - Lower Extremity Assessment (monofilament, tuning fork, pulses) 0 []  - Peripheral Arterial Disease Assessment (using hand held doppler) 0 ASSESSMENTS - Ostomy and/or Continence Assessment and Care []  - Incontinence Assessment and Management 0 []  - Ostomy Care Assessment and Management (repouching, etc.) 0 PROCESS - Coordination of Care X - Simple Patient / Family Education for ongoing care 1 15 []  - Complex (extensive) Patient / Family Education for ongoing care 0 []  - Staff obtains Chiropractor, Records, Test Results / Process Orders 0 []  - Staff telephones HHA, Nursing Homes / Clarify orders / etc 0 Schumpert, [name] ([id number]) []  - Routine Transfer to another Facility (non-emergent condition) 0 []  - Routine Hospital Admission (non-emergent condition) 0 []  - New Admissions / Manufacturing engineer / Ordering NPWT, Apligraf, etc. 0 []  - Emergency Hospital Admission (emergent condition) 0 X - Simple Discharge Coordination 1 10 []  - Complex (extensive) Discharge Coordination 0 PROCESS - Special Needs []  - Pediatric / Minor Patient Management 0 []  - Isolation Patient Management 0 []  - Hearing / Language / Visual special needs 0 []  - Assessment of Community assistance (transportation, D/C planning, etc.) 0 []  - Additional assistance / Altered mentation 0 []  - Support Surface(s) Assessment (bed, cushion, seat, etc.) 0 INTERVENTIONS - Wound Cleansing /  Measurement X - Simple Wound Cleansing - one wound 1 5 []  - Complex Wound Cleansing - multiple wounds 0 X - Wound Imaging (photographs - any number of wounds) 1 5 []  -  Wound Tracing (instead of photographs) 0 X - Simple Wound Measurement - one wound 1 5 []  - Complex Wound Measurement - multiple wounds 0 INTERVENTIONS - Wound Dressings []  - Small Wound Dressing one or multiple wounds 0 []  - Medium Wound Dressing one or multiple wounds 0 X - Large Wound Dressing one or multiple wounds 1 20 []  - Application of Medications - topical 0 []  - Application of Medications - injection 0 Caporaso, Suzanna B. ([id number]) INTERVENTIONS - Miscellaneous []  - External ear exam 0 []  - Specimen Collection (cultures, biopsies, blood, body fluids, etc.) 0 []  - Specimen(s) / Culture(s) sent or taken to Lab for analysis 0 []  - Patient Transfer (multiple staff / Michiel SitesHoyer Lift / Similar devices) 0 []  - Simple Staple / Suture removal (25 or less) 0 []  - Complex Staple / Suture removal (26 or more) 0 []  - Hypo / Hyperglycemic Management (close monitor of Blood Glucose) 0 []  - Ankle / Brachial Index (ABI) - do not check if billed separately 0 X - Vital Signs 1 5 Has the patient been seen at the hospital within the last three years: Yes Total Score: 80 Level Of Care: New/Established - Level 3 Electronic Signature(s) Signed: 12/18/2015 5:44:23 PM By: Elliot GurneyWoody, RN, [name], Kim RN, [name] Entered By: Elliot GurneyWoody, RN, [name], Kim on 12/18/2015 16:17:59 Rathel, [name] ([id number]) -------------------------------------------------------------------------------- Encounter Discharge Information Details Patient Name: Dennard, Marylouise StacksWILMA B. Date of Service: 12/18/2015 3:45 PM Medical Record Patient Account Number: [id number] Number: Treating RN: Clover MealyAfful, RN, [name], [name] 27-Apr-1936 [phone](80 y.o. Other Clinician: Date of Birth/Sex: Female) Treating ROBSON, [name] Primary Care Physician: Leotis ShamesSINGH, [name] Physician/Extender: G Referring  Physician: Thedore MinsSINGH, [name] Weeks in Treatment: 9 Encounter Discharge Information Items Discharge Pain Level: 0 Discharge Condition: Stable Ambulatory Status: Wheelchair Discharge Destination: Home Transportation: Private Auto Accompanied By: caregivers Schedule Follow-up Appointment: Yes Medication Reconciliation completed and provided to Patient/Care Yes Gabby Rackers: Provided on Clinical Summary of Care: 12/18/2015 Form Type Recipient Paper Patient Ann MakiWJ Electronic Signature(s) Signed: 12/18/2015 5:44:23 PM By: Elliot GurneyWoody, RN, [name], Kim RN, [name] Previous Signature: 12/18/2015 4:41:24 PM Version By: Gwenlyn PerkingMoore, Shelia Entered By: Elliot GurneyWoody, RN, [name], Kim on 12/18/2015 16:42:25 Pollino, [name] ([id number]) -------------------------------------------------------------------------------- Multi Wound Chart Details Patient Name: Budde, Marylouise StacksWILMA B. Date of Service: 12/18/2015 3:45 PM Medical Record Patient Account Number: [id number] Number: Treating RN: Huel CoventryWoody, Kim 27-Apr-1936 ([age] y.o. Other Clinician: Date of Birth/Sex: Female) Treating ROBSON, [name] Primary Care Physician: Leotis ShamesSINGH, [name] Physician/Extender: G Referring Physician: Thedore MinsSINGH, [name] Weeks in Treatment: 9 Vital Signs Height(in): 63 Pulse(bpm): 92 Weight(lbs): Blood Pressure 97/57 (mmHg): Body Mass Index(BMI): Temperature(F): Respiratory Rate 16 (breaths/min): Photos: [1:No Photos] [2:No Photos] [N/A:N/A] Wound Location: [1:Sacrum] [2:Right Ischial Tuberosity N/A] Wounding Event: [1:Gradually Appeared] [2:Gradually Appeared] [N/A:N/A] Primary Etiology: [1:Pressure Ulcer] [2:Pressure Ulcer] [N/A:N/A] Comorbid History: [1:Cataracts, Anemia, Arrhythmia, Hypertension, Arrhythmia, Hypertension, Myocardial Infarction, History of pressure wounds, Osteoarthritis, Dementia] [2:Cataracts, Anemia, Myocardial Infarction, History of pressure wounds,  Osteoarthritis, Dementia] [N/A:N/A] Date Acquired: [1:09/10/2015] [2:09/10/2015]  [N/A:N/A] Weeks of Treatment: [1:9] [2:9] [N/A:N/A] Wound Status: [1:Open] [2:Open] [N/A:N/A] Measurements L x W x D 3x2x1.8 [2:0.1x0.1x0.1] [N/A:N/A] (cm) Area (cm) : [1:4.712] [2:0.008] [N/A:N/A] Volume (cm) : [1:8.482] [2:0.001] [N/A:N/A] % Reduction in Area: [1:68.80%] [2:99.00%] [N/A:N/A] % Reduction in Volume: 87.50% [2:98.70%] [N/A:N/A] Classification: [1:Category/Stage IV] [2:Category/Stage II] [N/A:N/A] Exudate Amount: [1:Large] [2:Medium] [N/A:N/A] Exudate Type: [1:Serosanguineous] [2:Serosanguineous] [N/A:N/A] Exudate Color: [1:red, brown] [2:red, brown] [N/A:N/A] Foul Odor After [1:Yes] [2:No] [N/A:N/A] Cleansing: Odor Anticipated Due to No [2:N/A] [N/A:N/A] Product Use: Wound Margin: [1:Thickened] [2:Distinct, outline attached  N/A] Granulation Amount: Large (67-100%) Large (67-100%) N/A Granulation Quality: Red Pink N/A Necrotic Amount: Small (1-33%) None Present (0%) N/A Exposed Structures: Fascia: No Fascia: No N/A Fat: No Fat: No Tendon: No Tendon: No Muscle: No Muscle: No Joint: No Joint: No Bone: No Bone: No Limited to Skin Limited to Skin Breakdown Breakdown Epithelialization: None Large (67-100%) N/A Periwound Skin Texture: Edema: No Edema: No N/A Excoriation: No Excoriation: No Induration: No Induration: No Callus: No Callus: No Crepitus: No Crepitus: No Fluctuance: No Fluctuance: No Friable: No Friable: No Rash: No Rash: No Scarring: No Scarring: No Periwound Skin Moist: Yes Moist: Yes N/A Moisture: Maceration: No Maceration: No Dry/Scaly: No Dry/Scaly: No Periwound Skin Color: Atrophie Blanche: No Atrophie Blanche: No N/A Cyanosis: No Cyanosis: No Ecchymosis: No Ecchymosis: No Erythema: No Erythema: No Hemosiderin Staining: No Hemosiderin Staining: No Mottled: No Mottled: No Pallor: No Pallor: No Rubor: No Rubor: No Temperature: No Abnormality No Abnormality N/A Tenderness on Yes Yes N/A Palpation: Wound  Preparation: Ulcer Cleansing: Ulcer Cleansing: N/A Rinsed/Irrigated with Rinsed/Irrigated with Saline Saline Topical Anesthetic Topical Anesthetic Applied: Other: lidocaine Applied: None 4% Treatment Notes Electronic Signature(s) Signed: 12/18/2015 5:44:23 PM By: Elliot Gurney, RN, [name], Kim RN, [name] Entered By: Elliot Gurney, RN, [name], Kim on 12/18/2015 16:10:28 Valdes, [name] ([id number]) Abello, [name] ([id number]) -------------------------------------------------------------------------------- Multi-Disciplinary Care Plan Details Patient Name: Fass, [name] Date of Service: 12/18/2015 3:45 PM Medical Record Patient Account Number: [id number] Number: Treating RN: Huel Coventry [birth year] (80 y.o. Other Clinician: Date of Birth/Sex: Female) Treating ROBSON, [name] Primary Care Physician: [name] Physician/Extender: G Referring Physician: Thedore Mins, [name] Weeks in Treatment: 9 Active Inactive Abuse / Safety / Falls / Self Care Management Nursing Diagnoses: Impaired home maintenance Impaired physical mobility Knowledge deficit related to: safety; personal, health (wound), emergency Potential for falls Self care deficit: actual or potential Goals: Patient will remain injury free Date Initiated: 10/15/2015 Goal Status: Active Patient/caregiver will verbalize understanding of skin care regimen Date Initiated: 10/15/2015 Goal Status: Active Patient/caregiver will verbalize/demonstrate measure taken to improve self care Date Initiated: 10/15/2015 Goal Status: Active Patient/caregiver will verbalize/demonstrate measures taken to improve the patient's personal safety Date Initiated: 10/15/2015 Goal Status: Active Patient/caregiver will verbalize/demonstrate measures taken to prevent injury and/or falls Date Initiated: 10/15/2015 Goal Status: Active Patient/caregiver will verbalize/demonstrate understanding of what to do in case of emergency Date Initiated: 10/15/2015 Goal  Status: Active Interventions: Assess fall risk on admission and as needed Assess: immobility, friction, shearing, incontinence upon admission and as needed Assess impairment of mobility on admission and as needed per policy Solivan, Karlen B. ([id number]) Assess self care needs on admission and as needed Provide education on basic hygiene Provide education on fall prevention Provide education on personal and home safety Provide education on safe transfers Treatment Activities: Education provided on Basic Hygiene : 10/15/2015 Patient referred to home care : 11/20/2015 Notes: Nutrition Nursing Diagnoses: Imbalanced nutrition Impaired glucose control: actual or potential Potential for alteratiion in Nutrition/Potential for imbalanced nutrition Goals: Patient/caregiver agrees to and verbalizes understanding of need to obtain nutritional consultation Date Initiated: 10/15/2015 Goal Status: Active Patient/caregiver agrees to and verbalizes understanding of need to use nutritional supplements and/or vitamins as prescribed Date Initiated: 10/15/2015 Goal Status: Active Patient/caregiver verbalizes understanding of need to maintain therapeutic glucose control per primary care physician Date Initiated: 10/15/2015 Goal Status: Active Interventions: Assess patient nutrition upon admission and as needed per policy Provide education on elevated blood sugars and impact on wound healing Provide education on nutrition Treatment Activities:  Education provided on Nutrition : 12/04/2015 Notes: Orientation to the Wound Care Program Nursing Diagnoses: Knowledge deficit related to the wound healing center program Guild, [facility] B. ([id number]) Goals: Patient/caregiver will verbalize understanding of the Wound Healing Center Program Date Initiated: 10/15/2015 Goal Status: Active Interventions: Provide education on orientation to the wound center Notes: Pressure Nursing Diagnoses: Knowledge deficit  related to causes and risk factors for pressure ulcer development Knowledge deficit related to management of pressures ulcers Potential for impaired tissue integrity related to pressure, friction, moisture, and shear Goals: Patient will remain free from development of additional pressure ulcers Date Initiated: 10/15/2015 Goal Status: Active Patient will remain free of pressure ulcers Date Initiated: 10/15/2015 Goal Status: Active Patient/caregiver will verbalize risk factors for pressure ulcer development Date Initiated: 10/15/2015 Goal Status: Active Patient/caregiver will verbalize understanding of pressure ulcer management Date Initiated: 10/15/2015 Goal Status: Active Interventions: Assess: immobility, friction, shearing, incontinence upon admission and as needed Assess offloading mechanisms upon admission and as needed Assess potential for pressure ulcer upon admission and as needed Provide education on pressure ulcers Treatment Activities: Patient referred for home evaluation of offloading devices/mattresses : 11/20/2015 Patient referred for pressure reduction/relief devices : 11/20/2015 Patient referred for seating evaluation to ensure proper offloading : 11/20/2015 Pressure reduction/relief device ordered : 11/20/2015 Notes: Wassink, [name] ([id number]) Wound/Skin Impairment Nursing Diagnoses: Impaired tissue integrity Knowledge deficit related to smoking impact on wound healing Knowledge deficit related to ulceration/compromised skin integrity Goals: Patient/caregiver will verbalize understanding of skin care regimen Date Initiated: 10/15/2015 Goal Status: Active Ulcer/skin breakdown will have a volume reduction of 30% by week 4 Date Initiated: 10/15/2015 Goal Status: Active Ulcer/skin breakdown will have a volume reduction of 50% by week 8 Date Initiated: 10/15/2015 Goal Status: Active Ulcer/skin breakdown will have a volume reduction of 80% by week 12 Date Initiated:  10/15/2015 Goal Status: Active Ulcer/skin breakdown will heal within 14 weeks Date Initiated: 10/15/2015 Goal Status: Active Interventions: Assess patient/caregiver ability to obtain necessary supplies Assess patient/caregiver ability to perform ulcer/skin care regimen upon admission and as needed Assess ulceration(s) every visit Provide education on ulcer and skin care Treatment Activities: Patient referred to home care : 11/20/2015 Skin care regimen initiated : 11/20/2015 Notes: Electronic Signature(s) Signed: 12/18/2015 5:44:23 PM By: Elliot Gurney, RN, [name], Kim RN, [name] Entered By: Elliot Gurney, RN, [name], Kim on 12/18/2015 16:10:21 Pacifico, [name] ([id number]) -------------------------------------------------------------------------------- Pain Assessment Details Patient Name: Yearby, [name] Date of Service: 12/18/2015 3:45 PM Medical Record Patient Account Number: [id number] Number: Treating RN: Huel Coventry [birth year] (80 y.o. Other Clinician: Date of Birth/Sex: Female) Treating ROBSON, [name] Primary Care Physician: [name] Physician/Extender: G Referring Physician: Thedore Mins, [name] Weeks in Treatment: 9 Active Problems Location of Pain Severity and Description of Pain Patient Has Paino Patient Unable to Respond Site Locations Pain Management and Medication Current Pain Management: Electronic Signature(s) Signed: 12/18/2015 5:44:23 PM By: Elliot Gurney, RN, [name], Kim RN, [name] Entered By: Elliot Gurney, RN, [name], Kim on 12/18/2015 16:02:40 Etheridge, [name] ([id number]) -------------------------------------------------------------------------------- Patient/Caregiver Education Details Patient Name: Scheunemann, [name] Date of Service: 12/18/2015 3:45 PM Medical Record Patient Account Number: [id number] Number: Treating RN: Huel Coventry 1935-12-01 (80 y.o. Other Clinician: Date of Birth/Gender: Female) Treating ROBSON, [name] Primary Care Physician: [name] Physician/Extender: G Referring Physician: [name] Weeks in Treatment: 9 Education Assessment Education Provided To: Caregiver Education Topics Provided Wound/Skin Impairment: Handouts: Other: wound care as prescribed Methods: Demonstration Responses: State content correctly Electronic Signature(s) Signed: 12/18/2015 5:44:23 PM By: Elliot Gurney, RN, [name],  Selena Batten RN, [name] Entered By: Elliot Gurney, RN, [name], Kim on 12/18/2015 16:42:53 Kimmet, [name] ([id number]) -------------------------------------------------------------------------------- Wound Assessment Details Patient Name: Rooke, Shaunie B. Date of Service: 12/18/2015 3:45 PM Medical Record Patient Account Number: [id number] Number: Treating RN: Huel Coventry [birth year] (80 y.o. Other Clinician: Date of Birth/Sex: Female) Treating ROBSON, [name] Primary Care Physician: [name] Physician/Extender: G Referring Physician: Thedore Mins, [name] Weeks in Treatment: 9 Wound Status Wound Number: 1 Primary Pressure Ulcer Etiology: Wound Location: Sacrum Wound Open Wounding Event: Gradually Appeared Status: Date Acquired: 09/10/2015 Comorbid Cataracts, Anemia, Arrhythmia, Weeks Of Treatment: 9 History: Hypertension, Myocardial Infarction, Clustered Wound: No History of pressure wounds, Osteoarthritis, Dementia Photos Photo Uploaded By: Elliot Gurney, RN, [name], Kim on 12/18/2015 17:41:06 Wound Measurements Length: (cm) 3 % Reduction in Ar Width: (cm) 2 % Reduction in Vo Depth: (cm) 1.8 Epithelialization Area: (cm) 4.712 Volume: (cm) 8.482 ea: 68.8% lume: 87.5% : None Wound Description Classification: Category/Stage IV Foul Odor After Wound Margin: Thickened Due to Product U Exudate Amount: Large Exudate Type: Serosanguineous Exudate Color: red, brown Cleansing: Yes se: No Wound Bed Granulation Amount: Large (67-100%) Exposed Structure Craun, Ginni B. ([id number]) Granulation Quality: Red Fascia Exposed:  No Necrotic Amount: Small (1-33%) Fat Layer Exposed: No Necrotic Quality: Adherent Slough Tendon Exposed: No Muscle Exposed: No Joint Exposed: No Bone Exposed: No Limited to Skin Breakdown Periwound Skin Texture Texture Color No Abnormalities Noted: No No Abnormalities Noted: No Callus: No Atrophie Blanche: No Crepitus: No Cyanosis: No Excoriation: No Ecchymosis: No Fluctuance: No Erythema: No Friable: No Hemosiderin Staining: No Induration: No Mottled: No Localized Edema: No Pallor: No Rash: No Rubor: No Scarring: No Temperature / Pain Moisture Temperature: No Abnormality No Abnormalities Noted: No Tenderness on Palpation: Yes Dry / Scaly: No Maceration: No Moist: Yes Wound Preparation Ulcer Cleansing: Rinsed/Irrigated with Saline Topical Anesthetic Applied: Other: lidocaine 4%, Treatment Notes Wound #1 (Sacrum) 1. Cleansed with: Clean wound with Normal Saline 4. Dressing Applied: Prisma Ag 8. Negative Pressure Wound Therapy Wound Vac to wound continuously at 126mm/hg pressure Notes 1 black foam applied Electronic Signature(s) Signed: 12/18/2015 5:44:23 PM By: Elliot Gurney, RN, [name], Kim RN, [name] Entered By: Elliot Gurney, RN, [name], Kim on 12/18/2015 16:09:41 Iddings, [name] ([id number]) -------------------------------------------------------------------------------- Wound Assessment Details Patient Name: Serpas, [name] Date of Service: 12/18/2015 3:45 PM Medical Record Patient Account Number: [id number] Number: Treating RN: Huel Coventry July 19, 1936 (80 y.o. Other Clinician: Date of Birth/Sex: Female) Treating ROBSON, [name] Primary Care Physician: [name] Physician/Extender: G Referring Physician: Thedore Mins, [name] Weeks in Treatment: 9 Wound Status Wound Number: 2 Primary Pressure Ulcer Etiology: Wound Location: Right Ischial Tuberosity Wound Open Wounding Event: Gradually Appeared Status: Date Acquired: 09/10/2015 Comorbid Cataracts, Anemia,  Arrhythmia, Weeks Of Treatment: 9 History: Hypertension, Myocardial Infarction, Clustered Wound: No History of pressure wounds, Osteoarthritis, Dementia Photos Photo Uploaded By: Elliot Gurney, RN, [name], Kim on 12/18/2015 17:41:07 Wound Measurements Length: (cm) 0.1 Width: (cm) 0.1 Depth: (cm) 0.1 Area: (cm) 0.008 Volume: (cm) 0.001 % Reduction in Area: 99% % Reduction in Volume: 98.7% Epithelialization: Large (67-100%) Wound Description Classification: Category/Stage II Wound Margin: Distinct, outline attached Exudate Amount: Medium Exudate Type: Serosanguineous Exudate Color: red, brown Foul Odor After Cleansing: No Wound Bed Granulation Amount: Large (67-100%) Exposed Structure Woodring, Chrystal B. ([id number]) Granulation Quality: Pink Fascia Exposed: No Necrotic Amount: None Present (0%) Fat Layer Exposed: No Tendon Exposed: No Muscle Exposed: No Joint Exposed: No Bone Exposed: No Limited to Skin Breakdown Periwound Skin Texture Texture Color No Abnormalities Noted: No No Abnormalities  Noted: No Callus: No Atrophie Blanche: No Crepitus: No Cyanosis: No Excoriation: No Ecchymosis: No Fluctuance: No Erythema: No Friable: No Hemosiderin Staining: No Induration: No Mottled: No Localized Edema: No Pallor: No Rash: No Rubor: No Scarring: No Temperature / Pain Moisture Temperature: No Abnormality No Abnormalities Noted: No Tenderness on Palpation: Yes Dry / Scaly: No Maceration: No Moist: Yes Wound Preparation Ulcer Cleansing: Rinsed/Irrigated with Saline Topical Anesthetic Applied: None Treatment Notes Wound #2 (Right Ischial Tuberosity) 1. Cleansed with: Clean wound with Normal Saline 5. Secondary Dressing Applied Bordered Foam Dressing Electronic Signature(s) Signed: 12/18/2015 5:44:23 PM By: Elliot Gurney, RN, [name], Kim RN, [name] Entered By: Elliot Gurney, RN, [name], Kim on 12/18/2015 16:10:05 Kana, [name]  ([id number]) -------------------------------------------------------------------------------- Vitals Details Patient Name: Zeoli, [name] Date of Service: 12/18/2015 3:45 PM Medical Record Patient Account Number: [id number] Number: Treating RN: Huel Coventry February 10, 1936 (80 y.o. Other Clinician: Date of Birth/Sex: Female) Treating ROBSON, [name] Primary Care Physician: [name] Physician/Extender: G Referring Physician: Thedore Mins, [name] Weeks in Treatment: 9 Vital Signs Time Taken: 16:03 Pulse (bpm): 92 Height (in): 63 Respiratory Rate (breaths/min): 16 Blood Pressure (mmHg): 97/57 Reference Range: 80 - 120 mg / dl Electronic Signature(s) Signed: 12/18/2015 5:44:23 PM By: Elliot Gurney, RN, [name], Kim RN, [name] Entered By: Elliot Gurney, RN, [name], Kim on 12/18/2015 16:03:29

## 2015-12-20 DIAGNOSIS — L89210 Pressure ulcer of right hip, unstageable: Secondary | ICD-10-CM | POA: Diagnosis not present

## 2015-12-20 DIAGNOSIS — G309 Alzheimer's disease, unspecified: Secondary | ICD-10-CM | POA: Diagnosis not present

## 2015-12-20 DIAGNOSIS — F0280 Dementia in other diseases classified elsewhere without behavioral disturbance: Secondary | ICD-10-CM | POA: Diagnosis not present

## 2015-12-20 DIAGNOSIS — L89620 Pressure ulcer of left heel, unstageable: Secondary | ICD-10-CM | POA: Diagnosis not present

## 2015-12-20 DIAGNOSIS — E785 Hyperlipidemia, unspecified: Secondary | ICD-10-CM | POA: Diagnosis not present

## 2015-12-20 DIAGNOSIS — I10 Essential (primary) hypertension: Secondary | ICD-10-CM | POA: Diagnosis not present

## 2015-12-20 DIAGNOSIS — R000 Tachycardia, unspecified: Secondary | ICD-10-CM | POA: Diagnosis not present

## 2015-12-20 DIAGNOSIS — L89150 Pressure ulcer of sacral region, unstageable: Secondary | ICD-10-CM | POA: Diagnosis not present

## 2015-12-21 DIAGNOSIS — L89154 Pressure ulcer of sacral region, stage 4: Secondary | ICD-10-CM | POA: Diagnosis not present

## 2015-12-21 DIAGNOSIS — L89150 Pressure ulcer of sacral region, unstageable: Secondary | ICD-10-CM | POA: Diagnosis not present

## 2015-12-23 DIAGNOSIS — I10 Essential (primary) hypertension: Secondary | ICD-10-CM | POA: Diagnosis not present

## 2015-12-23 DIAGNOSIS — E785 Hyperlipidemia, unspecified: Secondary | ICD-10-CM | POA: Diagnosis not present

## 2015-12-23 DIAGNOSIS — L89150 Pressure ulcer of sacral region, unstageable: Secondary | ICD-10-CM | POA: Diagnosis not present

## 2015-12-23 DIAGNOSIS — F0280 Dementia in other diseases classified elsewhere without behavioral disturbance: Secondary | ICD-10-CM | POA: Diagnosis not present

## 2015-12-23 DIAGNOSIS — G309 Alzheimer's disease, unspecified: Secondary | ICD-10-CM | POA: Diagnosis not present

## 2015-12-23 DIAGNOSIS — L89210 Pressure ulcer of right hip, unstageable: Secondary | ICD-10-CM | POA: Diagnosis not present

## 2015-12-23 DIAGNOSIS — L89620 Pressure ulcer of left heel, unstageable: Secondary | ICD-10-CM | POA: Diagnosis not present

## 2015-12-23 DIAGNOSIS — R000 Tachycardia, unspecified: Secondary | ICD-10-CM | POA: Diagnosis not present

## 2015-12-25 DIAGNOSIS — L89210 Pressure ulcer of right hip, unstageable: Secondary | ICD-10-CM | POA: Diagnosis not present

## 2015-12-25 DIAGNOSIS — R000 Tachycardia, unspecified: Secondary | ICD-10-CM | POA: Diagnosis not present

## 2015-12-25 DIAGNOSIS — G309 Alzheimer's disease, unspecified: Secondary | ICD-10-CM | POA: Diagnosis not present

## 2015-12-25 DIAGNOSIS — L89150 Pressure ulcer of sacral region, unstageable: Secondary | ICD-10-CM | POA: Diagnosis not present

## 2015-12-25 DIAGNOSIS — L89620 Pressure ulcer of left heel, unstageable: Secondary | ICD-10-CM | POA: Diagnosis not present

## 2015-12-25 DIAGNOSIS — F0280 Dementia in other diseases classified elsewhere without behavioral disturbance: Secondary | ICD-10-CM | POA: Diagnosis not present

## 2015-12-25 DIAGNOSIS — E785 Hyperlipidemia, unspecified: Secondary | ICD-10-CM | POA: Diagnosis not present

## 2015-12-25 DIAGNOSIS — I10 Essential (primary) hypertension: Secondary | ICD-10-CM | POA: Diagnosis not present

## 2015-12-27 DIAGNOSIS — L89210 Pressure ulcer of right hip, unstageable: Secondary | ICD-10-CM | POA: Diagnosis not present

## 2015-12-27 DIAGNOSIS — E785 Hyperlipidemia, unspecified: Secondary | ICD-10-CM | POA: Diagnosis not present

## 2015-12-27 DIAGNOSIS — L89620 Pressure ulcer of left heel, unstageable: Secondary | ICD-10-CM | POA: Diagnosis not present

## 2015-12-27 DIAGNOSIS — G309 Alzheimer's disease, unspecified: Secondary | ICD-10-CM | POA: Diagnosis not present

## 2015-12-27 DIAGNOSIS — F0280 Dementia in other diseases classified elsewhere without behavioral disturbance: Secondary | ICD-10-CM | POA: Diagnosis not present

## 2015-12-27 DIAGNOSIS — L89150 Pressure ulcer of sacral region, unstageable: Secondary | ICD-10-CM | POA: Diagnosis not present

## 2015-12-27 DIAGNOSIS — I10 Essential (primary) hypertension: Secondary | ICD-10-CM | POA: Diagnosis not present

## 2015-12-27 DIAGNOSIS — R000 Tachycardia, unspecified: Secondary | ICD-10-CM | POA: Diagnosis not present

## 2015-12-30 DIAGNOSIS — E785 Hyperlipidemia, unspecified: Secondary | ICD-10-CM | POA: Diagnosis not present

## 2015-12-30 DIAGNOSIS — I10 Essential (primary) hypertension: Secondary | ICD-10-CM | POA: Diagnosis not present

## 2015-12-30 DIAGNOSIS — L89150 Pressure ulcer of sacral region, unstageable: Secondary | ICD-10-CM | POA: Diagnosis not present

## 2015-12-30 DIAGNOSIS — R000 Tachycardia, unspecified: Secondary | ICD-10-CM | POA: Diagnosis not present

## 2015-12-30 DIAGNOSIS — L89620 Pressure ulcer of left heel, unstageable: Secondary | ICD-10-CM | POA: Diagnosis not present

## 2015-12-30 DIAGNOSIS — L89210 Pressure ulcer of right hip, unstageable: Secondary | ICD-10-CM | POA: Diagnosis not present

## 2015-12-30 DIAGNOSIS — F0280 Dementia in other diseases classified elsewhere without behavioral disturbance: Secondary | ICD-10-CM | POA: Diagnosis not present

## 2015-12-30 DIAGNOSIS — G309 Alzheimer's disease, unspecified: Secondary | ICD-10-CM | POA: Diagnosis not present

## 2016-01-01 ENCOUNTER — Encounter: Payer: Medicare Other | Admitting: Internal Medicine

## 2016-01-01 DIAGNOSIS — I252 Old myocardial infarction: Secondary | ICD-10-CM | POA: Diagnosis not present

## 2016-01-01 DIAGNOSIS — L03317 Cellulitis of buttock: Secondary | ICD-10-CM | POA: Diagnosis not present

## 2016-01-01 DIAGNOSIS — L89312 Pressure ulcer of right buttock, stage 2: Secondary | ICD-10-CM | POA: Diagnosis not present

## 2016-01-01 DIAGNOSIS — L89154 Pressure ulcer of sacral region, stage 4: Secondary | ICD-10-CM | POA: Diagnosis not present

## 2016-01-01 DIAGNOSIS — I10 Essential (primary) hypertension: Secondary | ICD-10-CM | POA: Diagnosis not present

## 2016-01-01 DIAGNOSIS — M1990 Unspecified osteoarthritis, unspecified site: Secondary | ICD-10-CM | POA: Diagnosis not present

## 2016-01-01 DIAGNOSIS — L89213 Pressure ulcer of right hip, stage 3: Secondary | ICD-10-CM | POA: Diagnosis not present

## 2016-01-01 DIAGNOSIS — G309 Alzheimer's disease, unspecified: Secondary | ICD-10-CM | POA: Diagnosis not present

## 2016-01-01 DIAGNOSIS — Z880 Allergy status to penicillin: Secondary | ICD-10-CM | POA: Diagnosis not present

## 2016-01-01 DIAGNOSIS — D649 Anemia, unspecified: Secondary | ICD-10-CM | POA: Diagnosis not present

## 2016-01-01 DIAGNOSIS — E43 Unspecified severe protein-calorie malnutrition: Secondary | ICD-10-CM | POA: Diagnosis not present

## 2016-01-01 DIAGNOSIS — Z87891 Personal history of nicotine dependence: Secondary | ICD-10-CM | POA: Diagnosis not present

## 2016-01-02 NOTE — Progress Notes (Signed)
[name] ([id number]) Visit Report for 01/01/2016 Chief Complaint Document Details Patient Name: [name], [name]. Date of Service: 01/01/2016 3:45 PM [name] Record Patient Account Number: [id number] Number: Treating RN: [name] Mealy, RN, [name], [name] 02-Feb-1936 [phone] y.o. Other Clinician: Date of Birth/Sex: Female) Treating [name] Primary Care Physician/Extender: [name] Physician: Referring Physician: Leotis [name] [name] in Treatment: 11 Information Obtained from: Patient Chief Complaint The patient is here for a sacral decubitus ulcer also a more superficial wound over her right hip Electronic Signature(s) Signed: 01/01/2016 5:25:36 PM By: [name] Najjar MD Entered By: [name] on 01/01/2016 16:52:49 [name], [name] ([id number]) -------------------------------------------------------------------------------- HPI Details Patient Name: [name], [name]. Date of Service: 01/01/2016 3:45 PM [name] Record Patient Account Number: [id number] Number: Treating RN: [name] Mealy, RN, [name], [name] 1936-07-09 [phone] y.o. Other Clinician: Date of Birth/Sex: Female) Treating [name] Primary Care Physician/Extender: [name] Physician: Referring Physician: Leotis [name] [name] in Treatment: 11 History of Present Illness HPI Description: 10/15/15; this is a very frail lady with advanced Alzheimer's disease who is being cared for aerobically at home by her daughter and granddaughter. She is essentially nonambulatory and seems to be minimally verbal. They tell me that she started to develop a pressure area in her sacral area in January. Initially a very small open area that progressed up. There is also a more recent area on the right hip. The patient was seen in her primary office for this problem on 2/23 at that point noted to have a stage III wound with tunneling over the sacrum. There were 2 other areas noted on the upper left and right  bilateral occipital. As well as a stage II on the right trochanter. She was referred to the ER because of tachycardia. She was hospitalized from 2/23 through 2/25. The admission this seems to open dominated by a distal fecal impaction. She was noted to have sinus tachycardia which improved. She did have a CT scan of the abdomen and pelvis during the hospitalization. In addition to the fecal impaction this noted the ulcer in the left lower gluteal region adjacent to the midline. There was air in the subcutaneous wound but no evidence of an air-fluid level to suggest an abscess. There was no comment about osteomyelitis. Since her return home she has not been eating well but is drinking. The family provides total care. I note that her albumin was 3 on admission to the hospital. Sodium got as high as 148 but was normalized by the time she left 10/24/15; this is a patient who has advanced dementia. She has a deep probing stage IV pressure ulcer over her lower sacral area. Cultures of this last week grew both Proteus and methicillin-resistant staph aureus. I and empirically put her on Cipro and doxycycline which should cover both of these. He also has a wound over her right greater trochanter. She is been followed by home health at home they're requesting lab work which I think is reasonable. I ordered a CMP, CBC with differential, sedimentation rate and a C-reactive protein 10/30/15 the patient's labs were done by home health but I think they faxed the orders to the primary physician's office. The plain x-ray I did did not show osteomyelitis however it did show fractures of the right superior and inferior pubic rami which are still not fully healed. I informed the patient's family of this. They do give a history of a fall while she was at an assisted living in [place] in November afterward  she had some trouble walking this may have been the issue. 11/13/15 the wound VAC only started 6 days ago. She is  apparently eating well 11/20/15; the patient continues with the wound VAC without any difficulties. This is being changed by home health. She is apparently eating well 12/04/15; the patient continues with a wound VAC without any difficulties. They're using collagen under the foam being changed by [facility]. The area over the right greater trochanter is also continued to improve she is eating well and undergoing therapeutic positioning by her granddaughter with regularity 12/18/15; the patient arrives with her daughter and granddaughter the latter of which is the primary caregiver. She continues with the wound VAC with collagen under the foam. She is apparently eating well and they are religiously offloading this area [name], [name]. ([id number]) 01/01/16; this is a patient I follow every 2 [name]. She has severe dementia and developed a stage IV pressure ulcer over her lower sacrum and coccyx. In spite of the fact that her Alzheimer's disease is advanced, we have had excellent results using collagen under the foam of wound VAC. She has also had meticulous care for predominantly a granddaughter at home. Electronic Signature(s) Signed: 01/01/2016 5:25:36 PM By: [name] Najjar MD Entered By: [name] on 01/01/2016 16:54:29 [name], [name] ([id number]) -------------------------------------------------------------------------------- Physical Exam Details Patient Name: [name]. Date of Service: 01/01/2016 3:45 PM [name] Record Patient Account Number: [id number] Number: Treating RN: [name] Mealy, RN, [name], [name] September 08, 1935 [phone] y.o. Other Clinician: Date of Birth/Sex: Female) Treating [name] Primary Care Physician/Extender: [name] Physician: Referring Physician: Thedore [name], [name] [name] in Treatment: 11 Constitutional Sitting or standing Blood Pressure is within target range for patient.. Pulse regular and within target range for patient.Marland Kitchen  Respirations regular, non-labored and within target range.. Temperature is normal and within the target range for the patient.. Frail elderly woman on verbal. Eyes Conjunctivae clear. No discharge.Marland Kitchen Respiratory Respiratory effort is easy and symmetric bilaterally. Rate is normal at rest and on room air.. Cardiovascular No evidence of dehydration. Gastrointestinal (GI) Abdomen is soft and non-distended without masses or tenderness. Bowel sounds active in all quadrants.. Psychiatric Advanced dementia. Notes Wound exam; the major wound over her sacrum and coccyx as a healthy-appearing wound surface. There is 2.5 cm of tunneling between 12 and 2:00. Her granulation tissue appears to be healthy there is no evidence of surrounding infection Electronic Signature(s) Signed: 01/01/2016 5:25:36 PM By: [name] Najjar MD Entered By: [name] on 01/01/2016 16:56:31 [name], [name] ([id number]) -------------------------------------------------------------------------------- Physician Orders Details Patient Name: Woolston, Alethea [name]. Date of Service: 01/01/2016 3:45 PM [name] Record Patient Account Number: [id number] Number: Treating RN: [name] Mealy, RN, [name], [name] 04-14-1936 [phone] y.o. Other Clinician: Date of Birth/Sex: Female) Treating Naleah Kofoed Primary Care Physician/Extender: [name] Physician: Referring Physician: Leotis [name] [name] in Treatment: 65 Verbal / Phone Orders: Yes Clinician: [name], RN, [name], [name] Read Back and Verified: Yes Diagnosis Coding Wound Cleansing Wound #1 Sacrum o Clean wound with Normal Saline. Wound #2 Right Ischial Tuberosity o Clean wound with Normal Saline. Skin Barriers/Peri-Wound Care Wound #1 Sacrum o Skin Prep Wound #2 Right Ischial Tuberosity o Skin Prep Primary Wound Dressing Wound #1 Sacrum o Prisma Ag - use collagen with silver under foam for NPWT pack into 1 O'clock tunnel Secondary Dressing Wound #2  Right Ischial Tuberosity o Boardered Foam Dressing Dressing Change Frequency Wound #1 Sacrum o Change Dressing Monday, Wednesday, Friday Follow-up Appointments Wound #1 Sacrum o Return Appointment  in 2 [name]. Wound #2 Right Ischial Tuberosity o Return Appointment in 2 [name]. [name], [name] SUTTLES. ([id number]) Off-Loading Wound #1 Sacrum o Turn and reposition every 2 hours o Mattress - Hospital bed in place Wound #2 Right Ischial Tuberosity o Turn and reposition every 2 hours o Mattress - Hospital bed in place Additional Orders / Instructions Wound #1 Sacrum o Increase protein intake. o Other: - Supplements: Vitamin C, MVI, Zinc Wound #2 Right Ischial Tuberosity o Increase protein intake. o Other: - Supplements: Vitamin C, MVI, Zinc Home Health Wound #1 Sacrum o Continue [facility] Visits - [facility] o Home Health Nurse may visit PRN to address patientos wound care needs. o FACE TO FACE ENCOUNTER: MEDICARE and MEDICAID PATIENTS: I certify that this patient is under my care and that I had a face-to-face encounter that meets the physician face-to-face encounter requirements with this patient on this date. The encounter with the patient was in whole or in part for the following [name] CONDITION: (primary reason for Home Healthcare) [name] NECESSITY: I certify, that based on my findings, NURSING services are a medically necessary home health service. HOME BOUND STATUS: I certify that my clinical findings support that this patient is homebound (i.e., Due to illness or injury, pt requires aid of supportive devices such as crutches, cane, wheelchairs, walkers, the use of special transportation or the assistance of another person to leave their place of residence. There is a normal inability to leave the home and doing so requires considerable and taxing effort. Other absences are for [name] reasons / religious services and are infrequent or of  short duration when for other reasons). o If current dressing causes regression in wound condition, may D/C ordered dressing product/s and apply Normal Saline Moist Dressing daily until next Wound Healing Center / Other MD appointment. Notify Wound Healing Center of regression in wound condition at [phone]. o Please direct any NON-WOUND related issues/requests for orders to patient's Primary Care Physician Wound #2 Right Ischial Tuberosity o Continue Home Health Visits - [facility] o Home Health Nurse may visit PRN to address patientos wound care needs. o FACE TO FACE ENCOUNTER: MEDICARE and MEDICAID PATIENTS: I certify that this patient is under my care and that I had a face-to-face encounter that meets the physician face-to-face encounter requirements with this patient on this date. The encounter with the patient was in whole or in part for the following [name] CONDITION: (primary reason for Home Healthcare) Whitener, [name] ([id number]) [name] NECESSITY: I certify, that based on my findings, NURSING services are a medically necessary home health service. HOME BOUND STATUS: I certify that my clinical findings support that this patient is homebound (i.e., Due to illness or injury, pt requires aid of supportive devices such as crutches, cane, wheelchairs, walkers, the use of special transportation or the assistance of another person to leave their place of residence. There is a normal inability to leave the home and doing so requires considerable and taxing effort. Other absences are for [name] reasons / religious services and are infrequent or of short duration when for other reasons). o If current dressing causes regression in wound condition, may D/C ordered dressing product/s and apply Normal Saline Moist Dressing daily until next Wound Healing Center / Other MD appointment. Notify Wound Healing Center of regression in wound condition at [phone]. o  Please direct any NON-WOUND related issues/requests for orders to patient's Primary Care Physician Negative Pressure Wound Therapy Wound #1 Sacrum o Wound VAC settings  at 125/130 mmHg continuous pressure. Use BLACK/GREEN foam to wound cavity. Use WHITE foam to fill any tunnel/s and/or undermining. Change VAC dressing 3 X WEEK. Change canister as indicated when full. Nurse may titrate settings and frequency of dressing changes as clinically indicated. - HHRN to order supplies for NPWT. PACK FOAM INTO 1 O'CLOCK TUNNEL o Home Health Nurse may d/c VAC for s/s of increased infection, significant wound regression, or uncontrolled drainage. Notify Wound Healing Center at [phone]. o Apply contact layer over base of wound. - apply collagen with silver to wound base under foam Electronic Signature(s) Signed: 01/01/2016 5:20:16 PM By: Elpidio Eric [name], RN Signed: 01/01/2016 5:25:36 PM By: [name] Najjar MD Entered By: Elpidio Eric on 01/01/2016 16:30:05 Petron, [name] ([id number]) -------------------------------------------------------------------------------- Problem List Details Patient Name: [name], [name]. Date of Service: 01/01/2016 3:45 PM [name] Record Patient Account Number: [id number] Number: Treating RN: [name] Mealy, RN, [name], [name] 1935/09/27 [phone] y.o. Other Clinician: Date of Birth/Sex: Female) Treating Lora Glomski Primary Care Physician/Extender: [name] Physician: Referring Physician: Leotis [name] [name] in Treatment: 11 Active Problems ICD-10 Encounter Code Description Active Date Diagnosis L89.154 Pressure ulcer of sacral region, stage 4 10/15/2015 Yes L03.317 Cellulitis of buttock 10/15/2015 Yes E43 Unspecified severe protein-calorie malnutrition 10/15/2015 Yes L89.213 Pressure ulcer of right hip, stage 3 10/15/2015 Yes Inactive Problems Resolved Problems Electronic Signature(s) Signed: 01/01/2016 5:25:36 PM By: [name] Najjar MD Entered By:  [name] on 01/01/2016 16:52:34 Dudgeon, [name] ([id number]) -------------------------------------------------------------------------------- Progress Note Details Patient Name: [name], [name]. Date of Service: 01/01/2016 3:45 PM [name] Record Patient Account Number: [id number] Number: Treating RN: [name] Mealy, RN, [name], [name] Mar 23, 1936 [phone] y.o. Other Clinician: Date of Birth/Sex: Female) Treating Ethelyne Erich Primary Care Physician/Extender: [name] Physician: Referring Physician: Leotis [name] [name] in Treatment: 11 Subjective Chief Complaint Information obtained from Patient The patient is here for a sacral decubitus ulcer also a more superficial wound over her right hip History of Present Illness (HPI) 10/15/15; this is a very frail lady with advanced Alzheimer's disease who is being cared for aerobically at home by her daughter and granddaughter. She is essentially nonambulatory and seems to be minimally verbal. They tell me that she started to develop a pressure area in her sacral area in January. Initially a very small open area that progressed up. There is also a more recent area on the right hip. The patient was seen in her primary office for this problem on 2/23 at that point noted to have a stage III wound with tunneling over the sacrum. There were 2 other areas noted on the upper left and right bilateral occipital. As well as a stage II on the right trochanter. She was referred to the ER because of tachycardia. She was hospitalized from 2/23 through 2/25. The admission this seems to open dominated by a distal fecal impaction. She was noted to have sinus tachycardia which improved. She did have a CT scan of the abdomen and pelvis during the hospitalization. In addition to the fecal impaction this noted the ulcer in the left lower gluteal region adjacent to the midline. There was air in the subcutaneous wound but no evidence of an air-fluid level  to suggest an abscess. There was no comment about osteomyelitis. Since her return home she has not been eating well but is drinking. The family provides total care. I note that her albumin was 3 on admission to the hospital. Sodium got as high as 148 but was normalized by the time  she left 10/24/15; this is a patient who has advanced dementia. She has a deep probing stage IV pressure ulcer over her lower sacral area. Cultures of this last week grew both Proteus and methicillin-resistant staph aureus. I and empirically put her on Cipro and doxycycline which should cover both of these. He also has a wound over her right greater trochanter. She is been followed by home health at home they're requesting lab work which I think is reasonable. I ordered a CMP, CBC with differential, sedimentation rate and a C-reactive protein 10/30/15 the patient's labs were done by home health but I think they faxed the orders to the primary physician's office. The plain x-ray I did did not show osteomyelitis however it did show fractures of the right superior and inferior pubic rami which are still not fully healed. I informed the patient's family of this. They do give a history of a fall while she was at an assisted living in [place] in November afterward she had some trouble walking this may have been the issue. 11/13/15 the wound VAC only started 6 days ago. She is apparently eating well 11/20/15; the patient continues with the wound VAC without any difficulties. This is being changed by home health. She is apparently eating well Gundry, Tasneem [name]. ([id number]) 12/04/15; the patient continues with a wound VAC without any difficulties. They're using collagen under the foam being [facility] by [facility]. The area over the right greater trochanter is also continued to improve she is eating well and undergoing therapeutic positioning by her granddaughter with regularity 12/18/15; the patient arrives with her daughter  and granddaughter the latter of which is the primary caregiver. She continues with the wound VAC with collagen under the foam. She is apparently eating well and they are religiously offloading this area 01/01/16; this is a patient I follow every 2 [name]. She has severe dementia and developed a stage IV pressure ulcer over her lower sacrum and coccyx. In spite of the fact that her Alzheimer's disease is advanced, we have had excellent results using collagen under the foam of wound VAC. She has also had meticulous care for predominantly a granddaughter at home. Objective Constitutional Sitting or standing Blood Pressure is within target range for patient.. Pulse regular and within target range for patient.Marland Kitchen Respirations regular, non-labored and within target range.. Temperature is normal and within the target range for the patient.. Frail elderly woman on verbal. Vitals Time Taken: 4:05 PM, Height: 63 in, Temperature: 98.4 F, Pulse: 88 bpm, Respiratory Rate: 16 breaths/min, Blood Pressure: 118/61 mmHg. Eyes Conjunctivae clear. No discharge.Marland Kitchen Respiratory Respiratory effort is easy and symmetric bilaterally. Rate is normal at rest and on room air.. Cardiovascular No evidence of dehydration. Gastrointestinal (GI) Abdomen is soft and non-distended without masses or tenderness. Bowel sounds active in all quadrants.. Psychiatric Advanced dementia. General Notes: Wound exam; the major wound over her sacrum and coccyx as a healthy-appearing wound surface. There is 2.5 cm of tunneling between 12 and 2:00. Her granulation tissue appears to be healthy there is no evidence of surrounding infection Integumentary (Hair, Skin) Wound #1 status is Open. Original cause of wound was Gradually Appeared. The wound is located on the Sacrum. The wound measures 3cm length x 1.8cm width x 1cm depth; 4.241cm^2 area and 4.241cm^3 [name], [name]. ([id number]) volume. The wound is limited to skin breakdown. There  is no undermining noted, however, there is tunneling at 1:00 with a maximum distance of 2.5cm. There is a large amount of  serosanguineous drainage noted. The wound margin is thickened. There is large (67-100%) red granulation within the wound bed. There is a small (1-33%) amount of necrotic tissue within the wound bed including Adherent Slough. The periwound skin appearance exhibited: Moist. The periwound skin appearance did not exhibit: Callus, Crepitus, Excoriation, Fluctuance, Friable, Induration, Localized Edema, Rash, Scarring, Dry/Scaly, Maceration, Atrophie Blanche, Cyanosis, Ecchymosis, Hemosiderin Staining, Mottled, Pallor, Rubor, Erythema. Periwound temperature was noted as No Abnormality. The periwound has tenderness on palpation. Wound #2 status is Healed - Epithelialized. Original cause of wound was Gradually Appeared. The wound is located on the Right Ischial Tuberosity. The wound measures 0cm length x 0cm width x 0cm depth; 0cm^2 area and 0cm^3 volume. The wound is limited to skin breakdown. There is no tunneling or undermining noted. There is a none present amount of drainage noted. The wound margin is distinct with the outline attached to the wound base. There is no granulation within the wound bed. There is no necrotic tissue within the wound bed. The periwound skin appearance exhibited: Dry/Scaly. The periwound skin appearance did not exhibit: Callus, Crepitus, Excoriation, Fluctuance, Friable, Induration, Localized Edema, Rash, Scarring, Maceration, Moist, Atrophie Blanche, Cyanosis, Ecchymosis, Hemosiderin Staining, Mottled, Pallor, Rubor, Erythema. Periwound temperature was noted as No Abnormality. Assessment Active Problems ICD-10 L89.154 - Pressure ulcer of sacral region, stage 4 L03.317 - Cellulitis of buttock E43 - Unspecified severe protein-calorie malnutrition L89.213 - Pressure ulcer of right hip, stage 3 Plan Wound Cleansing: Wound #1 Sacrum: Clean wound with  Normal Saline. Wound #2 Right Ischial Tuberosity: Clean wound with Normal Saline. Skin Barriers/Peri-Wound Care: Wound #1 Sacrum: Skin Prep Wound #2 Right Ischial Tuberosity: Skin Prep [name], [name]. ([id number]) Primary Wound Dressing: Wound #1 Sacrum: Prisma Ag - use collagen with silver under foam for NPWT pack into 1 O'clock tunnel Secondary Dressing: Wound #2 Right Ischial Tuberosity: Boardered Foam Dressing Dressing Change Frequency: Wound #1 Sacrum: Change Dressing Monday, Wednesday, Friday Follow-up Appointments: Wound #1 Sacrum: Return Appointment in 2 [name]. Wound #2 Right Ischial Tuberosity: Return Appointment in 2 [name]. Off-Loading: Wound #1 Sacrum: Turn and reposition every 2 hours Mattress - Hospital bed in place Wound #2 Right Ischial Tuberosity: Turn and reposition every 2 hours Mattress - Hospital bed in place Additional Orders / Instructions: Wound #1 Sacrum: Increase protein intake. Other: - Supplements: Vitamin C, MVI, Zinc Wound #2 Right Ischial Tuberosity: Increase protein intake. Other: - Supplements: Vitamin C, MVI, Zinc Home Health: Wound #1 Sacrum: Continue [facility] Visits - [facility] Home Health Nurse may visit PRN to address patient s wound care needs. FACE TO FACE ENCOUNTER: MEDICARE and MEDICAID PATIENTS: I certify that this patient is under my care and that I had a face-to-face encounter that meets the physician face-to-face encounter requirements with this patient on this date. The encounter with the patient was in whole or in part for the following [name] CONDITION: (primary reason for Home Healthcare) [name] NECESSITY: I certify, that based on my findings, NURSING services are a medically necessary home health service. HOME BOUND STATUS: I certify that my clinical findings support that this patient is homebound (i.e., Due to illness or injury, pt requires aid of supportive devices such as crutches, cane, wheelchairs,  walkers, the use of special transportation or the assistance of another person to leave their place of residence. There is a normal inability to leave the home and doing so requires considerable and taxing effort. Other absences are for [name] reasons / religious services and are infrequent  or of short duration when for other reasons). If current dressing causes regression in wound condition, may D/C ordered dressing product/s and apply Normal Saline Moist Dressing daily until next Wound Healing Center / Other MD appointment. Notify Wound [facility] in wound condition at [phone]. Please direct any NON-WOUND related issues/requests for orders to patient's Primary Care Physician Wound #2 Right Ischial Tuberosity: Continue Home Health Visits - [facility] Health Nurse may visit PRN to address patient s wound care needs. FACE TO FACE ENCOUNTER: MEDICARE and MEDICAID PATIENTS: I certify that this patient is under my care and that I had a face-to-face encounter that meets the physician face-to-face encounter [name], [name]. ([id number]) requirements with this patient on this date. The encounter with the patient was in whole or in part for the following [name] CONDITION: (primary reason for Home Healthcare) [name] NECESSITY: I certify, that based on my findings, NURSING services are a medically necessary home health service. HOME BOUND STATUS: I certify that my clinical findings support that this patient is homebound (i.e., Due to illness or injury, pt requires aid of supportive devices such as crutches, cane, wheelchairs, walkers, the use of special transportation or the assistance of another person to leave their place of residence. There is a normal inability to leave the home and doing so requires considerable and taxing effort. Other absences are for [name] reasons / religious services and are infrequent or of short duration when for other reasons). If  current dressing causes regression in wound condition, may D/C ordered dressing product/s and apply Normal Saline Moist Dressing daily until next Wound Healing Center / Other MD appointment. Notify Wound Healing Center of regression in wound condition at [phone]. Please direct any NON-WOUND related issues/requests for orders to patient's Primary Care Physician Negative Pressure Wound Therapy: Wound #1 Sacrum: Wound VAC settings at 125/130 mmHg continuous pressure. Use BLACK/GREEN foam to wound cavity. Use WHITE foam to fill any tunnel/s and/or undermining. Change VAC dressing 3 X WEEK. Change canister as indicated when full. Nurse may titrate settings and frequency of dressing changes as clinically indicated. - HHRN to order supplies for NPWT. PACK FOAM INTO 1 O'CLOCK TUNNEL Home Health Nurse may d/c VAC for s/s of increased infection, significant wound regression, or uncontrolled drainage. Notify Wound Healing Center at [phone]. Apply contact layer over base of wound. - apply collagen with silver to wound base under foam #1 the wound is tremendously improved in terms of overall dimensions however I'm not exactly sure we have been paying enough attention to the tunneling between 12 and 2:00 which today measures at roughly 20 half centimeters. I pointed this out to the daughter and granddaughter and we will place this in the orders to home health were changing this on Monday Wednesday and Friday. We will continue with the collagen under the foam. 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 She has no palpable bone Electronic Signature(s) Signed: 01/01/2016 5:25:36 PM By: [name] Najjar MD Entered By: [name] on 01/01/2016 16:57:40 [place], [name] ([id number]) -------------------------------------------------------------------------------- SuperBill Details Patient Name: Solivan, [name] [name]. Date of  Service: 01/01/2016 [name] Record Patient Account Number: [id number] Number: Treating RN: [name] Mealy, RN, [name], [name] 15-Aug-1935 [phone] y.o. Other Clinician: Date of Birth/Sex: Female) Treating Donnald Tabar Primary Care Physician/Extender: [name] Physician: [name] in Treatment: 11 Referring Physician: Thedore [name], [name] Diagnosis Coding ICD-10 Codes Code Description L89.154 Pressure ulcer of sacral region,  stage 4 L03.317 Cellulitis of buttock E43 Unspecified severe protein-calorie malnutrition L89.213 Pressure ulcer of right hip, stage 3 Facility Procedures CPT4 Code: 16109604 Description: 99213 - WOUND CARE VISIT-LEV 3 EST PT Modifier: Quantity: 1 Physician Procedures CPT4 Code: 5409811 Description: 99213 - WC PHYS LEVEL 3 - EST PT ICD-10 Description Diagnosis L89.154 Pressure ulcer of sacral region, stage 4 Modifier: Quantity: 1 Electronic Signature(s) Signed: 01/01/2016 5:25:36 PM By: [name] Najjar MD Entered By: [name] on 01/01/2016 16:58:07

## 2016-01-02 NOTE — Progress Notes (Signed)
[name] ([id number]) Visit Report for 01/01/2016 Arrival Information Details Patient Name: [name], [name] Date of Service: 01/01/2016 3:45 PM Medical Record Patient Account Number: [id number] Number: Treating RN: [name], RN, [name], [name] July 09, 1936 [phone] y.o. Other Clinician: Date of Birth/Sex: Female) Treating ROBSON, [name] Primary Care Physician: [name] Physician/Extender: G Referring Physician: [name] Weeks in Treatment: 11 Visit Information History Since Last Visit Added or deleted any medications: No Patient Arrived: Wheel Chair Any new allergies or adverse reactions: No Arrival Time: 15:58 Signs or symptoms of abuse/neglect since last No Accompanied By: dtr/grddtr visito Transfer Assistance: Easy[employer] Hospitalized since last visit: No Patient Lift Has Dressing in Place as Prescribed: Yes Patient Identification Verified: Yes Pain Present Now: No Secondary Verification Process Yes Completed: Patient Requires Transmission- No Based Precautions: Patient Has Alerts: No Electronic Signature(s) Signed: 01/01/2016 5:20:16 PM By: Elpidio Eric [name], RN Entered By: Elpidio Eric on 01/01/2016 15:59:19 [name] ([id number]) -------------------------------------------------------------------------------- Clinic Level of Care Assessment Details Patient Name: [name], [name] Date of Service: 01/01/2016 3:45 PM Medical Record Patient Account Number: [id number] Number: Treating RN: [name], RN, [name], [name] Jul 15, 1936 [phone] y.o. Other Clinician: Date of Birth/Sex: Female) Treating ROBSON, [name] Primary Care Physician: [name] Physician/Extender: G Referring Physician: Thedore Mins, [name] Weeks in Treatment: 11 Clinic Level of Care Assessment Items TOOL 4 Quantity Score  - Use when only an EandM is performed on FOLLOW-UP visit 0 ASSESSMENTS - Nursing Assessment / Reassessment X - Reassessment of Co-morbidities (includes  updates in patient status) 1 10 X - Reassessment of Adherence to Treatment Plan 1 5 ASSESSMENTS - Wound and Skin Assessment / Reassessment X - Simple Wound Assessment / Reassessment - one wound 1 5  - Complex Wound Assessment / Reassessment - multiple wounds 0  - Dermatologic / Skin Assessment (not related to wound area) 0 ASSESSMENTS - Focused Assessment  - Circumferential Edema Measurements - multi extremities 0  - Nutritional Assessment / Counseling / Intervention 0  - Lower Extremity Assessment (monofilament, tuning fork, pulses) 0  - Peripheral Arterial Disease Assessment (using hand held doppler) 0 ASSESSMENTS - Ostomy and/or Continence Assessment and Care  - Incontinence Assessment and Management 0  - Ostomy Care Assessment and Management (repouching, etc.) 0 PROCESS - Coordination of Care X - Simple Patient / Family Education for ongoing care 1 15  - Complex (extensive) Patient / Family Education for ongoing care 0  - Staff obtains Chiropractor, Records, Test Results / Process Orders 0  - Staff telephones HHA, Nursing Homes / Clarify orders / etc 0 [name] ([id number])  - Routine Transfer to another Facility (non-emergent condition) 0  - Routine Hospital Admission (non-emergent condition) 0  - New Admissions / Manufacturing engineer / Ordering NPWT, Apligraf, etc. 0  - Emergency Hospital Admission (emergent condition) 0  - Simple Discharge Coordination 0  - Complex (extensive) Discharge Coordination 0 PROCESS - Special Needs  - Pediatric / Minor Patient Management 0  - Isolation Patient Management 0  - Hearing / Language / Visual special needs 0  - Assessment of Community assistance (transportation, D/C planning, etc.) 0  - Additional assistance / Altered mentation 0  - Support Surface(s) Assessment (bed, cushion, seat, etc.) 0 INTERVENTIONS - Wound Cleansing / Measurement  - Simple Wound Cleansing - one wound 0 X -  Complex Wound Cleansing - multiple wounds 2 5 X - Wound Imaging (photographs - any number of wounds) 1 5  - Wound Tracing (instead of photographs) 0  -  Simple Wound Measurement - one wound 0 X - Complex Wound Measurement - multiple wounds 2 5 INTERVENTIONS - Wound Dressings X - Small Wound Dressing one or multiple wounds 2 10 []  - Medium Wound Dressing one or multiple wounds 0 []  - Large Wound Dressing one or multiple wounds 0 []  - Application of Medications - topical 0 []  - Application of Medications - injection 0 [name], [name] ([id number]) INTERVENTIONS - Miscellaneous []  - External ear exam 0 []  - Specimen Collection (cultures, biopsies, blood, body fluids, etc.) 0 []  - Specimen(s) / Culture(s) sent or taken to Lab for analysis 0 []  - Patient Transfer (multiple staff / Michiel Sites Lift / Similar devices) 0 []  - Simple Staple / Suture removal (25 or less) 0 []  - Complex Staple / Suture removal (26 or more) 0 []  - Hypo / Hyperglycemic Management (close monitor of Blood Glucose) 0 []  - Ankle / Brachial Index (ABI) - do not check if billed separately 0 X - Vital Signs 1 5 Has the patient been seen at the hospital within the last three years: Yes Total Score: 85 Level Of Care: New/Established - Level 3 Electronic Signature(s) Signed: 01/01/2016 5:20:16 PM By: Elpidio Eric [name], RN Entered By: Elpidio Eric on 01/01/2016 16:30:58 [name] ([id number]) -------------------------------------------------------------------------------- Encounter Discharge Information Details Patient Name: [name], [name] Date of Service: 01/01/2016 3:45 PM Medical Record Patient Account Number: [id number] Number: Treating RN: [name], RN, [name], [name] Nov 21, 1935 [phone] y.o. Other Clinician: Date of Birth/Sex: Female) Treating ROBSON, [name] Primary Care Physician: [name] Physician/Extender: G Referring Physician: [name] Weeks in Treatment: 11 Encounter Discharge  Information Items Discharge Pain Level: 0 Discharge Condition: Stable Ambulatory Status: Wheelchair Discharge Destination: Home Private Transportation: Auto Accompanied By: dtr/grddtr Schedule Follow-up Appointment: No Medication Reconciliation completed and No provided to Patient/Care Sana Tessmer: Clinical Summary of Care: Electronic Signature(s) Signed: 01/01/2016 5:20:16 PM By: Elpidio Eric [name], RN Entered By: Elpidio Eric on 01/01/2016 16:52:25 Buczek, Rojelio [name] ([id number]) -------------------------------------------------------------------------------- Multi Wound Chart Details Patient Name: Cutter, [name] Date of Service: 01/01/2016 3:45 PM Medical Record Patient Account Number: [id number] Number: Treating RN: [name], RN, [name], [name] 01-Jan-1936 [phone] y.o. Other Clinician: Date of Birth/Sex: Female) Treating ROBSON, [name] Primary Care Physician: [name] Physician/Extender: G Referring Physician: Thedore Mins, [name] Weeks in Treatment: 11 Vital Signs Height(in): 63 Pulse(bpm): 88 Weight(lbs): Blood Pressure 118/61 (mmHg): Body Mass Index(BMI): Temperature(F): 98.4 Respiratory Rate 16 (breaths/min): Photos: [1:No Photos] [2:No Photos] [N/A:N/A] Wound Location: [1:Sacrum] [2:Right Ischial Tuberosity N/A] Wounding Event: [1:Gradually Appeared] [2:Gradually Appeared] [N/A:N/A] Primary Etiology: [1:Pressure Ulcer] [2:Pressure Ulcer] [N/A:N/A] Comorbid History: [1:Cataracts, Anemia, Arrhythmia, Hypertension, Arrhythmia, Hypertension, Myocardial Infarction, History of pressure wounds, Osteoarthritis, Dementia] [2:Cataracts, Anemia, Myocardial Infarction, History of pressure wounds,  Osteoarthritis, Dementia] [N/A:N/A] Date Acquired: [1:09/10/2015] [2:09/10/2015] [N/A:N/A] Weeks of Treatment: [1:11] [2:11] [N/A:N/A] Wound Status: [1:Open] [2:Open] [N/A:N/A] Measurements L x W x D 3x1.8x1 [2:0.1x0.1x0.1] [N/A:N/A] (cm) Area (cm) : [1:4.241] [2:0.008]  [N/A:N/A] Volume (cm) : [1:4.241] [2:0.001] [N/A:N/A] % Reduction in Area: [1:71.90%] [2:99.00%] [N/A:N/A] % Reduction in Volume: 93.80% [2:98.70%] [N/A:N/A] Position 1 (o'clock): 1 Maximum Distance 1 2.5 (cm): Tunneling: [1:Yes] [2:No] [N/A:N/A] Classification: [1:Category/Stage IV] [2:Category/Stage II] [N/A:N/A] Exudate Amount: [1:Large] [2:None Present] [N/A:N/A] Exudate Type: [1:Serosanguineous] [2:N/A] [N/A:N/A] Exudate Color: [1:red, brown Yes] [2:N/A No] [N/A:N/A N/A] Foul Odor After Cleansing: Odor Anticipated Due to No N/A N/A Product Use: Wound Margin: Thickened Distinct, outline attached N/A Granulation Amount: Large (67-100%) Large (67-100%) N/A Granulation Quality: Red Pink N/A Necrotic Amount: Small (1-33%) None  Present (0%) N/A Exposed Structures: Fascia: No Fascia: No N/A Fat: No Fat: No Tendon: No Tendon: No Muscle: No Muscle: No Joint: No Joint: No Bone: No Bone: No Limited to Skin Limited to Skin Breakdown Breakdown Epithelialization: None Large (67-100%) N/A Periwound Skin Texture: Edema: No Edema: No N/A Excoriation: No Excoriation: No Induration: No Induration: No Callus: No Callus: No Crepitus: No Crepitus: No Fluctuance: No Fluctuance: No Friable: No Friable: No Rash: No Rash: No Scarring: No Scarring: No Periwound Skin Moist: Yes Moist: Yes N/A Moisture: Maceration: No Maceration: No Dry/Scaly: No Dry/Scaly: No Periwound Skin Color: Atrophie Blanche: No Atrophie Blanche: No N/A Cyanosis: No Cyanosis: No Ecchymosis: No Ecchymosis: No Erythema: No Erythema: No Hemosiderin Staining: No Hemosiderin Staining: No Mottled: No Mottled: No Pallor: No Pallor: No Rubor: No Rubor: No Temperature: No Abnormality No Abnormality N/A Tenderness on Yes Yes N/A Palpation: Wound Preparation: Ulcer Cleansing: Ulcer Cleansing: N/A Rinsed/Irrigated with Rinsed/Irrigated with Saline Saline Topical Anesthetic Topical  Anesthetic Applied: Other: lidocaine Applied: None 4% Treatment Notes Madia, SYBIL SHRADER ([id number]) Electronic Signature(s) Signed: 01/01/2016 5:20:16 PM By: Elpidio Eric [name], RN Entered By: Elpidio Eric on 01/01/2016 16:27:08 Rauber, Rojelio [name] ([id number]) -------------------------------------------------------------------------------- Multi-Disciplinary Care Plan Details Patient Name: Travieso, [name] Date of Service: 01/01/2016 3:45 PM Medical Record Patient Account Number: [id number] Number: Treating RN: [name], RN, [name], [name] Jul 21, 1936 [phone] y.o. Other Clinician: Date of Birth/Sex: Female) Treating ROBSON, [name] Primary Care Physician: [name] Physician/Extender: G Referring Physician: Thedore Mins, [name] Weeks in Treatment: 11 Active Inactive Abuse / Safety / Falls / Self Care Management Nursing Diagnoses: Impaired home maintenance Impaired physical mobility Knowledge deficit related to: safety; personal, health (wound), emergency Potential for falls Self care deficit: actual or potential Goals: Patient will remain injury free Date Initiated: 10/15/2015 Goal Status: Active Patient/caregiver will verbalize understanding of skin care regimen Date Initiated: 10/15/2015 Goal Status: Active Patient/caregiver will verbalize/demonstrate measure taken to improve self care Date Initiated: 10/15/2015 Goal Status: Active Patient/caregiver will verbalize/demonstrate measures taken to improve the patient's personal safety Date Initiated: 10/15/2015 Goal Status: Active Patient/caregiver will verbalize/demonstrate measures taken to prevent injury and/or falls Date Initiated: 10/15/2015 Goal Status: Active Patient/caregiver will verbalize/demonstrate understanding of what to do in case of emergency Date Initiated: 10/15/2015 Goal Status: Active Interventions: Assess fall risk on admission and as needed Assess: immobility, friction, shearing, incontinence upon admission  and as needed Assess impairment of mobility on admission and as needed per policy Caponi, [name] ([id number]) Assess self care needs on admission and as needed Provide education on basic hygiene Provide education on fall prevention Provide education on personal and home safety Provide education on safe transfers Treatment Activities: Education provided on Basic Hygiene : 10/15/2015 Patient referred to home care : 11/20/2015 Notes: Nutrition Nursing Diagnoses: Imbalanced nutrition Impaired glucose control: actual or potential Potential for alteratiion in Nutrition/Potential for imbalanced nutrition Goals: Patient/caregiver agrees to and verbalizes understanding of need to obtain nutritional consultation Date Initiated: 10/15/2015 Goal Status: Active Patient/caregiver agrees to and verbalizes understanding of need to use nutritional supplements and/or vitamins as prescribed Date Initiated: 10/15/2015 Goal Status: Active Patient/caregiver verbalizes understanding of need to maintain therapeutic glucose control per primary care physician Date Initiated: 10/15/2015 Goal Status: Active Interventions: Assess patient nutrition upon admission and as needed per policy Provide education on elevated blood sugars and impact on wound healing Provide education on nutrition Treatment Activities: Education provided on Nutrition : 12/04/2015 Notes: Orientation to the Wound Care Program Nursing Diagnoses: Knowledge deficit related to the  wound healing center program Guandique, [name] ([id number]) Goals: Patient/caregiver will verbalize understanding of the Wound Healing Center Program Date Initiated: 10/15/2015 Goal Status: Active Interventions: Provide education on orientation to the wound center Notes: Pressure Nursing Diagnoses: Knowledge deficit related to causes and risk factors for pressure ulcer development Knowledge deficit related to management of pressures ulcers Potential for  impaired tissue integrity related to pressure, friction, moisture, and shear Goals: Patient will remain free from development of additional pressure ulcers Date Initiated: 10/15/2015 Goal Status: Active Patient will remain free of pressure ulcers Date Initiated: 10/15/2015 Goal Status: Active Patient/caregiver will verbalize risk factors for pressure ulcer development Date Initiated: 10/15/2015 Goal Status: Active Patient/caregiver will verbalize understanding of pressure ulcer management Date Initiated: 10/15/2015 Goal Status: Active Interventions: Assess: immobility, friction, shearing, incontinence upon admission and as needed Assess offloading mechanisms upon admission and as needed Assess potential for pressure ulcer upon admission and as needed Provide education on pressure ulcers Treatment Activities: Patient referred for home evaluation of offloading devices/mattresses : 11/20/2015 Patient referred for pressure reduction/relief devices : 11/20/2015 Patient referred for seating evaluation to ensure proper offloading : 11/20/2015 Pressure reduction/relief device ordered : 11/20/2015 Notes: Landini, Rojelio [name] ([id number]) Wound/Skin Impairment Nursing Diagnoses: Impaired tissue integrity Knowledge deficit related to smoking impact on wound healing Knowledge deficit related to ulceration/compromised skin integrity Goals: Patient/caregiver will verbalize understanding of skin care regimen Date Initiated: 10/15/2015 Goal Status: Active Ulcer/skin breakdown will have a volume reduction of 30% by week 4 Date Initiated: 10/15/2015 Goal Status: Active Ulcer/skin breakdown will have a volume reduction of 50% by week 8 Date Initiated: 10/15/2015 Goal Status: Active Ulcer/skin breakdown will have a volume reduction of 80% by week 12 Date Initiated: 10/15/2015 Goal Status: Active Ulcer/skin breakdown will heal within 14 weeks Date Initiated: 10/15/2015 Goal Status:  Active Interventions: Assess patient/caregiver ability to obtain necessary supplies Assess patient/caregiver ability to perform ulcer/skin care regimen upon admission and as needed Assess ulceration(s) every visit Provide education on ulcer and skin care Treatment Activities: Patient referred to home care : 11/20/2015 Skin care regimen initiated : 11/20/2015 Notes: Electronic Signature(s) Signed: 01/01/2016 5:20:16 PM By: Elpidio Eric [name], RN Entered By: Elpidio Eric on 01/01/2016 16:26:55 Dimartino, Rojelio [name] ([id number]) -------------------------------------------------------------------------------- Pain Assessment Details Patient Name: Magloire, [name] Date of Service: 01/01/2016 3:45 PM Medical Record Patient Account Number: [id number] Number: Treating RN: [name], RN, [name], [name] 1935/12/31 [phone] y.o. Other Clinician: Date of Birth/Sex: Female) Treating ROBSON, [name] Primary Care Physician: [name] Physician/Extender: G Referring Physician: Thedore Mins, [name] Weeks in Treatment: 11 Active Problems Location of Pain Severity and Description of Pain Patient Has Paino Patient Unable to Respond Site Locations Pain Management and Medication Current Pain Management: Electronic Signature(s) Signed: 01/01/2016 5:20:16 PM By: Elpidio Eric [name], RN Entered By: Elpidio Eric on 01/01/2016 15:59:29 Howser, Rojelio [name] ([id number]) -------------------------------------------------------------------------------- Patient/Caregiver Education Details Patient Name: Grill, [name] Date of Service: 01/01/2016 3:45 PM Medical Record Patient Account Number: [id number] Number: Treating RN: [name], RN, [name], [name] [birth year] [phone] y.o. Other Clinician: Date of Birth/Gender: Female) Treating ROBSON, [name] Primary Care Physician: [name] Physician/Extender: G Referring Physician: [name] Weeks in Treatment: 11 Education Assessment Education Provided  To: Patient Education Topics Provided Basic Hygiene: Methods: Explain/Verbal Responses: State content correctly Elevated Blood Sugar/ Impact on Healing: Methods: Explain/Verbal Responses: State content correctly Nutrition: Methods: Explain/Verbal Responses: State content correctly Pressure: Methods: Explain/Verbal Responses: State content correctly Safety: Methods: Explain/Verbal Responses: State content correctly Welcome To The Wound [facility] Center: Methods:  Explain/Verbal Responses: State content correctly Wound/Skin Impairment: Methods: Explain/Verbal Responses: State content correctly Electronic Signature(s) Signed: 01/01/2016 5:20:16 PM By: Elpidio EricAfful, [name], RN Pettitt, [name] ([id number]) Entered By: Elpidio EricAfful, [name] on 01/01/2016 16:52:53 Perlman, [name] ([id number]) -------------------------------------------------------------------------------- Wound Assessment Details Patient Name: Sneath, [name] Date of Service: 01/01/2016 3:45 PM Medical Record Patient Account Number: [id number] Number: Treating RN: Clover MealyAfful, RN, [name], [name] 1935-11-02 [phone](80 y.o. Other Clinician: Date of Birth/Sex: Female) Treating ROBSON, [name] Primary Care Physician: Leotis ShamesSINGH, [name] Physician/Extender: G Referring Physician: Thedore MinsSINGH, [name] Weeks in Treatment: 11 Wound Status Wound Number: 1 Primary Pressure Ulcer Etiology: Wound Location: Sacrum Wound Open Wounding Event: Gradually Appeared Status: Date Acquired: 09/10/2015 Comorbid Cataracts, Anemia, Arrhythmia, Weeks Of Treatment: 11 History: Hypertension, Myocardial Infarction, Clustered Wound: No History of pressure wounds, Osteoarthritis, Dementia Photos Photo Uploaded By: Elpidio EricAfful, [name] on 01/01/2016 17:10:01 Wound Measurements Length: (cm) 3 Width: (cm) 1.8 Depth: (cm) 1 Area: (cm) 4.241 Volume: (cm) 4.241 % Reduction in Area: 71.9% % Reduction in Volume: 93.8% Epithelialization: None Tunneling: Yes Position  (o'clock): 1 Maximum Distance: (cm) 2.5 Undermining: No Wound Description Classification: Category/Stage IV Foul Odor Aft Wound Margin: Thickened Due to Produc Exudate Amount: Large Exudate Type: Serosanguineous Exudate Color: red, brown Randol, Marien B. ([id number]) er Cleansing: Yes t Use: No Wound Bed Granulation Amount: Large (67-100%) Exposed Structure Granulation Quality: Red Fascia Exposed: No Necrotic Amount: Small (1-33%) Fat Layer Exposed: No Necrotic Quality: Adherent Slough Tendon Exposed: No Muscle Exposed: No Joint Exposed: No Bone Exposed: No Limited to Skin Breakdown Periwound Skin Texture Texture Color No Abnormalities Noted: No No Abnormalities Noted: No Callus: No Atrophie Blanche: No Crepitus: No Cyanosis: No Excoriation: No Ecchymosis: No Fluctuance: No Erythema: No Friable: No Hemosiderin Staining: No Induration: No Mottled: No Localized Edema: No Pallor: No Rash: No Rubor: No Scarring: No Temperature / Pain Moisture Temperature: No Abnormality No Abnormalities Noted: No Tenderness on Palpation: Yes Dry / Scaly: No Maceration: No Moist: Yes Wound Preparation Ulcer Cleansing: Rinsed/Irrigated with Saline Topical Anesthetic Applied: Other: lidocaine 4%, Treatment Notes Wound #1 (Sacrum) 1. Cleansed with: Clean wound with Normal Saline 3. Peri-wound Care: Skin Prep 4. Dressing Applied: Prisma Ag 8. Negative Pressure Wound Therapy Wound Vac to wound continuously at 14525mm/hg pressure White Foam Notes 1 black foam applied to wound, 1 used as a bridge. Guagliardo, [name] ([id number]) Electronic Signature(s) Signed: 01/01/2016 4:13:33 PM By: Elpidio EricAfful, [name], RN Entered By: Elpidio EricAfful, [name] on 01/01/2016 16:13:33 Espiritu, [name] ([id number]) -------------------------------------------------------------------------------- Wound Assessment Details Patient Name: Mckiernan, [name] Date of Service: 01/01/2016 3:45 PM Medical Record  Patient Account Number: [id number] Number: Treating RN: Clover MealyAfful, RN, [name], [name] 1935-11-02 [phone](80 y.o. Other Clinician: Date of Birth/Sex: Female) Treating ROBSON, [name] Primary Care Physician: Leotis ShamesSINGH, [name] Physician/Extender: G Referring Physician: Thedore MinsSINGH, [name] Weeks in Treatment: 11 Wound Status Wound Number: 2 Primary Pressure Ulcer Etiology: Wound Location: Right Ischial Tuberosity Wound Healed - Epithelialized Wounding Event: Gradually Appeared Status: Date Acquired: 09/10/2015 Comorbid Cataracts, Anemia, Arrhythmia, Weeks Of Treatment: 11 History: Hypertension, Myocardial Infarction, Clustered Wound: No History of pressure wounds, Osteoarthritis, Dementia Photos Photo Uploaded By: Elpidio EricAfful, [name] on 01/01/2016 17:10:01 Wound Measurements Length: (cm) 0 % Reductio Width: (cm) 0 % Reductio Depth: (cm) 0 Epithelial Area: (cm) 0 Tunneling Volume: (cm) 0 Undermini n in Area: 100% n in Volume: 100% ization: Large (67-100%) : No ng: No Wound Description Classification: Category/Stage II Wound Margin: Distinct, outline attached Exudate Amount: None Present Foul Odor After Cleansing: No Wound Bed Granulation  Amount: None Present (0%) Exposed Structure Necrotic Amount: None Present (0%) Fascia Exposed: No Fat Layer Exposed: No Posas, [name] ([id number]) Tendon Exposed: No Muscle Exposed: No Joint Exposed: No Bone Exposed: No Limited to Skin Breakdown Periwound Skin Texture Texture Color No Abnormalities Noted: No No Abnormalities Noted: No Callus: No Atrophie Blanche: No Crepitus: No Cyanosis: No Excoriation: No Ecchymosis: No Fluctuance: No Erythema: No Friable: No Hemosiderin Staining: No Induration: No Mottled: No Localized Edema: No Pallor: No Rash: No Rubor: No Scarring: No Temperature / Pain Moisture Temperature: No Abnormality No Abnormalities Noted: No Dry / Scaly: Yes Maceration: No Moist: No Wound Preparation Ulcer  Cleansing: Rinsed/Irrigated with Saline Topical Anesthetic Applied: None Electronic Signature(s) Signed: 01/01/2016 5:20:16 PM By: Elpidio Eric [name], RN Entered By: Elpidio Eric on 01/01/2016 16:47:03 Wieland, Rojelio [name] ([id number]) -------------------------------------------------------------------------------- Vitals Details Patient Name: Klecka, [name] Date of Service: 01/01/2016 3:45 PM Medical Record Patient Account Number: [id number] Number: Treating RN: [name], RN, [name], [name] 01-16-1936 [phone] y.o. Other Clinician: Date of Birth/Sex: Female) Treating ROBSON, [name] Primary Care Physician: [name] Physician/Extender: G Referring Physician: Thedore Mins, [name] Weeks in Treatment: 11 Vital Signs Time Taken: 16:05 Temperature (F): 98.4 Height (in): 63 Pulse (bpm): 88 Respiratory Rate (breaths/min): 16 Blood Pressure (mmHg): 118/61 Reference Range: 80 - 120 mg / dl Electronic Signature(s) Signed: 01/01/2016 5:20:16 PM By: Elpidio Eric [name], RN Entered By: Elpidio Eric on 01/01/2016 16:07:46

## 2016-01-03 DIAGNOSIS — L89150 Pressure ulcer of sacral region, unstageable: Secondary | ICD-10-CM | POA: Diagnosis not present

## 2016-01-03 DIAGNOSIS — L89620 Pressure ulcer of left heel, unstageable: Secondary | ICD-10-CM | POA: Diagnosis not present

## 2016-01-03 DIAGNOSIS — L89210 Pressure ulcer of right hip, unstageable: Secondary | ICD-10-CM | POA: Diagnosis not present

## 2016-01-03 DIAGNOSIS — F0280 Dementia in other diseases classified elsewhere without behavioral disturbance: Secondary | ICD-10-CM | POA: Diagnosis not present

## 2016-01-03 DIAGNOSIS — I10 Essential (primary) hypertension: Secondary | ICD-10-CM | POA: Diagnosis not present

## 2016-01-03 DIAGNOSIS — R000 Tachycardia, unspecified: Secondary | ICD-10-CM | POA: Diagnosis not present

## 2016-01-03 DIAGNOSIS — E785 Hyperlipidemia, unspecified: Secondary | ICD-10-CM | POA: Diagnosis not present

## 2016-01-03 DIAGNOSIS — G309 Alzheimer's disease, unspecified: Secondary | ICD-10-CM | POA: Diagnosis not present

## 2016-01-06 DIAGNOSIS — E785 Hyperlipidemia, unspecified: Secondary | ICD-10-CM | POA: Diagnosis not present

## 2016-01-06 DIAGNOSIS — T8189XA Other complications of procedures, not elsewhere classified, initial encounter: Secondary | ICD-10-CM | POA: Diagnosis not present

## 2016-01-06 DIAGNOSIS — L89154 Pressure ulcer of sacral region, stage 4: Secondary | ICD-10-CM | POA: Diagnosis not present

## 2016-01-06 DIAGNOSIS — R32 Unspecified urinary incontinence: Secondary | ICD-10-CM | POA: Diagnosis not present

## 2016-01-06 DIAGNOSIS — L89210 Pressure ulcer of right hip, unstageable: Secondary | ICD-10-CM | POA: Diagnosis not present

## 2016-01-06 DIAGNOSIS — R000 Tachycardia, unspecified: Secondary | ICD-10-CM | POA: Diagnosis not present

## 2016-01-06 DIAGNOSIS — L89620 Pressure ulcer of left heel, unstageable: Secondary | ICD-10-CM | POA: Diagnosis not present

## 2016-01-06 DIAGNOSIS — F0280 Dementia in other diseases classified elsewhere without behavioral disturbance: Secondary | ICD-10-CM | POA: Diagnosis not present

## 2016-01-06 DIAGNOSIS — I10 Essential (primary) hypertension: Secondary | ICD-10-CM | POA: Diagnosis not present

## 2016-01-06 DIAGNOSIS — L89159 Pressure ulcer of sacral region, unspecified stage: Secondary | ICD-10-CM | POA: Diagnosis not present

## 2016-01-06 DIAGNOSIS — G309 Alzheimer's disease, unspecified: Secondary | ICD-10-CM | POA: Diagnosis not present

## 2016-01-06 DIAGNOSIS — G300 Alzheimer's disease with early onset: Secondary | ICD-10-CM | POA: Diagnosis not present

## 2016-01-06 DIAGNOSIS — L89150 Pressure ulcer of sacral region, unstageable: Secondary | ICD-10-CM | POA: Diagnosis not present

## 2016-01-08 DIAGNOSIS — G309 Alzheimer's disease, unspecified: Secondary | ICD-10-CM | POA: Diagnosis not present

## 2016-01-08 DIAGNOSIS — E785 Hyperlipidemia, unspecified: Secondary | ICD-10-CM | POA: Diagnosis not present

## 2016-01-08 DIAGNOSIS — L89620 Pressure ulcer of left heel, unstageable: Secondary | ICD-10-CM | POA: Diagnosis not present

## 2016-01-08 DIAGNOSIS — L89210 Pressure ulcer of right hip, unstageable: Secondary | ICD-10-CM | POA: Diagnosis not present

## 2016-01-08 DIAGNOSIS — I10 Essential (primary) hypertension: Secondary | ICD-10-CM | POA: Diagnosis not present

## 2016-01-08 DIAGNOSIS — L89150 Pressure ulcer of sacral region, unstageable: Secondary | ICD-10-CM | POA: Diagnosis not present

## 2016-01-08 DIAGNOSIS — F0280 Dementia in other diseases classified elsewhere without behavioral disturbance: Secondary | ICD-10-CM | POA: Diagnosis not present

## 2016-01-08 DIAGNOSIS — R000 Tachycardia, unspecified: Secondary | ICD-10-CM | POA: Diagnosis not present

## 2016-01-10 DIAGNOSIS — I10 Essential (primary) hypertension: Secondary | ICD-10-CM | POA: Diagnosis not present

## 2016-01-10 DIAGNOSIS — G309 Alzheimer's disease, unspecified: Secondary | ICD-10-CM | POA: Diagnosis not present

## 2016-01-10 DIAGNOSIS — E785 Hyperlipidemia, unspecified: Secondary | ICD-10-CM | POA: Diagnosis not present

## 2016-01-10 DIAGNOSIS — F0280 Dementia in other diseases classified elsewhere without behavioral disturbance: Secondary | ICD-10-CM | POA: Diagnosis not present

## 2016-01-10 DIAGNOSIS — R000 Tachycardia, unspecified: Secondary | ICD-10-CM | POA: Diagnosis not present

## 2016-01-10 DIAGNOSIS — L89210 Pressure ulcer of right hip, unstageable: Secondary | ICD-10-CM | POA: Diagnosis not present

## 2016-01-10 DIAGNOSIS — L89620 Pressure ulcer of left heel, unstageable: Secondary | ICD-10-CM | POA: Diagnosis not present

## 2016-01-10 DIAGNOSIS — L89150 Pressure ulcer of sacral region, unstageable: Secondary | ICD-10-CM | POA: Diagnosis not present

## 2016-01-13 DIAGNOSIS — F0280 Dementia in other diseases classified elsewhere without behavioral disturbance: Secondary | ICD-10-CM | POA: Diagnosis not present

## 2016-01-13 DIAGNOSIS — L89620 Pressure ulcer of left heel, unstageable: Secondary | ICD-10-CM | POA: Diagnosis not present

## 2016-01-13 DIAGNOSIS — L89210 Pressure ulcer of right hip, unstageable: Secondary | ICD-10-CM | POA: Diagnosis not present

## 2016-01-13 DIAGNOSIS — E785 Hyperlipidemia, unspecified: Secondary | ICD-10-CM | POA: Diagnosis not present

## 2016-01-13 DIAGNOSIS — R000 Tachycardia, unspecified: Secondary | ICD-10-CM | POA: Diagnosis not present

## 2016-01-13 DIAGNOSIS — I10 Essential (primary) hypertension: Secondary | ICD-10-CM | POA: Diagnosis not present

## 2016-01-13 DIAGNOSIS — L89150 Pressure ulcer of sacral region, unstageable: Secondary | ICD-10-CM | POA: Diagnosis not present

## 2016-01-13 DIAGNOSIS — G309 Alzheimer's disease, unspecified: Secondary | ICD-10-CM | POA: Diagnosis not present

## 2016-01-15 ENCOUNTER — Encounter: Payer: Medicare Other | Attending: Internal Medicine | Admitting: Internal Medicine

## 2016-01-15 DIAGNOSIS — I252 Old myocardial infarction: Secondary | ICD-10-CM | POA: Insufficient documentation

## 2016-01-15 DIAGNOSIS — I10 Essential (primary) hypertension: Secondary | ICD-10-CM | POA: Insufficient documentation

## 2016-01-15 DIAGNOSIS — F0280 Dementia in other diseases classified elsewhere without behavioral disturbance: Secondary | ICD-10-CM | POA: Diagnosis not present

## 2016-01-15 DIAGNOSIS — M1990 Unspecified osteoarthritis, unspecified site: Secondary | ICD-10-CM | POA: Diagnosis not present

## 2016-01-15 DIAGNOSIS — Z87891 Personal history of nicotine dependence: Secondary | ICD-10-CM | POA: Insufficient documentation

## 2016-01-15 DIAGNOSIS — Z880 Allergy status to penicillin: Secondary | ICD-10-CM | POA: Insufficient documentation

## 2016-01-15 DIAGNOSIS — G309 Alzheimer's disease, unspecified: Secondary | ICD-10-CM | POA: Insufficient documentation

## 2016-01-15 DIAGNOSIS — L89153 Pressure ulcer of sacral region, stage 3: Secondary | ICD-10-CM | POA: Diagnosis not present

## 2016-01-15 DIAGNOSIS — L89213 Pressure ulcer of right hip, stage 3: Secondary | ICD-10-CM | POA: Insufficient documentation

## 2016-01-15 DIAGNOSIS — D649 Anemia, unspecified: Secondary | ICD-10-CM | POA: Insufficient documentation

## 2016-01-15 DIAGNOSIS — L89154 Pressure ulcer of sacral region, stage 4: Secondary | ICD-10-CM | POA: Diagnosis not present

## 2016-01-15 DIAGNOSIS — E43 Unspecified severe protein-calorie malnutrition: Secondary | ICD-10-CM | POA: Diagnosis not present

## 2016-01-15 DIAGNOSIS — L03317 Cellulitis of buttock: Secondary | ICD-10-CM | POA: Insufficient documentation

## 2016-01-17 DIAGNOSIS — L89210 Pressure ulcer of right hip, unstageable: Secondary | ICD-10-CM | POA: Diagnosis not present

## 2016-01-17 DIAGNOSIS — G309 Alzheimer's disease, unspecified: Secondary | ICD-10-CM | POA: Diagnosis not present

## 2016-01-17 DIAGNOSIS — L89150 Pressure ulcer of sacral region, unstageable: Secondary | ICD-10-CM | POA: Diagnosis not present

## 2016-01-17 DIAGNOSIS — F0280 Dementia in other diseases classified elsewhere without behavioral disturbance: Secondary | ICD-10-CM | POA: Diagnosis not present

## 2016-01-17 DIAGNOSIS — E785 Hyperlipidemia, unspecified: Secondary | ICD-10-CM | POA: Diagnosis not present

## 2016-01-17 DIAGNOSIS — R000 Tachycardia, unspecified: Secondary | ICD-10-CM | POA: Diagnosis not present

## 2016-01-17 DIAGNOSIS — I10 Essential (primary) hypertension: Secondary | ICD-10-CM | POA: Diagnosis not present

## 2016-01-17 DIAGNOSIS — L89620 Pressure ulcer of left heel, unstageable: Secondary | ICD-10-CM | POA: Diagnosis not present

## 2016-01-17 DIAGNOSIS — L89159 Pressure ulcer of sacral region, unspecified stage: Secondary | ICD-10-CM | POA: Diagnosis not present

## 2016-01-20 DIAGNOSIS — I10 Essential (primary) hypertension: Secondary | ICD-10-CM | POA: Diagnosis not present

## 2016-01-20 DIAGNOSIS — L89620 Pressure ulcer of left heel, unstageable: Secondary | ICD-10-CM | POA: Diagnosis not present

## 2016-01-20 DIAGNOSIS — G309 Alzheimer's disease, unspecified: Secondary | ICD-10-CM | POA: Diagnosis not present

## 2016-01-20 DIAGNOSIS — F0280 Dementia in other diseases classified elsewhere without behavioral disturbance: Secondary | ICD-10-CM | POA: Diagnosis not present

## 2016-01-20 DIAGNOSIS — E785 Hyperlipidemia, unspecified: Secondary | ICD-10-CM | POA: Diagnosis not present

## 2016-01-20 DIAGNOSIS — L89150 Pressure ulcer of sacral region, unstageable: Secondary | ICD-10-CM | POA: Diagnosis not present

## 2016-01-20 DIAGNOSIS — R000 Tachycardia, unspecified: Secondary | ICD-10-CM | POA: Diagnosis not present

## 2016-01-20 DIAGNOSIS — L89210 Pressure ulcer of right hip, unstageable: Secondary | ICD-10-CM | POA: Diagnosis not present

## 2016-01-21 DIAGNOSIS — L89150 Pressure ulcer of sacral region, unstageable: Secondary | ICD-10-CM | POA: Diagnosis not present

## 2016-01-21 DIAGNOSIS — L89154 Pressure ulcer of sacral region, stage 4: Secondary | ICD-10-CM | POA: Diagnosis not present

## 2016-01-22 DIAGNOSIS — L89150 Pressure ulcer of sacral region, unstageable: Secondary | ICD-10-CM | POA: Diagnosis not present

## 2016-01-22 DIAGNOSIS — G309 Alzheimer's disease, unspecified: Secondary | ICD-10-CM | POA: Diagnosis not present

## 2016-01-22 DIAGNOSIS — L89210 Pressure ulcer of right hip, unstageable: Secondary | ICD-10-CM | POA: Diagnosis not present

## 2016-01-22 DIAGNOSIS — R000 Tachycardia, unspecified: Secondary | ICD-10-CM | POA: Diagnosis not present

## 2016-01-22 DIAGNOSIS — E785 Hyperlipidemia, unspecified: Secondary | ICD-10-CM | POA: Diagnosis not present

## 2016-01-22 DIAGNOSIS — F0280 Dementia in other diseases classified elsewhere without behavioral disturbance: Secondary | ICD-10-CM | POA: Diagnosis not present

## 2016-01-22 DIAGNOSIS — I10 Essential (primary) hypertension: Secondary | ICD-10-CM | POA: Diagnosis not present

## 2016-01-22 DIAGNOSIS — L89620 Pressure ulcer of left heel, unstageable: Secondary | ICD-10-CM | POA: Diagnosis not present

## 2016-01-24 DIAGNOSIS — F0280 Dementia in other diseases classified elsewhere without behavioral disturbance: Secondary | ICD-10-CM | POA: Diagnosis not present

## 2016-01-24 DIAGNOSIS — E785 Hyperlipidemia, unspecified: Secondary | ICD-10-CM | POA: Diagnosis not present

## 2016-01-24 DIAGNOSIS — G309 Alzheimer's disease, unspecified: Secondary | ICD-10-CM | POA: Diagnosis not present

## 2016-01-24 DIAGNOSIS — R000 Tachycardia, unspecified: Secondary | ICD-10-CM | POA: Diagnosis not present

## 2016-01-24 DIAGNOSIS — L89620 Pressure ulcer of left heel, unstageable: Secondary | ICD-10-CM | POA: Diagnosis not present

## 2016-01-24 DIAGNOSIS — L89150 Pressure ulcer of sacral region, unstageable: Secondary | ICD-10-CM | POA: Diagnosis not present

## 2016-01-24 DIAGNOSIS — L89210 Pressure ulcer of right hip, unstageable: Secondary | ICD-10-CM | POA: Diagnosis not present

## 2016-01-24 DIAGNOSIS — I10 Essential (primary) hypertension: Secondary | ICD-10-CM | POA: Diagnosis not present

## 2016-01-27 DIAGNOSIS — L89150 Pressure ulcer of sacral region, unstageable: Secondary | ICD-10-CM | POA: Diagnosis not present

## 2016-01-27 DIAGNOSIS — L89210 Pressure ulcer of right hip, unstageable: Secondary | ICD-10-CM | POA: Diagnosis not present

## 2016-01-27 DIAGNOSIS — R000 Tachycardia, unspecified: Secondary | ICD-10-CM | POA: Diagnosis not present

## 2016-01-27 DIAGNOSIS — I10 Essential (primary) hypertension: Secondary | ICD-10-CM | POA: Diagnosis not present

## 2016-01-27 DIAGNOSIS — E785 Hyperlipidemia, unspecified: Secondary | ICD-10-CM | POA: Diagnosis not present

## 2016-01-27 DIAGNOSIS — L89620 Pressure ulcer of left heel, unstageable: Secondary | ICD-10-CM | POA: Diagnosis not present

## 2016-01-27 DIAGNOSIS — G309 Alzheimer's disease, unspecified: Secondary | ICD-10-CM | POA: Diagnosis not present

## 2016-01-27 DIAGNOSIS — F0280 Dementia in other diseases classified elsewhere without behavioral disturbance: Secondary | ICD-10-CM | POA: Diagnosis not present

## 2016-01-29 ENCOUNTER — Encounter: Payer: Medicare Other | Admitting: Internal Medicine

## 2016-01-29 DIAGNOSIS — Z87891 Personal history of nicotine dependence: Secondary | ICD-10-CM | POA: Diagnosis not present

## 2016-01-29 DIAGNOSIS — L89154 Pressure ulcer of sacral region, stage 4: Secondary | ICD-10-CM | POA: Diagnosis not present

## 2016-01-29 DIAGNOSIS — I10 Essential (primary) hypertension: Secondary | ICD-10-CM | POA: Diagnosis not present

## 2016-01-29 DIAGNOSIS — M1990 Unspecified osteoarthritis, unspecified site: Secondary | ICD-10-CM | POA: Diagnosis not present

## 2016-01-29 DIAGNOSIS — I252 Old myocardial infarction: Secondary | ICD-10-CM | POA: Diagnosis not present

## 2016-01-29 DIAGNOSIS — L03317 Cellulitis of buttock: Secondary | ICD-10-CM | POA: Diagnosis not present

## 2016-01-29 DIAGNOSIS — D649 Anemia, unspecified: Secondary | ICD-10-CM | POA: Diagnosis not present

## 2016-01-29 DIAGNOSIS — G309 Alzheimer's disease, unspecified: Secondary | ICD-10-CM | POA: Diagnosis not present

## 2016-01-29 DIAGNOSIS — Z880 Allergy status to penicillin: Secondary | ICD-10-CM | POA: Diagnosis not present

## 2016-01-29 DIAGNOSIS — L89213 Pressure ulcer of right hip, stage 3: Secondary | ICD-10-CM | POA: Diagnosis not present

## 2016-01-29 DIAGNOSIS — E43 Unspecified severe protein-calorie malnutrition: Secondary | ICD-10-CM | POA: Diagnosis not present

## 2016-01-30 NOTE — Progress Notes (Addendum)
Sandall, [name]. ([id number]) Visit Report for 01/29/2016 Chief Complaint Document Details Patient Name: [name]. Date of Service: 01/29/2016 3:45 PM Medical Record Patient Account Number: [id number] Number: Treating RN: Clover MealyAfful, RN, [name], [name] 21-May-1936 [phone](80 y.o. Other Clinician: Date of Birth/Sex: Female) Treating Ferne Ellingwood Primary Care Physician/Extender: Marlana LatusG SINGH, [name] Physician: Referring Physician: Leotis ShamesSINGH, [name] Weeks in Treatment: 15 Information Obtained from: Patient Chief Complaint The patient is here for a sacral decubitus ulcer also a more superficial wound over her right hip Electronic Signature(s) Signed: 01/29/2016 4:24:59 PM By: Baltazar Najjarobson, Lundon Verdejo MD Entered By: Baltazar Najjarobson, Naren Benally on 01/29/2016 16:18:27 [name], [name]. ([id number]) -------------------------------------------------------------------------------- HPI Details Patient Name: [name]. Date of Service: 01/29/2016 3:45 PM Medical Record Patient Account Number: [id number] Number: Treating RN: Clover MealyAfful, RN, [name], [name] 21-May-1936 [phone](80 y.o. Other Clinician: Date of Birth/Sex: Female) Treating Katrina Daddona Primary Care Physician/Extender: Marlana LatusG SINGH, [name] Physician: Referring Physician: Leotis ShamesSINGH, [name] Weeks in Treatment: 15 History of Present Illness HPI Description: 10/15/15; this is a very frail lady with advanced Alzheimer's disease who is being cared for aerobically at home by her daughter and granddaughter. She is essentially nonambulatory and seems to be minimally verbal. They tell me that she started to develop a pressure area in her sacral area in January. Initially a very small open area that progressed up. There is also a more recent area on the right hip. The patient was seen in her primary office for this problem on 2/23 at that point noted to have a stage III wound with tunneling over the sacrum. There were 2 other areas noted on the upper left and right  bilateral occipital. As well as a stage II on the right trochanter. She was referred to the ER because of tachycardia. She was hospitalized from 2/23 through 2/25. The admission this seems to open dominated by a distal fecal impaction. She was noted to have sinus tachycardia which improved. She did have a CT scan of the abdomen and pelvis during the hospitalization. In addition to the fecal impaction this noted the ulcer in the left lower gluteal region adjacent to the midline. There was air in the subcutaneous wound but no evidence of an air-fluid level to suggest an abscess. There was no comment about osteomyelitis. Since her return home she has not been eating well but is drinking. The family provides total care. I note that her albumin was 3 on admission to the hospital. Sodium got as high as 148 but was normalized by the time she left 10/24/15; this is a patient who has advanced dementia. She has a deep probing stage IV pressure ulcer over her lower sacral area. Cultures of this last week grew both Proteus and methicillin-resistant staph aureus. I and empirically put her on Cipro and doxycycline which should cover both of these. He also has a wound over her right greater trochanter. She is been followed by home health at home they're requesting lab work which I think is reasonable. I ordered a CMP, CBC with differential, sedimentation rate and a C-reactive protein 10/30/15 the patient's labs were done by home health but I think they faxed the orders to the primary physician's office. The plain x-ray I did did not show osteomyelitis however it did show fractures of the right superior and inferior pubic rami which are still not fully healed. I informed the patient's family of this. They do give a history of a fall while she was at an assisted living in [place] in November afterward  she had some trouble walking this may have been the issue. 11/13/15 the wound VAC only started 6 days ago. She is  apparently eating well 11/20/15; the patient continues with the wound VAC without any difficulties. This is being changed by home health. She is apparently eating well 12/04/15; the patient continues with a wound VAC without any difficulties. They're using collagen under the foam being changed by [facility]. The area over the right greater trochanter is also continued to improve she is eating well and undergoing therapeutic positioning by her granddaughter with regularity 12/18/15; the patient arrives with her daughter and granddaughter the latter of which is the primary caregiver. She continues with the wound VAC with collagen under the foam. She is apparently eating well and they are religiously offloading this area [name], [name]. ([id number]) 01/01/16; this is a patient I follow every 2 weeks. She has severe dementia and developed a stage IV pressure ulcer over her lower sacrum and coccyx. In spite of the fact that her Alzheimer's disease is advanced, we have had excellent results using collagen under the foam of wound VAC. She has also had meticulous care for predominantly a granddaughter at home. 01/15/16; I follow this lady every 2 weeks for what was a central stage IV pressure wound over her lower sacrum and coccyx. In spite of advanced Alzheimer's disease in mobility and probably some degree of protein calorie malnutrition she is actually had an excellent result to a wound VAC with silver collagen under the VAC foam. She continues to do well the wound is a lot smaller. The tunneling that I was concerned about last week has resolved although there is still undermining superiorly from roughly 10 to 2:00 6/2 28/17; I follow this lady every 2 weeks for what was a stage IV pressure wound over her lower sacrum and coccyx. In spite of advanced Alzheimer's disease, relatively immobile state she has done exceptionally well with a wound VAC care. She still has undermining from 10 to 2:00  however the base of this is healthy and the orifice is really too small now to continue wound VAC. Electronic Signature(s) Signed: 01/29/2016 4:24:59 PM By: Baltazar Najjarobson, Ameirah Khatoon MD Entered By: Baltazar Najjarobson, Kore Madlock on 01/29/2016 16:19:29 Geise, [name]. ([id number]) -------------------------------------------------------------------------------- Physical Exam Details Patient Name: [name], [name]. Date of Service: 01/29/2016 3:45 PM Medical Record Patient Account Number: [id number] Number: Treating RN: Clover MealyAfful, RN, [name], [name] 11-Jan-1936 [phone](80 y.o. Other Clinician: Date of Birth/Sex: Female) Treating Rannie Craney Primary Care Physician/Extender: Marlana LatusG SINGH, [name] Physician: Referring Physician: Leotis ShamesSINGH, [name] Weeks in Treatment: 15 Constitutional Patient is hypertensive.. Pulse regular and within target range for patient.Marland Kitchen. Respirations regular, non-labored and within target range.. Temperature is normal and within the target range for the patient.. Patient with severe dementia. Eyes Conjunctivae clear. No discharge.Marland Kitchen. Respiratory Respiratory effort is easy and symmetric bilaterally. Rate is normal at rest and on room air.. Bilateral breath sounds are clear and equal in all lobes with no wheezes, rales or rhonchi.. Cardiovascular Heart rhythm and rate regular, without murmur or gallop. Patient appears to be well-hydrated. Gastrointestinal (GI) Abdomen is soft and non-distended without masses or tenderness. Bowel sounds active in all quadrants.. No liver or spleen enlargement or tenderness.. Lymphatic Nonpalpable cervical clavicular areas. Psychiatric Advanced dementia. Notes Wound exam; the large tunneling 4 weeks ago has turned into an area of undermining from roughly 10 to 2:00. The granulation appears to be healthy there is no evidence of infection or subcutaneous crepitus. Electronic Signature(s) Signed: 01/29/2016  4:24:59 PM By: Baltazar Najjar MD Entered By: Baltazar Najjar  on 01/29/2016 16:21:22 Whitt, [name] ([id number]) -------------------------------------------------------------------------------- Physician Orders Details Patient Name: Sturges, [name] [name]. Date of Service: 01/29/2016 3:45 PM Medical Record Patient Account Number: [id number] Number: Treating RN: [name], RN, [name], [name] 1936/06/24 [phone] y.o. Other Clinician: Date of Birth/Sex: Female) Treating Yulia Ulrich Primary Care Physician/Extender: [name], [name] Physician: Referring Physician: [name] Weeks in Treatment: 15 Verbal / Phone Orders: Yes Clinician: [name], RN, [name], [name] Read Back and Verified: Yes Diagnosis Coding Wound Cleansing Wound #1 Sacrum o Clean wound with Normal Saline. Skin Barriers/Peri-Wound Care Wound #1 Sacrum o Barrier cream Primary Wound Dressing Wound #1 Sacrum o Prisma Ag Secondary Dressing Wound #1 Sacrum o Boardered Foam Dressing Dressing Change Frequency Wound #1 Sacrum o Other: - Twice a week Follow-up Appointments Wound #1 Sacrum o Return Appointment in 2 weeks. Off-Loading Wound #1 Sacrum o Turn and reposition every 2 hours o [facility] bed in place Additional Orders / Instructions Wound #1 Sacrum o Increase protein intake. [name], [name] ([id number]) o Other: - Supplements: Vitamin C, MVI, Zinc Home Health Wound #1 Sacrum o Continue [facility] Visits - [facility] o Home Health Nurse may visit PRN to address patientos wound care needs. o FACE TO FACE ENCOUNTER: MEDICARE and MEDICAID PATIENTS: I certify that this patient is under my care and that I had a face-to-face encounter that meets the physician face-to-face encounter requirements with this patient on this date. The encounter with the patient was in whole or in part for the following MEDICAL CONDITION: (primary reason for Home Healthcare) MEDICAL NECESSITY: I certify, that based on my findings, NURSING services are  a medically necessary home health service. HOME BOUND STATUS: I certify that my clinical findings support that this patient is homebound (i.e., Due to illness or injury, pt requires aid of supportive devices such as crutches, cane, wheelchairs, walkers, the use of special transportation or the assistance of another person to leave their place of residence. There is a normal inability to leave the home and doing so requires considerable and taxing effort. Other absences are for medical reasons / religious services and are infrequent or of short duration when for other reasons). o If current dressing causes regression in wound condition, may D/C ordered dressing product/s and apply Normal Saline Moist Dressing daily until next Wound Healing Center / Other MD appointment. Notify Wound Healing Center of regression in wound condition at [phone]. o Please direct any NON-WOUND related issues/requests for orders to patient's Primary Care Physician Negative Pressure Wound Therapy Wound #1 Sacrum o Discontinue NPWT. Electronic Signature(s) Signed: 02/03/2016 4:27:28 PM By: Elpidio Eric [name], RN Signed: 02/06/2016 7:33:08 AM By: Baltazar Najjar MD Previous Signature: 01/29/2016 4:24:59 PM Version By: Baltazar Najjar MD Previous Signature: 01/29/2016 5:43:53 PM Version By: Elpidio Eric [name], RN Entered By: Elpidio Eric on 02/03/2016 16:27:06 Deeney, [name] ([id number]) -------------------------------------------------------------------------------- Problem List Details Patient Name: [name], [name]. Date of Service: 01/29/2016 3:45 PM Medical Record Patient Account Number: [id number] Number: Treating RN: [name], RN, [name], [name] 1936-05-22 [phone] y.o. Other Clinician: Date of Birth/Sex: Female) Treating Preslynn Bier Primary Care Physician/Extender: [name], [name] Physician: Referring Physician: [name] Weeks in Treatment: 15 Active Problems ICD-10 Encounter Code  Description Active Date Diagnosis L89.154 Pressure ulcer of sacral region, stage 4 10/15/2015 Yes E43 Unspecified severe protein-calorie malnutrition 10/15/2015 Yes Inactive Problems Resolved Problems ICD-10 Code Description Active Date Resolved Date L89.213 Pressure ulcer of right  hip, stage 3 10/15/2015 10/15/2015 L03.317 Cellulitis of buttock 10/15/2015 10/15/2015 Electronic Signature(s) Signed: 01/29/2016 4:24:59 PM By: Baltazar Najjar MD Entered By: Baltazar Najjar on 01/29/2016 16:23:55 [name], [name] ([id number]) -------------------------------------------------------------------------------- Progress Note Details Patient Name: [name], [name]. Date of Service: 01/29/2016 3:45 PM Medical Record Patient Account Number: [id number] Number: Treating RN: [name], RN, [name], [name] 1936/08/01 [phone] y.o. Other Clinician: Date of Birth/Sex: Female) Treating Rayshon Albaugh Primary Care Physician/Extender: [name], [name] Physician: Referring Physician: [name] Weeks in Treatment: 15 Subjective Chief Complaint Information obtained from Patient The patient is here for a sacral decubitus ulcer also a more superficial wound over her right hip History of Present Illness (HPI) 10/15/15; this is a very frail lady with advanced Alzheimer's disease who is being cared for aerobically at home by her daughter and granddaughter. She is essentially nonambulatory and seems to be minimally verbal. They tell me that she started to develop a pressure area in her sacral area in January. Initially a very small open area that progressed up. There is also a more recent area on the right hip. The patient was seen in her primary office for this problem on 2/23 at that point noted to have a stage III wound with tunneling over the sacrum. There were 2 other areas noted on the upper left and right bilateral occipital. As well as a stage II on the right trochanter. She was referred to the ER because  of tachycardia. She was hospitalized from 2/23 through 2/25. The admission this seems to open dominated by a distal fecal impaction. She was noted to have sinus tachycardia which improved. She did have a CT scan of the abdomen and pelvis during the hospitalization. In addition to the fecal impaction this noted the ulcer in the left lower gluteal region adjacent to the midline. There was air in the subcutaneous wound but no evidence of an air-fluid level to suggest an abscess. There was no comment about osteomyelitis. Since her return home she has not been eating well but is drinking. The family provides total care. I note that her albumin was 3 on admission to the hospital. Sodium got as high as 148 but was normalized by the time she left 10/24/15; this is a patient who has advanced dementia. She has a deep probing stage IV pressure ulcer over her lower sacral area. Cultures of this last week grew both Proteus and methicillin-resistant staph aureus. I and empirically put her on Cipro and doxycycline which should cover both of these. He also has a wound over her right greater trochanter. She is been followed by home health at home they're requesting lab work which I think is reasonable. I ordered a CMP, CBC with differential, sedimentation rate and a C-reactive protein 10/30/15 the patient's labs were done by home health but I think they faxed the orders to the primary physician's office. The plain x-ray I did did not show osteomyelitis however it did show fractures of the right superior and inferior pubic rami which are still not fully healed. I informed the patient's family of this. They do give a history of a fall while she was at an assisted living in [place] in November afterward she had some trouble walking this may have been the issue. 11/13/15 the wound VAC only started 6 days ago. She is apparently eating well 11/20/15; the patient continues with the wound VAC without any difficulties. This is  being changed by home health. She is apparently eating well [name], [name]. (  [id number]) 12/04/15; the patient continues with a wound VAC without any difficulties. They're using collagen under the foam being changed by [facility]. The area over the right greater trochanter is also continued to improve she is eating well and undergoing therapeutic positioning by her granddaughter with regularity 12/18/15; the patient arrives with her daughter and granddaughter the latter of which is the primary caregiver. She continues with the wound VAC with collagen under the foam. She is apparently eating well and they are religiously offloading this area 01/01/16; this is a patient I follow every 2 weeks. She has severe dementia and developed a stage IV pressure ulcer over her lower sacrum and coccyx. In spite of the fact that her Alzheimer's disease is advanced, we have had excellent results using collagen under the foam of wound VAC. She has also had meticulous care for predominantly a granddaughter at home. 01/15/16; I follow this lady every 2 weeks for what was a central stage IV pressure wound over her lower sacrum and coccyx. In spite of advanced Alzheimer's disease in mobility and probably some degree of protein calorie malnutrition she is actually had an excellent result to a wound VAC with silver collagen under the VAC foam. She continues to do well the wound is a lot smaller. The tunneling that I was concerned about last week has resolved although there is still undermining superiorly from roughly 10 to 2:00 6/2 28/17; I follow this lady every 2 weeks for what was a stage IV pressure wound over her lower sacrum and coccyx. In spite of advanced Alzheimer's disease, relatively immobile state she has done exceptionally well with a wound VAC care. She still has undermining from 10 to 2:00 however the base of this is healthy and the orifice is really too small now to continue wound  VAC. Objective Constitutional Patient is hypertensive.. Pulse regular and within target range for patient.Marland Kitchen Respirations regular, non-labored and within target range.. Temperature is normal and within the target range for the patient.. Patient with severe dementia. Vitals Time Taken: 3:58 PM, Height: 63 in, Temperature: 98 F, Pulse: 71 bpm, Respiratory Rate: 16 breaths/min, Blood Pressure: 124/68 mmHg. Eyes Conjunctivae clear. No discharge.Marland Kitchen Respiratory Respiratory effort is easy and symmetric bilaterally. Rate is normal at rest and on room air.. Bilateral breath sounds are clear and equal in all lobes with no wheezes, rales or rhonchi.. Cardiovascular Heart rhythm and rate regular, without murmur or gallop. Patient appears to be well-hydrated. Gastrointestinal (GI) Abdomen is soft and non-distended without masses or tenderness. Bowel sounds active in all quadrants.. No liver or spleen enlargement or tenderness.Marland Kitchen [name], [name]. ([id number]) Lymphatic Nonpalpable cervical clavicular areas. Psychiatric Advanced dementia. General Notes: Wound exam; the large tunneling 4 weeks ago has turned into an area of undermining from roughly 10 to 2:00. The granulation appears to be healthy there is no evidence of infection or subcutaneous crepitus. Integumentary (Hair, Skin) Wound #1 status is Open. Original cause of wound was Gradually Appeared. The wound is located on the Sacrum. The wound measures 1.5cm length x 1.2cm width x 0.5cm depth; 1.414cm^2 area and 0.707cm^3 volume. The wound is limited to skin breakdown. There is no tunneling or undermining noted. There is a large amount of serosanguineous drainage noted. The wound margin is thickened. There is large (67- 100%) red granulation within the wound bed. There is a small (1-33%) amount of necrotic tissue within the wound bed including Adherent Slough. The periwound skin appearance exhibited: Moist. The periwound skin appearance did  not exhibit: Callus, Crepitus, Excoriation, Fluctuance, Friable, Induration, Localized Edema, Rash, Scarring, Dry/Scaly, Maceration, Atrophie Blanche, Cyanosis, Ecchymosis, Hemosiderin Staining, Mottled, Pallor, Rubor, Erythema. Periwound temperature was noted as No Abnormality. The periwound has tenderness on palpation. Assessment Active Problems ICD-10 L89.154 - Pressure ulcer of sacral region, stage 4 L03.317 - Cellulitis of buttock E43 - Unspecified severe protein-calorie malnutrition L89.213 - Pressure ulcer of right hip, stage 3 Plan Wound Cleansing: Wound #1 Sacrum: Clean wound with Normal Saline. Skin Barriers/Peri-Wound Care: Wound #1 Sacrum: Barrier cream Primary Wound Dressing: [name], [name]. ([id number]) Wound #1 Sacrum: Prisma Ag Secondary Dressing: Wound #1 Sacrum: Boardered Foam Dressing Dressing Change Frequency: Wound #1 Sacrum: Change Dressing Monday, Wednesday, Friday Follow-up Appointments: Wound #1 Sacrum: Return Appointment in 2 weeks. Off-Loading: Wound #1 Sacrum: Turn and reposition every 2 hours [facility] bed [facility] place Additional Orders / Instructions: Wound #1 Sacrum: Increase protein intake. Other: - Supplements: Vitamin C, MVI, Zinc Home Health: Wound #1 Sacrum: Continue [facility] Visits - [facility] Home Health Nurse may visit PRN to address patient s wound care needs. FACE TO FACE ENCOUNTER: MEDICARE and MEDICAID PATIENTS: I certify that this patient is under my care and that I had a face-to-face encounter that meets the physician face-to-face encounter requirements with this patient on this date. The encounter with the patient was in whole or in part for the following MEDICAL CONDITION: (primary reason for Home Healthcare) MEDICAL NECESSITY: I certify, that based on my findings, NURSING services are a medically necessary home health service. HOME BOUND STATUS: I certify that my clinical findings support that this  patient is homebound (i.e., Due to illness or injury, pt requires aid of supportive devices such as crutches, cane, wheelchairs, walkers, the use of special transportation or the assistance of another person to leave their place of residence. There is a normal inability to leave the home and doing so requires considerable and taxing effort. Other absences are for medical reasons / religious services and are infrequent or of short duration when for other reasons). If current dressing causes regression in wound condition, may D/C ordered dressing product/s and apply Normal Saline Moist Dressing daily until next Wound Healing Center / Other MD appointment. Notify Wound Healing Center of regression in wound condition at [phone]. Please direct any NON-WOUND related issues/requests for orders to patient's Primary Care Physician Negative Pressure Wound Therapy: Wound #1 Sacrum: Discontinue NPWT. #1 we have changed her to collagen hydrogel and a foam-based dressing. Change every second day #2 we'll see her in 2 weeks. Next choice would be probably [facility] or [facility] Ehmann, LAURREN LEPKOWSKI ([id number]) Electronic Signature(s) Signed: 01/29/2016 4:24:59 PM By: Baltazar Najjar MD Entered By: Baltazar Najjar on 01/29/2016 16:22:30 Ose, [name] ([id number]) -------------------------------------------------------------------------------- SuperBill Details Patient Name: Ullom, [name] [name]. Date of Service: 01/29/2016 Medical Record Patient Account Number: [id number] Number: Treating RN: [name], RN, [name], [name] 06-09-1936 [phone] y.o. Other Clinician: Date of Birth/Sex: Female) Treating Damita Eppard Primary Care Physician/Extender: [name], [name] Physician: Weeks in Treatment: 15 Referring Physician: [facility], [name] Diagnosis Coding ICD-10 Codes Code Description L89.154 Pressure ulcer of sacral region, stage 4 L03.317 Cellulitis of buttock E43 Unspecified severe protein-calorie  malnutrition L89.213 Pressure ulcer of right hip, stage 3 Facility Procedures CPT4 Code: [id number] Description: 40102 - WOUND CARE VISIT-LEV 2 EST PT Modifier: Quantity: 1 Physician Procedures CPT4 Code: [id number] Description: 99213 - WC PHYS LEVEL 3 - EST PT ICD-10 Description Diagnosis L89.154 Pressure ulcer of sacral region, stage 4  Modifier: Quantity: 1 Electronic Signature(s) Signed: 01/29/2016 4:24:59 PM By: Baltazar Najjar MD Entered By: Baltazar Najjar on 01/29/2016 16:23:12

## 2016-01-30 NOTE — Progress Notes (Signed)
[name], [name] ([id number]) Visit Report for 01/29/2016 Arrival Information Details Patient Name: [name], [name] Date of Service: 01/29/2016 3:45 PM Medical Record Patient Account Number: [id number] Number: Treating [name]: [name] Oct 08, 1935 [phone] y.o. Other Clinician: Date of Birth/Sex: Female) Treating [name] Primary Care Physician: [name] Physician/Extender: G Referring Physician: Leotis [name] Weeks in Treatment: 15 Visit Information History Since Last Visit Added or deleted any medications: No Patient Arrived: Wheel Chair Any new allergies or adverse reactions: No Arrival Time: 15:55 Had a fall or experienced change in No Accompanied By: dtr,grnd dtr activities of daily living that may affect Transfer Assistance: EasyPivot risk of falls: Patient Lift Signs or symptoms of abuse/neglect since last No Patient Identification Verified: Yes visito Secondary Verification Process Yes Hospitalized since last visit: No Completed: Has Dressing in Place as Prescribed: Yes Patient Requires Transmission- No Pain Present Now: No Based Precautions: Patient Has Alerts: No Electronic Signature(s) Signed: 01/29/2016 5:43:53 PM By: [name] Entered By: [name] Eric on 01/29/2016 15:56:02 [name], [name] ([id number]) -------------------------------------------------------------------------------- Clinic Level of Care Assessment Details Patient Name: [name], [name] Date of Service: 01/29/2016 3:45 PM Medical Record Patient Account Number: [id number] Number: Treating [name]: [name] June 30, 1936 [phone] y.o. Other Clinician: Date of Birth/Sex: Female) Treating [name] Primary Care Physician: [name] Physician/Extender: G Referring Physician: Thedore Mins, [name] Weeks in Treatment: 15 Clinic Level of Care Assessment Items TOOL 4 Quantity Score []  - Use when only an EandM is performed on FOLLOW-UP visit  0 ASSESSMENTS - Nursing Assessment / Reassessment X - Reassessment of Co-morbidities (includes updates in patient status) 1 10 X - Reassessment of Adherence to Treatment Plan 1 5 ASSESSMENTS - Wound and Skin Assessment / Reassessment X - Simple Wound Assessment / Reassessment - one wound 1 5 []  - Complex Wound Assessment / Reassessment - multiple wounds 0 []  - Dermatologic / Skin Assessment (not related to wound area) 0 ASSESSMENTS - Focused Assessment []  - Circumferential Edema Measurements - multi extremities 0 []  - Nutritional Assessment / Counseling / Intervention 0 []  - Lower Extremity Assessment (monofilament, tuning fork, pulses) 0 []  - Peripheral Arterial Disease Assessment (using hand held doppler) 0 ASSESSMENTS - Ostomy and/or Continence Assessment and Care []  - Incontinence Assessment and Management 0 []  - Ostomy Care Assessment and Management (repouching, etc.) 0 PROCESS - Coordination of Care X - Simple Patient / Family Education for ongoing care 1 15 []  - Complex (extensive) Patient / Family Education for ongoing care 0 []  - Staff obtains Chiropractor, Records, Test Results / Process Orders 0 []  - Staff telephones HHA, Nursing Homes / Clarify orders / etc 0 [name], [name] ([id number]) []  - Routine Transfer to another Facility (non-emergent condition) 0 []  - Routine Hospital Admission (non-emergent condition) 0 []  - New Admissions / Manufacturing engineer / Ordering NPWT, Apligraf, etc. 0 []  - Emergency Hospital Admission (emergent condition) 0 []  - Simple Discharge Coordination 0 []  - Complex (extensive) Discharge Coordination 0 PROCESS - Special Needs []  - Pediatric / Minor Patient Management 0 []  - Isolation Patient Management 0 []  - Hearing / Language / Visual special needs 0 []  - Assessment of Community assistance (transportation, D/C planning, etc.) 0 []  - Additional assistance / Altered mentation 0 []  - Support Surface(s) Assessment (bed, cushion, seat, etc.)  0 INTERVENTIONS - Wound Cleansing / Measurement X - Simple Wound Cleansing - one wound 1 5 []  - Complex Wound Cleansing - multiple wounds 0 X - Wound  Imaging (photographs - any number of wounds) 1 5 []  - Wound Tracing (instead of photographs) 0 X - Simple Wound Measurement - one wound 1 5 []  - Complex Wound Measurement - multiple wounds 0 INTERVENTIONS - Wound Dressings X - Small Wound Dressing one or multiple wounds 1 10 []  - Medium Wound Dressing one or multiple wounds 0 []  - Large Wound Dressing one or multiple wounds 0 []  - Application of Medications - topical 0 []  - Application of Medications - injection 0 [name], [name] ([id number]) INTERVENTIONS - Miscellaneous []  - External ear exam 0 []  - Specimen Collection (cultures, biopsies, blood, body fluids, etc.) 0 []  - Specimen(s) / Culture(s) sent or taken to Lab for analysis 0 X - Patient Transfer (multiple staff / Michiel Sites Lift / Similar devices) 1 10 []  - Simple Staple / Suture removal (25 or less) 0 []  - Complex Staple / Suture removal (26 or more) 0 []  - Hypo / Hyperglycemic Management (close monitor of Blood Glucose) 0 []  - Ankle / Brachial Index (ABI) - do not check if billed separately 0 X - Vital Signs 1 5 Has the patient been seen at the hospital within the last three years: Yes Total Score: 75 Level Of Care: New/Established - Level 2 Electronic Signature(s) Signed: 01/29/2016 5:43:53 PM By: [name] Entered By: [name] Eric on 01/29/2016 16:21:50 [name], [name] ([id number]) -------------------------------------------------------------------------------- Encounter Discharge Information Details Patient Name: [name], [name] Date of Service: 01/29/2016 3:45 PM Medical Record Patient Account Number: [id number] Number: Treating [name]: [name] [birth year] [phone] y.o. Other Clinician: Date of Birth/Sex: Female) Treating [name] Primary Care Physician: [name] Physician/Extender: G Referring Physician: Leotis [name] Weeks in Treatment: 15 Encounter Discharge Information Items Discharge Pain Level: 0 Discharge Condition: Stable Ambulatory Status: Wheelchair Discharge Destination: Home Transportation: Private Auto Accompanied By: dtr, grd dtr Schedule Follow-up Appointment: No Medication Reconciliation completed and provided to Patient/Care No Satine Hausner: Provided on Clinical Summary of Care: 01/29/2016 Form Type Recipient Paper Patient Ann Maki Electronic Signature(s) Signed: 01/29/2016 4:28:17 PM By: Gwenlyn Perking Entered By: Gwenlyn Perking on 01/29/2016 16:28:17 [name], [name] ([id number]) -------------------------------------------------------------------------------- Multi Wound Chart Details Patient Name: Koziel, [name] Date of Service: 01/29/2016 3:45 PM Medical Record Patient Account Number: [id number] Number: Treating [name]: [name] 1936-02-09 [phone] y.o. Other Clinician: Date of Birth/Sex: Female) Treating [name] Primary Care Physician: [name] Physician/Extender: G Referring Physician: Thedore Mins, [name] Weeks in Treatment: 15 Vital Signs Height(in): 63 Pulse(bpm): 71 Weight(lbs): Blood Pressure 124/68 (mmHg): Body Mass Index(BMI): Temperature(F): 98 Respiratory Rate 16 (breaths/min): Photos: [1:No Photos] [N/A:N/A] Wound Location: [1:Sacrum] [N/A:N/A] Wounding Event: [1:Gradually Appeared] [N/A:N/A] Primary Etiology: [1:Pressure Ulcer] [N/A:N/A] Comorbid History: [1:Cataracts, Anemia, Arrhythmia, Hypertension, Myocardial Infarction, History of pressure wounds, Osteoarthritis, Dementia] [N/A:N/A] Date Acquired: [1:09/10/2015] [N/A:N/A] Weeks of Treatment: [1:15] [N/A:N/A] Wound Status: [1:Open] [N/A:N/A] Measurements L x W x D 1.5x1.2x0.5 [N/A:N/A] (cm) Area (cm) : [1:1.414] [N/A:N/A] Volume (cm) : [1:0.707] [N/A:N/A] % Reduction in Area: [1:90.60%] [N/A:N/A] %  Reduction in Volume: 99.00% [N/A:N/A] Classification: [1:Category/Stage IV] [N/A:N/A] Exudate Amount: [1:Large] [N/A:N/A] Exudate Type: [1:Serosanguineous] [N/A:N/A] Exudate Color: [1:red, brown] [N/A:N/A] Wound Margin: [1:Thickened] [N/A:N/A] Granulation Amount: [1:Large (67-100%)] [N/A:N/A] Granulation Quality: [1:Red] [N/A:N/A] Necrotic Amount: [1:Small (1-33%)] [N/A:N/A] Exposed Structures: [N/A:N/A] Fascia: No Fat: No Tendon: No Muscle: No Joint: No Bone: No Limited to Skin Breakdown Epithelialization: None N/A N/A Periwound Skin Texture: Edema: No N/A N/A Excoriation: No Induration: No Callus: No Crepitus: No Fluctuance: No Friable: No  Rash: No Scarring: No Periwound Skin Moist: Yes N/A N/A Moisture: Maceration: No Dry/Scaly: No Periwound Skin Color: Atrophie Blanche: No N/A N/A Cyanosis: No Ecchymosis: No Erythema: No Hemosiderin Staining: No Mottled: No Pallor: No Rubor: No Temperature: No Abnormality N/A N/A Tenderness on Yes N/A N/A Palpation: Wound Preparation: Ulcer Cleansing: N/A N/A Rinsed/Irrigated with Saline Topical Anesthetic Applied: Other: lidocaine 4% Treatment Notes Electronic Signature(s) Signed: 01/29/2016 5:43:53 PM By: [name] EricAfful, [name] [name], [name] Entered By: [name] EricAfful, [name] on 01/29/2016 16:09:03 Ruotolo, [name] ([id number]) -------------------------------------------------------------------------------- Multi-Disciplinary Care Plan Details Patient Name: Contee, Marylouise StacksWILMA B. Date of Service: 01/29/2016 3:45 PM Medical Record Patient Account Number: [id number] Number: Treating [name]: [name] MealyAfful, [name], [name], [name] 02/18/1936 [phone](80 y.o. Other Clinician: Date of Birth/Sex: Female) Treating [name] Primary Care Physician: [name] ShamesSINGH, [name] Physician/Extender: G Referring Physician: Thedore MinsSINGH, [name] Weeks in Treatment: 15 Active Inactive Abuse / Safety / Falls / Self Care Management Nursing Diagnoses: Impaired home maintenance Impaired  physical mobility Knowledge deficit related to: safety; personal, health (wound), emergency Potential for falls Self care deficit: actual or potential Goals: Patient will remain injury free Date Initiated: 10/15/2015 Goal Status: Active Patient/caregiver will verbalize understanding of skin care regimen Date Initiated: 10/15/2015 Goal Status: Active Patient/caregiver will verbalize/demonstrate measure taken to improve self care Date Initiated: 10/15/2015 Goal Status: Active Patient/caregiver will verbalize/demonstrate measures taken to improve the patient's personal safety Date Initiated: 10/15/2015 Goal Status: Active Patient/caregiver will verbalize/demonstrate measures taken to prevent injury and/or falls Date Initiated: 10/15/2015 Goal Status: Active Patient/caregiver will verbalize/demonstrate understanding of what to do in case of emergency Date Initiated: 10/15/2015 Goal Status: Active Interventions: Assess fall risk on admission and as needed Assess: immobility, friction, shearing, incontinence upon admission and as needed Assess impairment of mobility on admission and as needed per policy Hawks, [name] ([id number]) Assess self care needs on admission and as needed Provide education on basic hygiene Provide education on fall prevention Provide education on personal and home safety Provide education on safe transfers Treatment Activities: Education provided on Basic Hygiene : 01/15/2016 Patient referred to home care : 11/20/2015 Notes: Nutrition Nursing Diagnoses: Imbalanced nutrition Impaired glucose control: actual or potential Potential for alteratiion in Nutrition/Potential for imbalanced nutrition Goals: Patient/caregiver agrees to and verbalizes understanding of need to obtain nutritional consultation Date Initiated: 10/15/2015 Goal Status: Active Patient/caregiver agrees to and verbalizes understanding of need to use nutritional supplements and/or vitamins as  prescribed Date Initiated: 10/15/2015 Goal Status: Active Patient/caregiver verbalizes understanding of need to maintain therapeutic glucose control per primary care physician Date Initiated: 10/15/2015 Goal Status: Active Interventions: Assess patient nutrition upon admission and as needed per policy Provide education on elevated blood sugars and impact on wound healing Provide education on nutrition Treatment Activities: Education provided on Nutrition : 01/15/2016 Notes: Orientation to the Wound Care Program Nursing Diagnoses: Knowledge deficit related to the wound healing center program [name], [name] ([id number]) Goals: Patient/caregiver will verbalize understanding of the Wound Healing Center Program Date Initiated: 10/15/2015 Goal Status: Active Interventions: Provide education on orientation to the wound center Notes: Pressure Nursing Diagnoses: Knowledge deficit related to causes and risk factors for pressure ulcer development Knowledge deficit related to management of pressures ulcers Potential for impaired tissue integrity related to pressure, friction, moisture, and shear Goals: Patient will remain free from development of additional pressure ulcers Date Initiated: 10/15/2015 Goal Status: Active Patient will remain free of pressure ulcers Date Initiated: 10/15/2015 Goal Status: Active Patient/caregiver will verbalize risk factors for pressure ulcer development Date Initiated: 10/15/2015 Goal Status:  Active Patient/caregiver will verbalize understanding of pressure ulcer management Date Initiated: 10/15/2015 Goal Status: Active Interventions: Assess: immobility, friction, shearing, incontinence upon admission and as needed Assess offloading mechanisms upon admission and as needed Assess potential for pressure ulcer upon admission and as needed Provide education on pressure ulcers Treatment Activities: Patient referred for home evaluation of offloading  devices/mattresses : 11/20/2015 Patient referred for pressure reduction/relief devices : 11/20/2015 Patient referred for seating evaluation to ensure proper offloading : 11/20/2015 Pressure reduction/relief device ordered : 11/20/2015 Notes: [name], [name] ([id number]) Wound/Skin Impairment Nursing Diagnoses: Impaired tissue integrity Knowledge deficit related to smoking impact on wound healing Knowledge deficit related to ulceration/compromised skin integrity Goals: Patient/caregiver will verbalize understanding of skin care regimen Date Initiated: 10/15/2015 Goal Status: Active Ulcer/skin breakdown will have a volume reduction of 30% by week 4 Date Initiated: 10/15/2015 Goal Status: Active Ulcer/skin breakdown will have a volume reduction of 50% by week 8 Date Initiated: 10/15/2015 Goal Status: Active Ulcer/skin breakdown will have a volume reduction of 80% by week 12 Date Initiated: 10/15/2015 Goal Status: Active Ulcer/skin breakdown will heal within 14 weeks Date Initiated: 10/15/2015 Goal Status: Active Interventions: Assess patient/caregiver ability to obtain necessary supplies Assess patient/caregiver ability to perform ulcer/skin care regimen upon admission and as needed Assess ulceration(s) every visit Provide education on ulcer and skin care Treatment Activities: Patient referred to home care : 11/20/2015 Skin care regimen initiated : 11/20/2015 Notes: Electronic Signature(s) Signed: 01/29/2016 5:43:53 PM By: [name] Entered By: [name] Eric on 01/29/2016 16:08:54 [name], [name] ([id number]) -------------------------------------------------------------------------------- Pain Assessment Details Patient Name: Hemrick, [name] Date of Service: 01/29/2016 3:45 PM Medical Record Patient Account Number: [id number] Number: Treating [name]: [name] 05/05/1936 [phone] y.o. Other Clinician: Date of Birth/Sex: Female) Treating ROBSON,  [name] Primary Care Physician: [name] Physician/Extender: G Referring Physician: Thedore Mins, [name] Weeks in Treatment: 15 Active Problems Location of Pain Severity and Description of Pain Patient Has Paino No Site Locations With Dressing Change: No Pain Management and Medication Current Pain Management: Electronic Signature(s) Signed: 01/29/2016 5:43:53 PM By: [name] Entered By: [name] Eric on 01/29/2016 15:58:49 [name], [name] ([id number]) -------------------------------------------------------------------------------- Patient/Caregiver Education Details Patient Name: Ende, [name] Date of Service: 01/29/2016 3:45 PM Medical Record Patient Account Number: [id number] Number: Treating [name]: [name] Mar 17, 1936 [phone] y.o. Other Clinician: Date of Birth/Gender: Female) Treating [name] Primary Care Physician: [name] Physician/Extender: G Referring Physician: Leotis [name] Weeks in Treatment: 15 Education Assessment Education Provided To: Caregiver dtr, grnd dtr Education Topics Provided Basic Hygiene: Methods: Explain/Verbal Responses: State content correctly Elevated Blood Sugar/ Impact on Healing: Methods: Explain/Verbal Responses: State content correctly Nutrition: Methods: Explain/Verbal Responses: State content correctly Pressure: Methods: Explain/Verbal Responses: State content correctly Safety: Methods: Explain/Verbal Responses: State content correctly Welcome To The Wound Care Center: Methods: Explain/Verbal Responses: State content correctly Wound/Skin Impairment: Methods: Explain/Verbal Responses: State content correctly Electronic Signature(s) Signed: 01/29/2016 5:43:53 PM By: [name] [place], [name] ([id number]) Entered By: [name] Eric on 01/29/2016 16:23:33 Maron, [name] ([id number]) -------------------------------------------------------------------------------- Wound  Assessment Details Patient Name: Maes, [place] B. Date of Service: 01/29/2016 3:45 PM Medical Record Patient Account Number: [id number] Number: Treating [name]: [name] [birth year] [phone] y.o. Other Clinician: Date of Birth/Sex: Female) Treating [name] Primary Care Physician: [name] Physician/Extender: G Referring Physician: Thedore Mins, [name] Weeks in Treatment: 15 Wound Status Wound Number: 1 Primary Pressure Ulcer Etiology: Wound Location: Sacrum Wound Open Wounding  Event: Gradually Appeared Status: Date Acquired: 09/10/2015 Comorbid Cataracts, Anemia, Arrhythmia, Weeks Of Treatment: 15 History: Hypertension, Myocardial Infarction, Clustered Wound: No History of pressure wounds, Osteoarthritis, Dementia Photos Photo Uploaded By: [name] EricAfful, [name] on 01/29/2016 17:28:40 Wound Measurements Length: (cm) 1.5 Width: (cm) 1.2 Depth: (cm) 0.5 Area: (cm) 1.414 Volume: (cm) 0.707 % Reduction in Area: 90.6% % Reduction in Volume: 99% Epithelialization: None Tunneling: No Undermining: No Wound Description Classification: Category/Stage IV Wound Margin: Thickened Exudate Amount: Large Exudate Type: Serosanguineous Exudate Color: red, brown Foul Odor After Cleansing: No Wound Bed Granulation Amount: Large (67-100%) Exposed Structure Gascoigne, Aamilah B. ([id number]) Granulation Quality: Red Fascia Exposed: No Necrotic Amount: Small (1-33%) Fat Layer Exposed: No Necrotic Quality: Adherent Slough Tendon Exposed: No Muscle Exposed: No Joint Exposed: No Bone Exposed: No Limited to Skin Breakdown Periwound Skin Texture Texture Color No Abnormalities Noted: No No Abnormalities Noted: No Callus: No Atrophie Blanche: No Crepitus: No Cyanosis: No Excoriation: No Ecchymosis: No Fluctuance: No Erythema: No Friable: No Hemosiderin Staining: No Induration: No Mottled: No Localized Edema: No Pallor: No Rash: No Rubor: No Scarring: No  Temperature / Pain Moisture Temperature: No Abnormality No Abnormalities Noted: No Tenderness on Palpation: Yes Dry / Scaly: No Maceration: No Moist: Yes Wound Preparation Ulcer Cleansing: Rinsed/Irrigated with Saline Topical Anesthetic Applied: Other: lidocaine 4%, Treatment Notes Wound #1 (Sacrum) 1. Cleansed with: Clean wound with Normal Saline 3. Peri-wound Care: Skin Prep 4. Dressing Applied: Prisma Ag 5. Secondary Dressing Applied Bordered Foam Dressing Electronic Signature(s) Signed: 01/29/2016 5:43:53 PM By: [name] EricAfful, [name] [name], [name] Entered By: [name] EricAfful, [name] on 01/29/2016 16:03:39 Bigley, [name] ([id number]) -------------------------------------------------------------------------------- Vitals Details Patient Name: Hopf, Marylouise StacksWILMA B. Date of Service: 01/29/2016 3:45 PM Medical Record Patient Account Number: [id number] Number: Treating [name]: [name] MealyAfful, [name], [name], [name] 15-Apr-1936 [phone](80 y.o. Other Clinician: Date of Birth/Sex: Female) Treating [name] Primary Care Physician: [name] ShamesSINGH, [name] Physician/Extender: G Referring Physician: Thedore MinsSINGH, [name] Weeks in Treatment: 15 Vital Signs Time Taken: 15:58 Temperature (F): 98 Height (in): 63 Pulse (bpm): 71 Respiratory Rate (breaths/min): 16 Blood Pressure (mmHg): 124/68 Reference Range: 80 - 120 mg / dl Electronic Signature(s) Signed: 01/29/2016 5:43:53 PM By: [name] EricAfful, [name] [name], [name] Entered By: [name] EricAfful, [name] on 01/29/2016 15:59:37

## 2016-01-31 DIAGNOSIS — L89210 Pressure ulcer of right hip, unstageable: Secondary | ICD-10-CM | POA: Diagnosis not present

## 2016-01-31 DIAGNOSIS — L89150 Pressure ulcer of sacral region, unstageable: Secondary | ICD-10-CM | POA: Diagnosis not present

## 2016-01-31 DIAGNOSIS — R000 Tachycardia, unspecified: Secondary | ICD-10-CM | POA: Diagnosis not present

## 2016-01-31 DIAGNOSIS — E785 Hyperlipidemia, unspecified: Secondary | ICD-10-CM | POA: Diagnosis not present

## 2016-01-31 DIAGNOSIS — L89620 Pressure ulcer of left heel, unstageable: Secondary | ICD-10-CM | POA: Diagnosis not present

## 2016-01-31 DIAGNOSIS — F0280 Dementia in other diseases classified elsewhere without behavioral disturbance: Secondary | ICD-10-CM | POA: Diagnosis not present

## 2016-01-31 DIAGNOSIS — G309 Alzheimer's disease, unspecified: Secondary | ICD-10-CM | POA: Diagnosis not present

## 2016-01-31 DIAGNOSIS — I10 Essential (primary) hypertension: Secondary | ICD-10-CM | POA: Diagnosis not present

## 2016-02-03 DIAGNOSIS — L89150 Pressure ulcer of sacral region, unstageable: Secondary | ICD-10-CM | POA: Diagnosis not present

## 2016-02-03 DIAGNOSIS — F0280 Dementia in other diseases classified elsewhere without behavioral disturbance: Secondary | ICD-10-CM | POA: Diagnosis not present

## 2016-02-03 DIAGNOSIS — L89620 Pressure ulcer of left heel, unstageable: Secondary | ICD-10-CM | POA: Diagnosis not present

## 2016-02-03 DIAGNOSIS — G309 Alzheimer's disease, unspecified: Secondary | ICD-10-CM | POA: Diagnosis not present

## 2016-02-03 DIAGNOSIS — I10 Essential (primary) hypertension: Secondary | ICD-10-CM | POA: Diagnosis not present

## 2016-02-03 DIAGNOSIS — L89210 Pressure ulcer of right hip, unstageable: Secondary | ICD-10-CM | POA: Diagnosis not present

## 2016-02-03 DIAGNOSIS — E785 Hyperlipidemia, unspecified: Secondary | ICD-10-CM | POA: Diagnosis not present

## 2016-02-03 DIAGNOSIS — R000 Tachycardia, unspecified: Secondary | ICD-10-CM | POA: Diagnosis not present

## 2016-02-06 DIAGNOSIS — R000 Tachycardia, unspecified: Secondary | ICD-10-CM | POA: Diagnosis not present

## 2016-02-06 DIAGNOSIS — E785 Hyperlipidemia, unspecified: Secondary | ICD-10-CM | POA: Diagnosis not present

## 2016-02-06 DIAGNOSIS — L89210 Pressure ulcer of right hip, unstageable: Secondary | ICD-10-CM | POA: Diagnosis not present

## 2016-02-06 DIAGNOSIS — G309 Alzheimer's disease, unspecified: Secondary | ICD-10-CM | POA: Diagnosis not present

## 2016-02-06 DIAGNOSIS — I10 Essential (primary) hypertension: Secondary | ICD-10-CM | POA: Diagnosis not present

## 2016-02-06 DIAGNOSIS — L89620 Pressure ulcer of left heel, unstageable: Secondary | ICD-10-CM | POA: Diagnosis not present

## 2016-02-06 DIAGNOSIS — L89150 Pressure ulcer of sacral region, unstageable: Secondary | ICD-10-CM | POA: Diagnosis not present

## 2016-02-06 NOTE — Progress Notes (Signed)
[name] ([id number]) Visit Report for 01/15/2016 Chief Complaint Document Details Patient Name: [name], [name] Date of Service: 01/15/2016 3:45 PM Medical Record Patient Account Number: [id number] Number: Treating RN: [name], RN, [name], [name] 1935-08-19 [phone] y.o. Other Clinician: Date of Birth/Sex: Female) Treating Alida Greiner Primary Care Physician/Extender: [name], [name] Physician: Referring Physician: [name] Weeks in Treatment: 13 Information Obtained from: Patient Chief Complaint The patient is here for a sacral decubitus ulcer also a more superficial wound over her right hip Electronic Signature(s) Signed: 02/06/2016 7:33:08 AM By: Baltazar Najjar MD Entered By: Baltazar Najjar on 01/16/2016 07:37:24 [name], [name] ([id number]) -------------------------------------------------------------------------------- HPI Details Patient Name: [name], [name] Date of Service: 01/15/2016 3:45 PM Medical Record Patient Account Number: [id number] Number: Treating RN: [name], RN, [name], [name] 08-05-1935 [phone] y.o. Other Clinician: Date of Birth/Sex: Female) Treating Lorilei Horan Primary Care Physician/Extender: [name], [name] Physician: Referring Physician: [name] Weeks in Treatment: 13 History of Present Illness HPI Description: 10/15/15; this is a very frail lady with advanced Alzheimer's disease who is being cared for aerobically at home by her daughter and granddaughter. She is essentially nonambulatory and seems to be minimally verbal. They tell me that she started to develop a pressure area in her sacral area in January. Initially a very small open area that progressed up. There is also a more recent area on the right hip. The patient was seen in her primary office for this problem on 2/23 at that point noted to have a stage III wound with tunneling over the sacrum. There were 2 other areas noted on the upper left and right  bilateral occipital. As well as a stage II on the right trochanter. She was referred to the ER because of tachycardia. She was hospitalized from 2/23 through 2/25. The admission this seems to open dominated by a distal fecal impaction. She was noted to have sinus tachycardia which improved. She did have a CT scan of the abdomen and pelvis during the hospitalization. In addition to the fecal impaction this noted the ulcer in the left lower gluteal region adjacent to the midline. There was air in the subcutaneous wound but no evidence of an air-fluid level to suggest an abscess. There was no comment about osteomyelitis. Since her return home she has not been eating well but is drinking. The family provides total care. I note that her albumin was 3 on admission to the hospital. Sodium got as high as 148 but was normalized by the time she left 10/24/15; this is a patient who has advanced dementia. She has a deep probing stage IV pressure ulcer over her lower sacral area. Cultures of this last week grew both Proteus and methicillin-resistant staph aureus. I and empirically put her on Cipro and doxycycline which should cover both of these. He also has a wound over her right greater trochanter. She is been followed by home health at home they're requesting lab work which I think is reasonable. I ordered a CMP, CBC with differential, sedimentation rate and a C-reactive protein 10/30/15 the patient's labs were done by home health but I think they faxed the orders to the primary physician's office. The plain x-ray I did did not show osteomyelitis however it did show fractures of the right superior and inferior pubic rami which are still not fully healed. I informed the patient's family of this. They do give a history of a fall while she was at an assisted living in [place] in November afterward  she had some trouble walking this may have been the issue. 11/13/15 the wound VAC only started 6 days ago. She is  apparently eating well 11/20/15; the patient continues with the wound VAC without any difficulties. This is being changed by home health. She is apparently eating well 12/04/15; the patient continues with a wound VAC without any difficulties. They're using collagen under the foam being changed by [facility]. The area over the right greater trochanter is also continued to improve she is eating well and undergoing therapeutic positioning by her granddaughter with regularity 12/18/15; the patient arrives with her daughter and granddaughter the latter of which is the primary caregiver. She continues with the wound VAC with collagen under the foam. She is apparently eating well and they are religiously offloading this area [name], [name] ([id number]) 01/01/16; this is a patient I follow every 2 weeks. She has severe dementia and developed a stage IV pressure ulcer over her lower sacrum and coccyx. In spite of the fact that her Alzheimer's disease is advanced, we have had excellent results using collagen under the foam of wound VAC. She has also had meticulous care for predominantly a granddaughter at home. 01/15/16; I follow this lady every 2 weeks for what was a central stage IV pressure wound over her lower sacrum and coccyx. In spite of advanced Alzheimer's disease in mobility and probably some degree of protein calorie malnutrition she is actually had an excellent result to a wound VAC with silver collagen under the VAC foam. She continues to do well the wound is a lot smaller. The tunneling that I was concerned about last week has resolved although there is still undermining superiorly from roughly 10 to 2:00 Electronic Signature(s) Signed: 02/06/2016 7:33:08 AM By: Baltazar Najjar MD Entered By: Baltazar Najjar on 01/16/2016 07:37:34 [name], [name] ([id number]) -------------------------------------------------------------------------------- Physical Exam Details Patient Name:  [name], [name] Date of Service: 01/15/2016 3:45 PM Medical Record Patient Account Number: [id number] Number: Treating RN: [name], RN, [name], [name] May 18, 1936 [phone] y.o. Other Clinician: Date of Birth/Sex: Female) Treating Jahmeek Shirk Primary Care Physician/Extender: [name], [name] Physician: Referring Physician: [name] Weeks in Treatment: 13 Notes Wound exam; the tunneling at 12 to 2:00 that was present last time appears to have filled into a area of undermining. The granulation tissue appears healthy here. Small wound which tunnels superiorly. There is no evidence of infection. Electronic Signature(s) Signed: 02/06/2016 7:33:08 AM By: Baltazar Najjar MD Entered By: Baltazar Najjar on 01/16/2016 07:38:11 Weinkauf, [name] ([id number]) -------------------------------------------------------------------------------- Physician Orders Details Patient Name: [name], [name] Date of Service: 01/15/2016 3:45 PM Medical Record Patient Account Number: [id number] Number: Treating RN: [name], RN, [name], [name] [birth year] [phone] y.o. Other Clinician: Date of Birth/Sex: Female) Treating Hargis Vandyne Primary Care Physician/Extender: [name], [name] Physician: Referring Physician: [name] Weeks in Treatment: 20 Verbal / Phone Orders: Yes Clinician: [name], RN, [name], [name] Read Back and Verified: Yes Diagnosis Coding Wound Cleansing Wound #1 Sacrum o Clean wound with Normal Saline. Skin Barriers/Peri-Wound Care Wound #1 Sacrum o Skin Prep Primary Wound Dressing Wound #1 Sacrum o Prisma Ag - use collagen with silver under foam for NPWT pack into 1 O'clock tunnel Dressing Change Frequency Wound #1 Sacrum o Change Dressing Monday, Wednesday, Friday Follow-up Appointments Wound #1 Sacrum o Return Appointment in 2 weeks. Off-Loading Wound #1 Sacrum o Turn and reposition every 2 hours o Mattress - Hospital bed in place Additional Orders  / Instructions Wound #1 Sacrum o Increase  protein intake. o Other: - Supplements: Vitamin C, MVI, Zinc Home Health [name], [name] ([id number]) Wound #1 Sacrum o Continue Home [facility] Visits - [facility] o Home Health Nurse may visit PRN to address patientos wound care needs. o FACE TO FACE ENCOUNTER: MEDICARE and MEDICAID PATIENTS: I certify that this patient is under my care and that I had a face-to-face encounter that meets the physician face-to-face encounter requirements with this patient on this date. The encounter with the patient was in whole or in part for the following MEDICAL CONDITION: (primary reason for Home Healthcare) MEDICAL NECESSITY: I certify, that based on my findings, NURSING services are a medically necessary home health service. HOME BOUND STATUS: I certify that my clinical findings support that this patient is homebound (i.e., Due to illness or injury, pt requires aid of supportive devices such as crutches, cane, wheelchairs, walkers, the use of special transportation or the assistance of another person to leave their place of residence. There is a normal inability to leave the home and doing so requires considerable and taxing effort. Other absences are for medical reasons / religious services and are infrequent or of short duration when for other reasons). o If current dressing causes regression in wound condition, may D/C ordered dressing product/s and apply Normal Saline Moist Dressing daily until next Wound Healing Center / Other MD appointment. Notify Wound Healing Center of regression in wound condition at [phone]. o Please direct any NON-WOUND related issues/requests for orders to patient's Primary Care Physician Negative Pressure Wound Therapy Wound #1 Sacrum o Wound VAC settings at 125/130 mmHg continuous pressure. Use BLACK/GREEN foam to wound cavity. Use WHITE foam to fill any tunnel/s and/or undermining. Change VAC  dressing 3 X WEEK. Change canister as indicated when full. Nurse may titrate settings and frequency of dressing changes as clinically indicated. - HHRN to order supplies for NPWT. PACK FOAM INTO 1 O'CLOCK TUNNEL o Home Health Nurse may d/c VAC for s/s of increased infection, significant wound regression, or uncontrolled drainage. Notify Wound Healing Center at [phone]. o Apply contact layer over base of wound. - apply collagen with silver to wound base under foam Electronic Signature(s) Signed: 01/15/2016 5:17:42 PM By: Elpidio Eric [name], RN Signed: 02/06/2016 7:33:08 AM By: Baltazar Najjar MD Entered By: Elpidio Eric on 01/15/2016 16:15:56 [name], [name] ([id number]) -------------------------------------------------------------------------------- Problem List Details Patient Name: [name], [name] Date of Service: 01/15/2016 3:45 PM Medical Record Patient Account Number: [id number] Number: Treating RN: [name], RN, [name], [name] [birth year] [phone] y.o. Other Clinician: Date of Birth/Sex: Female) Treating Temple Sporer Primary Care Physician/Extender: [name], [name] Physician: Referring Physician: [name] Weeks in Treatment: 13 Active Problems ICD-10 Encounter Code Description Active Date Diagnosis L89.154 Pressure ulcer of sacral region, stage 4 10/15/2015 Yes L03.317 Cellulitis of buttock 10/15/2015 Yes E43 Unspecified severe protein-calorie malnutrition 10/15/2015 Yes L89.213 Pressure ulcer of right hip, stage 3 10/15/2015 Yes Inactive Problems Resolved Problems Electronic Signature(s) Signed: 02/06/2016 7:33:08 AM By: Baltazar Najjar MD Entered By: Baltazar Najjar on 01/16/2016 07:37:17 Krolak, [name] ([id number]) -------------------------------------------------------------------------------- Progress Note Details Patient Name: [name], [name] Date of Service: 01/15/2016 3:45 PM Medical Record Patient Account Number:  [id number] Number: Treating RN: [name], RN, [name], [name] 1935/11/28 [phone] y.o. Other Clinician: Date of Birth/Sex: Female) Treating Melonee Gerstel Primary Care Physician/Extender: [name], [name] Physician: Referring Physician: [name] Weeks in Treatment: 13 Subjective Chief Complaint Information obtained from Patient The patient is here for a sacral decubitus ulcer also a more  superficial wound over her right hip History of Present Illness (HPI) 10/15/15; this is a very frail lady with advanced Alzheimer's disease who is being cared for aerobically at home by her daughter and granddaughter. She is essentially nonambulatory and seems to be minimally verbal. They tell me that she started to develop a pressure area in her sacral area in January. Initially a very small open area that progressed up. There is also a more recent area on the right hip. The patient was seen in her primary office for this problem on 2/23 at that point noted to have a stage III wound with tunneling over the sacrum. There were 2 other areas noted on the upper left and right bilateral occipital. As well as a stage II on the right trochanter. She was referred to the ER because of tachycardia. She was hospitalized from 2/23 through 2/25. The admission this seems to open dominated by a distal fecal impaction. She was noted to have sinus tachycardia which improved. She did have a CT scan of the abdomen and pelvis during the hospitalization. In addition to the fecal impaction this noted the ulcer in the left lower gluteal region adjacent to the midline. There was air in the subcutaneous wound but no evidence of an air-fluid level to suggest an abscess. There was no comment about osteomyelitis. Since her return home she has not been eating well but is drinking. The family provides total care. I note that her albumin was 3 on admission to the hospital. Sodium got as high as 148 but was normalized by the time she  left 10/24/15; this is a patient who has advanced dementia. She has a deep probing stage IV pressure ulcer over her lower sacral area. Cultures of this last week grew both Proteus and methicillin-resistant staph aureus. I and empirically put her on Cipro and doxycycline which should cover both of these. He also has a wound over her right greater trochanter. She is been followed by home health at home they're requesting lab work which I think is reasonable. I ordered a CMP, CBC with differential, sedimentation rate and a C-reactive protein 10/30/15 the patient's labs were done by home health but I think they faxed the orders to the primary physician's office. The plain x-ray I did did not show osteomyelitis however it did show fractures of the right superior and inferior pubic rami which are still not fully healed. I informed the patient's family of this. They do give a history of a fall while she was at an assisted living in [place] in November afterward she had some trouble walking this may have been the issue. 11/13/15 the wound VAC only started 6 days ago. She is apparently eating well 11/20/15; the patient continues with the wound VAC without any difficulties. This is being changed by home health. She is apparently eating well Gumina, Nayleah B. ([id number]) 12/04/15; the patient continues with a wound VAC without any difficulties. They're using collagen under the foam being changed by [facility]. The area over the right greater trochanter is also continued to improve she is eating well and undergoing therapeutic positioning by her granddaughter with regularity 12/18/15; the patient arrives with her daughter and granddaughter the latter of which is the primary caregiver. She continues with the wound VAC with collagen under the foam. She is apparently eating well and they are religiously offloading this area 01/01/16; this is a patient I follow every 2 weeks. She has severe dementia and  developed a stage  IV pressure ulcer over her lower sacrum and coccyx. In spite of the fact that her Alzheimer's disease is advanced, we have had excellent results using collagen under the foam of wound VAC. She has also had meticulous care for predominantly a granddaughter at home. 01/15/16; I follow this lady every 2 weeks for what was a central stage IV pressure wound over her lower sacrum and coccyx. In spite of advanced Alzheimer's disease in mobility and probably some degree of protein calorie malnutrition she is actually had an excellent result to a wound VAC with silver collagen under the VAC foam. She continues to do well the wound is a lot smaller. The tunneling that I was concerned about last week has resolved although there is still undermining superiorly from roughly 10 to 2:00 Objective Constitutional Vitals Time Taken: 3:32 PM, Height: 63 in, Temperature: 98.2 F, Pulse: 83 bpm, Respiratory Rate: 17 breaths/min, Blood Pressure: 135/74 mmHg. Integumentary (Hair, Skin) Wound #1 status is Open. Original cause of wound was Gradually Appeared. The wound is located on the Sacrum. The wound measures 2cm length x 1.3cm width x 0.8cm depth; 2.042cm^2 area and 1.634cm^3 volume. The wound is limited to skin breakdown. There is no tunneling noted, however, there is undermining starting at 12:00 and ending at 2:00 with a maximum distance of 1.5cm. There is a large amount of serosanguineous drainage noted. The wound margin is thickened. There is large (67-100%) red granulation within the wound bed. There is a small (1-33%) amount of necrotic tissue within the wound bed including Adherent Slough. The periwound skin appearance exhibited: Moist. The periwound skin appearance did not exhibit: Callus, Crepitus, Excoriation, Fluctuance, Friable, Induration, Localized Edema, Rash, Scarring, Dry/Scaly, Maceration, Atrophie Blanche, Cyanosis, Ecchymosis, Hemosiderin Staining, Mottled, Pallor, Rubor,  Erythema. Periwound temperature was noted as No Abnormality. The periwound has tenderness on palpation. Assessment Active Problems ICD-10 L89.154 - Pressure ulcer of sacral region, stage 4 [name], [name] ([id number]) L03.317 - Cellulitis of buttock E43 - Unspecified severe protein-calorie malnutrition L89.213 - Pressure ulcer of right hip, stage 3 Plan Wound Cleansing: Wound #1 Sacrum: Clean wound with Normal Saline. Skin Barriers/Peri-Wound Care: Wound #1 Sacrum: Skin Prep Primary Wound Dressing: Wound #1 Sacrum: Prisma Ag - use collagen with silver under foam for NPWT pack into 1 O'clock tunnel Dressing Change Frequency: Wound #1 Sacrum: Change Dressing Monday, Wednesday, Friday Follow-up Appointments: Wound #1 Sacrum: Return Appointment in 2 weeks. Off-Loading: Wound #1 Sacrum: Turn and reposition every 2 hours Mattress [facility] bed in place Additional Orders / Instructions: Wound #1 Sacrum: Increase protein intake. Other: - Supplements: Vitamin C, MVI, Zinc Home Health: Wound #1 Sacrum: Continue Home [facility] Visits - [facility] Home Health Nurse may visit PRN to address patient s wound care needs. FACE TO FACE ENCOUNTER: MEDICARE and MEDICAID PATIENTS: I certify that this patient is under my care and that I had a face-to-face encounter that meets the physician face-to-face encounter requirements with this patient on this date. The encounter with the patient was in whole or in part for the following MEDICAL CONDITION: (primary reason for Home Healthcare) MEDICAL NECESSITY: I certify, that based on my findings, NURSING services are a medically necessary home health service. HOME BOUND STATUS: I certify that my clinical findings support that this patient is homebound (i.e., Due to illness or injury, pt requires aid of supportive devices such as crutches, cane, wheelchairs, walkers, the use of special transportation or the assistance of another person to  leave their place of residence.  There is a normal inability to leave the home and doing so requires considerable and taxing effort. Other absences are for medical reasons / religious services and are infrequent or of short duration when for other reasons). If current dressing causes regression in wound condition, may D/C ordered dressing product/s and apply Normal Saline Moist Dressing daily until next Wound Healing Center / Other MD appointment. Notify Wound [name], [name] ([id number]) Healing Center of regression in wound condition at [phone]. Please direct any NON-WOUND related issues/requests for orders to patient's Primary Care Physician Negative Pressure Wound Therapy: Wound #1 Sacrum: Wound VAC settings at 125/130 mmHg continuous pressure. Use BLACK/GREEN foam to wound cavity. Use WHITE foam to fill any tunnel/s and/or undermining. Change VAC dressing 3 X WEEK. Change canister as indicated when full. Nurse may titrate settings and frequency of dressing changes as clinically indicated. - HHRN to order supplies for NPWT. PACK FOAM INTO 1 O'CLOCK TUNNEL Home Health Nurse may d/c VAC for s/s of increased infection, significant wound regression, or uncontrolled drainage. Notify Wound Healing Center at [phone]. Apply contact layer over base of wound. - apply collagen with silver to wound base under foam #1 at this point I think we continue the wound VAC as long as it seems practical to do so. We've been using collagen under the VAC and I think that should continue. The patient has made really stunning progress here in spite of her immobility, advanced dementia and probably some degree of protein calorie malnutrition. The goal here will be to get the wound to granulate in is much as possible. Follow-up in 2 weeks Electronic Signature(s) Signed: 02/06/2016 7:33:08 AM By: Baltazar Najjarobson, Winner Valeriano MD Entered By: Baltazar Najjarobson, Dimitrios Balestrieri on 01/16/2016 07:39:11 [name], [name]  ([id number]) -------------------------------------------------------------------------------- SuperBill Details Patient Name: Dunsworth, [name] Date of Service: 01/15/2016 Medical Record Patient Account Number: [id number] Number: Treating RN: Clover MealyAfful, RN, [name], [name] [birth year] [phone](80 y.o. Other Clinician: Date of Birth/Sex: Female) Treating Keysean Savino Primary Care Physician/Extender: Marlana LatusG SINGH, [name] Physician: Weeks in Treatment: 13 Referring Phys[facility]: [name] Diagnosis Coding ICD-10 Codes Code Description L89.154 Pressure ulcer of sacral region, stage 4 L03.317 Cellulitis of buttock E43 Unspecified severe protein-calorie malnutrition L89.213 Pressure ulcer of right hip, stage 3 Facility Procedures CPT4 Code: [id number] Description: [id number] - WOUND CARE VISIT-LEV 2 EST PT Modifier: Quantity: 1 Physician Procedures CPT4: Description Modifier Quantity Code [id number] - WC PHYS LEVEL 2 - EST PT 1 ICD-10 Description Diagnosis L89.154 Pressure ulcer of sacral region, stage 4 CPT4: [email or web address] Physician services for initial certification of Medicare-covered home health 1 services, billable once for a patient's home health certification period. ICD-10 Description Diagnosis L89.154 Pressure ulcer of sacral region, stage 4 L03.317  Cellulitis of buttock E43 Unspecified severe protein-calorie malnutrition L89.213 Pressure ulcer of right hip, stage 3 Electronic Signature(s) Signed: 01/16/2016 11:41:21 AM By: Elliot GurneyWoody, RN, [name], Kim RN, [name] Signed: 02/06/2016 7:33:08 AM By: Baltazar Najjarobson, Concepcion Kirkpatrick MD Gerrard, [name] ([id number]) Entered By: Elliot GurneyWoody, RN, [name], Kim on 01/16/2016 07:44:15

## 2016-02-06 NOTE — Progress Notes (Signed)
[name] [name] LOOMAN ([id number]) Visit Report for 01/15/2016 Arrival Information Details Patient Name: [name] [name] B. Date of Service: 01/15/2016 3:45 PM Medical Record Patient Account Number: [id number] Number: Treating RN: [name], RN, [name], [name] October 20, 1935 [phone] y.o. Other Clinician: Date of Birth/Sex: Female) Treating ROBSON, [name] Primary Care Physician: [name] Physician/Extender: G Referring Physician: [name] Weeks in Treatment: 13 Visit Information History Since Last Visit Added or deleted any medications: No Patient Arrived: Wheel Chair Any new allergies or adverse reactions: No Arrival Time: 15:51 Had a fall or experienced change in No activities of daily living that may affect Accompanied By: grddtr risk of falls: Transfer Assistance: None Signs or symptoms of abuse/neglect since last No Patient Identification Verified: Yes visito Secondary Verification Process Yes Hospitalized since last visit: No Completed: Has Dressing in Place as Prescribed: Yes Patient Requires Transmission-Based No Pain Present Now: No Precautions: Patient Has Alerts: No Electronic Signature(s) Signed: 01/15/2016 5:17:42 PM By: Elpidio Eric [name], RN Entered By: Elpidio Eric on 01/15/2016 15:51:35 [name] [name] ([id number]) -------------------------------------------------------------------------------- Clinic Level of Care Assessment Details Patient Name: [name], [name] Stacks B. Date of Service: 01/15/2016 3:45 PM Medical Record Patient Account Number: [id number] Number: Treating RN: [name], RN, [name], [name] 03/29/1936 [phone] y.o. Other Clinician: Date of Birth/Sex: Female) Treating ROBSON, [name] Primary Care Physician: [name] Physician/Extender: G Referring Physician: Thedore Mins, [name] Weeks in Treatment: 13 Clinic Level of Care Assessment Items TOOL 4 Quantity Score []  - Use when only an EandM is performed on FOLLOW-UP visit 0 ASSESSMENTS - Nursing  Assessment / Reassessment X - Reassessment of Co-morbidities (includes updates in patient status) 1 10 X - Reassessment of Adherence to Treatment Plan 1 5 ASSESSMENTS - Wound and Skin Assessment / Reassessment X - Simple Wound Assessment / Reassessment - one wound 1 5 []  - Complex Wound Assessment / Reassessment - multiple wounds 0 []  - Dermatologic / Skin Assessment (not related to wound area) 0 ASSESSMENTS - Focused Assessment []  - Circumferential Edema Measurements - multi extremities 0 []  - Nutritional Assessment / Counseling / Intervention 0 []  - Lower Extremity Assessment (monofilament, tuning fork, pulses) 0 []  - Peripheral Arterial Disease Assessment (using hand held doppler) 0 ASSESSMENTS - Ostomy and/or Continence Assessment and Care []  - Incontinence Assessment and Management 0 []  - Ostomy Care Assessment and Management (repouching, etc.) 0 PROCESS - Coordination of Care X - Simple Patient / Family Education for ongoing care 1 15 []  - Complex (extensive) Patient / Family Education for ongoing care 0 []  - Staff obtains Chiropractor, Records, Test Results / Process Orders 0 []  - Staff telephones HHA, Nursing Homes / Clarify orders / etc 0 [name] [name] ([id number]) []  - Routine Transfer to another Facility (non-emergent condition) 0 []  - Routine Hospital Admission (non-emergent condition) 0 []  - New Admissions / Manufacturing engineer / Ordering NPWT, Apligraf, etc. 0 []  - Emergency Hospital Admission (emergent condition) 0 []  - Simple Discharge Coordination 0 []  - Complex (extensive) Discharge Coordination 0 PROCESS - Special Needs []  - Pediatric / Minor Patient Management 0 []  - Isolation Patient Management 0 []  - Hearing / Language / Visual special needs 0 []  - Assessment of Community assistance (transportation, D/C planning, etc.) 0 []  - Additional assistance / Altered mentation 0 []  - Support Surface(s) Assessment (bed, cushion, seat, etc.) 0 INTERVENTIONS - Wound  Cleansing / Measurement X - Simple Wound Cleansing - one wound 1 5 []  - Complex Wound Cleansing - multiple wounds 0 X - Wound Imaging (photographs - any  number of wounds) 1 5 []  - Wound Tracing (instead of photographs) 0 X - Simple Wound Measurement - one wound 1 5 []  - Complex Wound Measurement - multiple wounds 0 INTERVENTIONS - Wound Dressings X - Small Wound Dressing one or multiple wounds 1 10 []  - Medium Wound Dressing one or multiple wounds 0 []  - Large Wound Dressing one or multiple wounds 0 []  - Application of Medications - topical 0 []  - Application of Medications - injection 0 [name] [name] B. ([id number]) INTERVENTIONS - Miscellaneous []  - External ear exam 0 []  - Specimen Collection (cultures, biopsies, blood, body fluids, etc.) 0 []  - Specimen(s) / Culture(s) sent or taken to Lab for analysis 0 X - Patient Transfer (multiple staff / Michiel SitesHoyer Lift / Similar devices) 1 10 []  - Simple Staple / Suture removal (25 or less) 0 []  - Complex Staple / Suture removal (26 or more) 0 []  - Hypo / Hyperglycemic Management (close monitor of Blood Glucose) 0 []  - Ankle / Brachial Index (ABI) - do not check if billed separately 0 X - Vital Signs 1 5 Has the patient been seen at the hospital within the last three years: Yes Total Score: 75 Level Of Care: New/Established - Level 2 Electronic Signature(s) Signed: 01/15/2016 5:17:42 PM By: Elpidio EricAfful, [name], RN Entered By: Elpidio EricAfful, [name] on 01/15/2016 16:40:10 [name] [name] [name] ([id number]) -------------------------------------------------------------------------------- Encounter Discharge Information Details Patient Name: [name], [name] StacksWILMA B. Date of Service: 01/15/2016 3:45 PM Medical Record Patient Account Number: [id number] Number: Treating RN: Clover MealyAfful, RN, [name], [name] Jun 09, 1936 [phone](80 y.o. Other Clinician: Date of Birth/Sex: Female) Treating ROBSON, [name] Primary Care Physician: Leotis ShamesSINGH, [name] Physician/Extender: G Referring  Physician: Leotis ShamesSINGH, [name] Weeks in Treatment: 13 Encounter Discharge Information Items Discharge Pain Level: 0 Discharge Condition: Stable Ambulatory Status: Wheelchair Discharge Destination: Home Transportation: Private Auto Accompanied By: grddtr Schedule Follow-up Appointment: No Medication Reconciliation completed and provided to Patient/Care No Daylynn Stumpp: Provided on Clinical Summary of Care: 01/15/2016 Form Type Recipient Paper Patient WJ Electronic Signature(s) Signed: 01/15/2016 4:47:42 PM By: Gwenlyn PerkingMoore, Shelia Entered By: Gwenlyn PerkingMoore, Shelia on 01/15/2016 16:47:42 Sendejo, [name] [name] ([id number]) -------------------------------------------------------------------------------- Multi Wound Chart Details Patient Name: Binns, [name] StacksWILMA B. Date of Service: 01/15/2016 3:45 PM Medical Record Patient Account Number: [id number] Number: Treating RN: Clover MealyAfful, RN, [name], [name] Jun 09, 1936 [phone](80 y.o. Other Clinician: Date of Birth/Sex: Female) Treating ROBSON, [name] Primary Care Physician: Leotis ShamesSINGH, [name] Physician/Extender: G Referring Physician: Thedore MinsSINGH, [name] Weeks in Treatment: 13 Vital Signs Height(in): 63 Pulse(bpm): 83 Weight(lbs): Blood Pressure 135/74 (mmHg): Body Mass Index(BMI): Temperature(F): 98.2 Respiratory Rate 17 (breaths/min): Photos: [1:No Photos] [N/A:N/A] Wound Location: [1:Sacrum] [N/A:N/A] Wounding Event: [1:Gradually Appeared] [N/A:N/A] Primary Etiology: [1:Pressure Ulcer] [N/A:N/A] Comorbid History: [1:Cataracts, Anemia, Arrhythmia, Hypertension, Myocardial Infarction, History of pressure wounds, Osteoarthritis, Dementia] [N/A:N/A] Date Acquired: [1:09/10/2015] [N/A:N/A] Weeks of Treatment: [1:13] [N/A:N/A] Wound Status: [1:Open] [N/A:N/A] Measurements L x W x D 2x1.3x0.8 [N/A:N/A] (cm) Area (cm) : [1:2.042] [N/A:N/A] Volume (cm) : [1:1.634] [N/A:N/A] % Reduction in Area: [1:86.50%] [N/A:N/A] % Reduction in Volume: 97.60% [N/A:N/A] Starting  Position 1 12 (o'clock): Ending Position 1 [1:2] (o'clock): Maximum Distance 1 1.5 (cm): Undermining: [1:Yes] [N/A:N/A] Classification: [1:Category/Stage IV] [N/A:N/A] Exudate Amount: [1:Large] [N/A:N/A] Exudate Type: Serosanguineous N/A N/A Exudate Color: red, brown N/A N/A Foul Odor After Yes N/A N/A Cleansing: Odor Anticipated Due to No N/A N/A Product Use: Wound Margin: Thickened N/A N/A Granulation Amount: Large (67-100%) N/A N/A Granulation Quality: Red N/A N/A Necrotic Amount: Small (1-33%) N/A N/A Exposed Structures: Fascia: No N/A N/A Fat: No  Tendon: No Muscle: No Joint: No Bone: No Limited to Skin Breakdown Epithelialization: None N/A N/A Periwound Skin Texture: Edema: No N/A N/A Excoriation: No Induration: No Callus: No Crepitus: No Fluctuance: No Friable: No Rash: No Scarring: No Periwound Skin Moist: Yes N/A N/A Moisture: Maceration: No Dry/Scaly: No Periwound Skin Color: Atrophie Blanche: No N/A N/A Cyanosis: No Ecchymosis: No Erythema: No Hemosiderin Staining: No Mottled: No Pallor: No Rubor: No Temperature: No Abnormality N/A N/A Tenderness on Yes N/A N/A Palpation: Wound Preparation: Ulcer Cleansing: N/A N/A Rinsed/Irrigated with Saline Topical Anesthetic Applied: Other: lidocaine 4% [name] [name] CASSETTA ([id number]) Treatment Notes Electronic Signature(s) Signed: 01/15/2016 5:17:42 PM By: Elpidio Eric [name], RN Entered By: Elpidio Eric on 01/15/2016 16:15:24 Arakelian, [name] [name] ([id number]) -------------------------------------------------------------------------------- Multi-Disciplinary Care Plan Details Patient Name: Bartholomew, [name] Stacks B. Date of Service: 01/15/2016 3:45 PM Medical Record Patient Account Number: [id number] Number: Treating RN: [name], RN, [name], [name] 06-30-1936 [phone] y.o. Other Clinician: Date of Birth/Sex: Female) Treating ROBSON, [name] Primary Care Physician: [name] Physician/Extender: G Referring  Physician: Thedore Mins, [name] Weeks in Treatment: 13 Active Inactive Abuse / Safety / Falls / Self Care Management Nursing Diagnoses: Impaired home maintenance Impaired physical mobility Knowledge deficit related to: safety; personal, health (wound), emergency Potential for falls Self care deficit: actual or potential Goals: Patient will remain injury free Date Initiated: 10/15/2015 Goal Status: Active Patient/caregiver will verbalize understanding of skin care regimen Date Initiated: 10/15/2015 Goal Status: Active Patient/caregiver will verbalize/demonstrate measure taken to improve self care Date Initiated: 10/15/2015 Goal Status: Active Patient/caregiver will verbalize/demonstrate measures taken to improve the patient's personal safety Date Initiated: 10/15/2015 Goal Status: Active Patient/caregiver will verbalize/demonstrate measures taken to prevent injury and/or falls Date Initiated: 10/15/2015 Goal Status: Active Patient/caregiver will verbalize/demonstrate understanding of what to do in case of emergency Date Initiated: 10/15/2015 Goal Status: Active Interventions: Assess fall risk on admission and as needed Assess: immobility, friction, shearing, incontinence upon admission and as needed Assess impairment of mobility on admission and as needed per policy [name] [name] B. ([id number]) Assess self care needs on admission and as needed Provide education on basic hygiene Provide education on fall prevention Provide education on personal and home safety Provide education on safe transfers Treatment Activities: Education provided on Basic Hygiene : 01/01/2016 Patient referred to home care : 11/20/2015 Notes: Nutrition Nursing Diagnoses: Imbalanced nutrition Impaired glucose control: actual or potential Potential for alteratiion in Nutrition/Potential for imbalanced nutrition Goals: Patient/caregiver agrees to and verbalizes understanding of need to obtain nutritional  consultation Date Initiated: 10/15/2015 Goal Status: Active Patient/caregiver agrees to and verbalizes understanding of need to use nutritional supplements and/or vitamins as prescribed Date Initiated: 10/15/2015 Goal Status: Active Patient/caregiver verbalizes understanding of need to maintain therapeutic glucose control per primary care physician Date Initiated: 10/15/2015 Goal Status: Active Interventions: Assess patient nutrition upon admission and as needed per policy Provide education on elevated blood sugars and impact on wound healing Provide education on nutrition Treatment Activities: Education provided on Nutrition : 01/01/2016 Notes: Orientation to the Wound Care Program Nursing Diagnoses: Knowledge deficit related to the wound healing center program [name] [name] [employer] B. ([id number]) Goals: Patient/caregiver will verbalize understanding of the Wound Healing Center Program Date Initiated: 10/15/2015 Goal Status: Active Interventions: Provide education on orientation to the wound center Notes: Pressure Nursing Diagnoses: Knowledge deficit related to causes and risk factors for pressure ulcer development Knowledge deficit related to management of pressures ulcers Potential for impaired tissue integrity related to pressure, friction, moisture, and shear Goals: Patient will remain free  from development of additional pressure ulcers Date Initiated: 10/15/2015 Goal Status: Active Patient will remain free of pressure ulcers Date Initiated: 10/15/2015 Goal Status: Active Patient/caregiver will verbalize risk factors for pressure ulcer development Date Initiated: 10/15/2015 Goal Status: Active Patient/caregiver will verbalize understanding of pressure ulcer management Date Initiated: 10/15/2015 Goal Status: Active Interventions: Assess: immobility, friction, shearing, incontinence upon admission and as needed Assess offloading mechanisms upon admission and as needed Assess  potential for pressure ulcer upon admission and as needed Provide education on pressure ulcers Treatment Activities: Patient referred for home evaluation of offloading devices/mattresses : 11/20/2015 Patient referred for pressure reduction/relief devices : 11/20/2015 Patient referred for seating evaluation to ensure proper offloading : 11/20/2015 Pressure reduction/relief device ordered : 11/20/2015 Notes: Branton, [name] [name] ([id number]) Wound/Skin Impairment Nursing Diagnoses: Impaired tissue integrity Knowledge deficit related to smoking impact on wound healing Knowledge deficit related to ulceration/compromised skin integrity Goals: Patient/caregiver will verbalize understanding of skin care regimen Date Initiated: 10/15/2015 Goal Status: Active Ulcer/skin breakdown will have a volume reduction of 30% by week 4 Date Initiated: 10/15/2015 Goal Status: Active Ulcer/skin breakdown will have a volume reduction of 50% by week 8 Date Initiated: 10/15/2015 Goal Status: Active Ulcer/skin breakdown will have a volume reduction of 80% by week 12 Date Initiated: 10/15/2015 Goal Status: Active Ulcer/skin breakdown will heal within 14 weeks Date Initiated: 10/15/2015 Goal Status: Active Interventions: Assess patient/caregiver ability to obtain necessary supplies Assess patient/caregiver ability to perform ulcer/skin care regimen upon admission and as needed Assess ulceration(s) every visit Provide education on ulcer and skin care Treatment Activities: Patient referred to home care : 11/20/2015 Skin care regimen initiated : 11/20/2015 Notes: Electronic Signature(s) Signed: 01/15/2016 5:17:42 PM By: Elpidio Eric [name], RN Entered By: Elpidio Eric on 01/15/2016 16:15:13 Vasko, [name] [name] ([id number]) -------------------------------------------------------------------------------- Pain Assessment Details Patient Name: Soo, [name] Stacks B. Date of Service: 01/15/2016 3:45 PM Medical Record Patient  Account Number: [id number] Number: Treating RN: [name], RN, [name], [name] [birth year] [phone] y.o. Other Clinician: Date of Birth/Sex: Female) Treating ROBSON, [name] Primary Care Physician: [name] Physician/Extender: G Referring Physician: Thedore Mins, [name] Weeks in Treatment: 13 Active Problems Location of Pain Severity and Description of Pain Patient Has Paino No Site Locations Pain Management and Medication Current Pain Management: Electronic Signature(s) Signed: 01/15/2016 5:17:42 PM By: Elpidio Eric [name], RN Entered By: Elpidio Eric on 01/15/2016 15:51:42 Issac, [name] [name] ([id number]) -------------------------------------------------------------------------------- Patient/Caregiver Education Details Patient Name: Alioto, [name] Stacks B. Date of Service: 01/15/2016 3:45 PM Medical Record Patient Account Number: [id number] Number: Treating RN: [name], RN, [name], [name] [birth year] [phone] y.o. Other Clinician: Date of Birth/Gender: Female) Treating ROBSON, [name] Primary Care Physician: [name] Physician/Extender: G Referring Physician: [name] Weeks in Treatment: 13 Education Assessment Education Provided To: Patient Education Topics Provided Basic Hygiene: Methods: Explain/Verbal Responses: State content correctly Elevated Blood Sugar/ Impact on Healing: Methods: Explain/Verbal Responses: State content correctly Nutrition: Methods: Explain/Verbal Responses: State content correctly Pressure: Methods: Explain/Verbal Responses: State content correctly Safety: Methods: Explain/Verbal Responses: State content correctly Welcome To The Wound [facility] Center: Methods: Explain/Verbal Wound/Skin Impairment: Methods: Explain/Verbal Responses: State content correctly Electronic Signature(s) Signed: 01/15/2016 5:17:42 PM By: Elpidio Eric [name], RN Entered By: Elpidio Eric on 01/15/2016 16:42:45 Ibach, [name] [name] ([id number]) Watterson, [name] [name]  ([id number]) -------------------------------------------------------------------------------- Wound Assessment Details Patient Name: Mckeithan, [name] Stacks B. Date of Service: 01/15/2016 3:45 PM Medical Record Patient Account Number: [id number] Number: Treating RN: [name], RN, [name], [name] [birth year] [phone] y.o. Other Clinician: Date of Birth/Sex: Female) Treating ROBSON, [name] Primary Care  Physician: Leotis ShamesSINGH, [name] Physician/Extender: G Referring Physician: Leotis ShamesSINGH, [name] Weeks in Treatment: 13 Wound Status Wound Number: 1 Primary Pressure Ulcer Etiology: Wound Location: Sacrum Wound Open Wounding Event: Gradually Appeared Status: Date Acquired: 09/10/2015 Comorbid Cataracts, Anemia, Arrhythmia, Weeks Of Treatment: 13 History: Hypertension, Myocardial Infarction, Clustered Wound: No History of pressure wounds, Osteoarthritis, Dementia Wound Measurements Length: (cm) 2 % Reduction in Area: Width: (cm) 1.3 % Reduction in Volum Depth: (cm) 0.8 Epithelialization: Area: (cm) 2.042 Tunneling: Volume: (cm) 1.634 Undermining: Starting Position Ending Position ( Maximum Distance: 86.5% e: 97.6% None No Yes (o'clock): 12 o'clock): 2 (cm) 1.5 Wound Description Classification: Category/Stage IV Foul Odor After Cle Wound Margin: Thickened Due to Product Use: Exudate Amount: Large Exudate Type: Serosanguineous Exudate Color: red, brown ansing: Yes No Wound Bed Granulation Amount: Large (67-100%) Exposed Structure Granulation Quality: Red Fascia Exposed: No Necrotic Amount: Small (1-33%) Fat Layer Exposed: No Necrotic Quality: Adherent Slough Tendon Exposed: No Muscle Exposed: No Joint Exposed: No Bone Exposed: No Limited to Skin Breakdown Wiedman, Bridgid B. ([id number]) Periwound Skin Texture Texture Color No Abnormalities Noted: No No Abnormalities Noted: No Callus: No Atrophie Blanche: No Crepitus: No Cyanosis: No Excoriation: No Ecchymosis:  No Fluctuance: No Erythema: No Friable: No Hemosiderin Staining: No Induration: No Mottled: No Localized Edema: No Pallor: No Rash: No Rubor: No Scarring: No Temperature / Pain Moisture Temperature: No Abnormality No Abnormalities Noted: No Tenderness on Palpation: Yes Dry / Scaly: No Maceration: No Moist: Yes Wound Preparation Ulcer Cleansing: Rinsed/Irrigated with Saline Topical Anesthetic Applied: Other: lidocaine 4%, Electronic Signature(s) Signed: 01/15/2016 5:17:42 PM By: Elpidio EricAfful, [name], RN Entered By: Elpidio EricAfful, [name] on 01/15/2016 16:03:04 Figge, [name] [name] ([id number]) -------------------------------------------------------------------------------- Vitals Details Patient Name: Cokley, [name] StacksWILMA B. Date of Service: 01/15/2016 3:45 PM Medical Record Patient Account Number: [id number] Number: Treating RN: Clover MealyAfful, RN, [name], [name] January 18, 1936 [phone](80 y.o. Other Clinician: Date of Birth/Sex: Female) Treating ROBSON, [name] Primary Care Physician: Leotis ShamesSINGH, [name] Physician/Extender: G Referring Physician: Thedore MinsSINGH, [name] Weeks in Treatment: 13 Vital Signs Time Taken: 15:32 Temperature (F): 98.2 Height (in): 63 Pulse (bpm): 83 Respiratory Rate (breaths/min): 17 Blood Pressure (mmHg): 135/74 Reference Range: 80 - 120 mg / dl Electronic Signature(s) Signed: 01/15/2016 5:17:42 PM By: Elpidio EricAfful, [name], RN Entered By: Elpidio EricAfful, [name] on 01/15/2016 15:52:14

## 2016-02-10 DIAGNOSIS — I10 Essential (primary) hypertension: Secondary | ICD-10-CM | POA: Diagnosis not present

## 2016-02-10 DIAGNOSIS — R000 Tachycardia, unspecified: Secondary | ICD-10-CM | POA: Diagnosis not present

## 2016-02-10 DIAGNOSIS — E785 Hyperlipidemia, unspecified: Secondary | ICD-10-CM | POA: Diagnosis not present

## 2016-02-10 DIAGNOSIS — L89150 Pressure ulcer of sacral region, unstageable: Secondary | ICD-10-CM | POA: Diagnosis not present

## 2016-02-10 DIAGNOSIS — G309 Alzheimer's disease, unspecified: Secondary | ICD-10-CM | POA: Diagnosis not present

## 2016-02-10 DIAGNOSIS — L89210 Pressure ulcer of right hip, unstageable: Secondary | ICD-10-CM | POA: Diagnosis not present

## 2016-02-10 DIAGNOSIS — L89620 Pressure ulcer of left heel, unstageable: Secondary | ICD-10-CM | POA: Diagnosis not present

## 2016-02-12 ENCOUNTER — Encounter: Payer: Medicare Other | Attending: Nurse Practitioner | Admitting: Nurse Practitioner

## 2016-02-12 DIAGNOSIS — F0280 Dementia in other diseases classified elsewhere without behavioral disturbance: Secondary | ICD-10-CM | POA: Diagnosis not present

## 2016-02-12 DIAGNOSIS — I252 Old myocardial infarction: Secondary | ICD-10-CM | POA: Diagnosis not present

## 2016-02-12 DIAGNOSIS — M1990 Unspecified osteoarthritis, unspecified site: Secondary | ICD-10-CM | POA: Diagnosis not present

## 2016-02-12 DIAGNOSIS — Z880 Allergy status to penicillin: Secondary | ICD-10-CM | POA: Insufficient documentation

## 2016-02-12 DIAGNOSIS — G309 Alzheimer's disease, unspecified: Secondary | ICD-10-CM | POA: Diagnosis not present

## 2016-02-12 DIAGNOSIS — D649 Anemia, unspecified: Secondary | ICD-10-CM | POA: Insufficient documentation

## 2016-02-12 DIAGNOSIS — E43 Unspecified severe protein-calorie malnutrition: Secondary | ICD-10-CM | POA: Diagnosis not present

## 2016-02-12 DIAGNOSIS — L89154 Pressure ulcer of sacral region, stage 4: Secondary | ICD-10-CM | POA: Diagnosis not present

## 2016-02-12 DIAGNOSIS — Z87891 Personal history of nicotine dependence: Secondary | ICD-10-CM | POA: Diagnosis not present

## 2016-02-12 DIAGNOSIS — I10 Essential (primary) hypertension: Secondary | ICD-10-CM | POA: Insufficient documentation

## 2016-02-12 DIAGNOSIS — L03317 Cellulitis of buttock: Secondary | ICD-10-CM | POA: Diagnosis not present

## 2016-02-13 NOTE — Progress Notes (Addendum)
[name] ([id number]) Visit Report for 02/12/2016 Arrival Information Details Patient Name: [name], [name] Date of Service: 02/12/2016 3:45 PM Medical Record Number: [id number] Patient Account Number: [id number] Date of Birth/Sex: [birth year] (80 y.o. Female) Treating RN: Leonard Downing Primary Care Physician: [name] Other Clinician: Referring Physician: [name] Treating Physician/Extender: Eugene Garnet in Treatment: 17 Visit Information History Since Last Visit All ordered tests and consults were No Patient Arrived: Wheel Chair completed: Arrival Time: 16:03 Added or deleted any medications: No Accompanied By: daughter/granddaughter Any new allergies or adverse reactions: No Transfer Assistance: Manual Had a fall or experienced change in No Patient Identification Yes activities of daily living that may affect Verified: risk of falls: Secondary Verification Yes Signs or symptoms of abuse/neglect since No Process Completed: last visito Patient Requires No Hospitalized since last visit: No Transmission-Based Has Dressing in Place as Prescribed: Yes Precautions: Pain Present Now: No Patient Has Alerts: No Electronic Signature(s) Signed: 02/12/2016 4:17:03 PM By: Lucrezia Starch, RN, Sendra Entered By: Lucrezia Starch RN, Sendra on 02/12/2016 16:04:29 Maher, [name] ([id number]) -------------------------------------------------------------------------------- Clinic Level of Care Assessment Details Patient Name: [name], [name] Date of Service: 02/12/2016 3:45 PM Medical Record Number: [id number] Patient Account Number: [id number] Date of Birth/Sex: Dec 28, 1935 (80 y.o. Female) Treating RN: Leonard Downing Primary Care Physician: [name] Other Clinician: Referring Physician: [name] Treating Physician/Extender: Eugene Garnet in Treatment: 17 Clinic Level of Care Assessment Items TOOL 4 Quantity Score X - Use when only  an EandM is performed on FOLLOW-UP visit 1 0 ASSESSMENTS - Nursing Assessment / Reassessment X - Reassessment of Co-morbidities (includes updates in patient status) 1 10 X - Reassessment of Adherence to Treatment Plan 1 5 ASSESSMENTS - Wound and Skin Assessment / Reassessment X - Simple Wound Assessment / Reassessment - one wound 1 5 []  - Complex Wound Assessment / Reassessment - multiple wounds 0 []  - Dermatologic / Skin Assessment (not related to wound area) 0 ASSESSMENTS - Focused Assessment []  - Circumferential Edema Measurements - multi extremities 0 []  - Nutritional Assessment / Counseling / Intervention 0 []  - Lower Extremity Assessment (monofilament, tuning fork, pulses) 0 []  - Peripheral Arterial Disease Assessment (using hand held doppler) 0 ASSESSMENTS - Ostomy and/or Continence Assessment and Care []  - Incontinence Assessment and Management 0 []  - Ostomy Care Assessment and Management (repouching, etc.) 0 PROCESS - Coordination of Care X - Simple Patient / Family Education for ongoing care 1 15 []  - Complex (extensive) Patient / Family Education for ongoing care 0 X - Staff obtains Chiropractor, Records, Test Results / Process Orders 1 10 X - Staff telephones HHA, Nursing Homes / Clarify orders / etc 1 10 []  - Routine Transfer to another Facility (non-emergent condition) 0 Figg, [name] ([id number]) []  - Routine Hospital Admission (non-emergent condition) 0 []  - New Admissions / Manufacturing engineer / Ordering NPWT, Apligraf, etc. 0 []  - Emergency Hospital Admission (emergent condition) 0 X - Simple Discharge Coordination 1 10 []  - Complex (extensive) Discharge Coordination 0 PROCESS - Special Needs []  - Pediatric / Minor Patient Management 0 []  - Isolation Patient Management 0 []  - Hearing / Language / Visual special needs 0 []  - Assessment of Community assistance (transportation, D/C planning, etc.) 0 []  - Additional assistance / Altered mentation 0 []  - Support  Surface(s) Assessment (bed, cushion, seat, etc.) 0 INTERVENTIONS - Wound Cleansing / Measurement X - Simple Wound Cleansing - one wound 1 5 []  - Complex Wound Cleansing - multiple wounds 0 X -  Wound Imaging (photographs - any number of wounds) 1 5  - Wound Tracing (instead of photographs) 0 X - Simple Wound Measurement - one wound 1 5  - Complex Wound Measurement - multiple wounds 0 INTERVENTIONS - Wound Dressings X - Small Wound Dressing one or multiple wounds 1 10  - Medium Wound Dressing one or multiple wounds 0  - Large Wound Dressing one or multiple wounds 0  - Application of Medications - topical 0  - Application of Medications - injection 0 INTERVENTIONS - Miscellaneous  - External ear exam 0 Nogueira, Gennifer B. ([id number])  - Specimen Collection (cultures, biopsies, blood, body fluids, etc.) 0  - Specimen(s) / Culture(s) sent or taken to Lab for analysis 0 X - Patient Transfer (multiple staff / Michiel Sites Lift / Similar devices) 1 10  - Simple Staple / Suture removal (25 or less) 0  - Complex Staple / Suture removal (26 or more) 0  - Hypo / Hyperglycemic Management (close monitor of Blood Glucose) 0  - Ankle / Brachial Index (ABI) - do not check if billed separately 0  - Vital Signs 0 Has the patient been seen at the hospital within the last three years: Yes Total Score: 100 Level Of Care: New/Established - Level 3 Electronic Signature(s) Signed: 02/12/2016 4:17:03 PM By: Lucrezia Starch, RN, Sendra Entered By: Lucrezia Starch RN, Sendra on 02/12/2016 16:16:08 Voges, [name] ([id number]) -------------------------------------------------------------------------------- Encounter Discharge Information Details Patient Name: [name], [name] Date of Service: 02/12/2016 3:45 PM Medical Record Number: [id number] Patient Account Number: [id number] Date of Birth/Sex: Mar 20, 1936 (80 y.o. Female) Treating RN: Leonard Downing Primary Care Physician: [name] Other  Clinician: Referring Physician: [name] Treating Physician/Extender: Eugene Garnet in Treatment: 17 Encounter Discharge Information Items Facility Notification Discharge Pain Level: 0 Facility Type: Home Health Discharge Condition: Stable Orders Sent: Yes Ambulatory Status: Wheelchair Discharge Destination: Home Transportation: Private Auto Accompanied By: family Schedule Follow-up Appointment: Yes Medication Reconciliation completed and provided to Patient/Care Yes Paquita Printy: Provided on Clinical Summary of Care: 02/12/2016 Form Type Recipient Paper Patient WJ Electronic Signature(s) Signed: 02/12/2016 4:32:36 PM By: Gwenlyn Perking Previous Signature: 02/12/2016 4:17:03 PM Version By: Lucrezia Starch RN, Sendra Entered By: Gwenlyn Perking on 02/12/2016 16:32:36 Vieau, [name] ([id number]) -------------------------------------------------------------------------------- Lower Extremity Assessment Details Patient Name: Belmar, [name] Date of Service: 02/12/2016 3:45 PM Medical Record Number: [id number] Patient Account Number: [id number] Date of Birth/Sex: 03/11/1936 (80 y.o. Female) Treating RN: Leonard Downing Primary Care Physician: [name] Other Clinician: Referring Physician: [name] Treating Physician/Extender: Eugene Garnet in Treatment: 17 Electronic Signature(s) Signed: 02/12/2016 4:17:03 PM By: Lucrezia Starch RN, Sendra Entered By: Lucrezia Starch RN, Sendra on 02/12/2016 16:04:37 Menzie, [name] ([id number]) -------------------------------------------------------------------------------- Pain Assessment Details Patient Name: Gonnella, [name] Date of Service: 02/12/2016 3:45 PM Medical Record Number: [id number] Patient Account Number: [id number] Date of Birth/Sex: 1936-06-06 (80 y.o. Female) Treating RN: Leonard Downing Primary Care Physician: [name] Other Clinician: Referring Physician: [name] Treating  Physician/Extender: Eugene Garnet in Treatment: 17 Active Problems Location of Pain Severity and Description of Pain Patient Has Paino No Site Locations Pain Management and Medication Current Pain Management: Electronic Signature(s) Signed: 02/12/2016 4:17:03 PM By: Lucrezia Starch RN, Sendra Entered By: Lucrezia Starch RN, Sendra on 02/12/2016 16:07:56 Aderhold, [name] ([id number]) -------------------------------------------------------------------------------- Patient/Caregiver Education Details Patient Name: Stevick, [name] Date of Service: 02/12/2016 3:45 PM Medical Record Number: [id number] Patient Account Number: [id number] Date of Birth/Gender: 07-Mar-1936 (80 y.o. Female) Treating RN: Leonard Downing Primary Care Physician: [name] Other Clinician: Referring Physician:  [facility] [name] Treating Physician/Extender: Eugene GarnetSaunders, Sharon Weeks in Treatment: 17 Education Assessment Education Provided To: Caregiver Education Topics Provided Wound/Skin Impairment: Handouts: Caring for Your Ulcer, Skin Care Do's and Dont's Methods: Explain/Verbal Responses: State content correctly Electronic Signature(s) Signed: 02/12/2016 4:17:03 PM By: Lucrezia [place]arch[place], RN, Sendra Entered By: Lucrezia [place]arch[place], RN, Sendra on 02/12/2016 16:10:46 Thilges, [name] ([id number]) -------------------------------------------------------------------------------- Wound Assessment Details Patient Name: Watson, Marylouise StacksWILMA B. Date of Service: 02/12/2016 3:45 PM Medical Record Number: [id number] Patient Account Number: [id number] Date of Birth/Sex: Jul 26, 1936 (80 y.o. Female) Treating RN: Leonard Downing[place], Sendra Primary Care Physician: Leotis ShamesSINGH, [name] Other Clinician: Referring Physician: Leotis ShamesSINGH, [name] Treating Physician/Extender: Eugene GarnetSaunders, Sharon Weeks in Treatment: 17 Wound Status Wound Number: 1 Primary Pressure Ulcer Etiology: Wound Location: Sacrum Wound Open Wounding Event: Gradually Appeared Status: Date Acquired:  09/10/2015 Comorbid Cataracts, Anemia, Arrhythmia, Weeks Of Treatment: 17 History: Hypertension, Myocardial Infarction, Clustered Wound: No History of pressure wounds, Osteoarthritis, Dementia Photos Photo Uploaded By: Lucrezia Starch[place], RN, Rosalio MacadamiaSendra on 02/12/2016 16:18:49 Wound Measurements Length: (cm) 1.5 % Reduction in A Width: (cm) 1.1 % Reduction in V Depth: (cm) 0.5 Epithelializatio Area: (cm) 1.296 Tunneling: Volume: (cm) 0.648 Undermining: rea: 91.4% olume: 99% n: None No No Wound Description Classification: Category/Stage IV Wound Margin: Thickened Exudate Amount: Large Exudate Type: Serosanguineous Exudate Color: red, brown Foul Odor After Cleansing: No Wound Bed Granulation Amount: Large (67-100%) Exposed Structure Granulation Quality: Red Fascia Exposed: No Necrotic Amount: Small (1-33%) Fat Layer Exposed: No Basque, Davanna B. ([id number]) Necrotic Quality: Adherent Slough Tendon Exposed: No Muscle Exposed: No Joint Exposed: No Bone Exposed: No Limited to Skin Breakdown Periwound Skin Texture Texture Color No Abnormalities Noted: No No Abnormalities Noted: No Callus: No Atrophie Blanche: No Crepitus: No Cyanosis: No Excoriation: No Ecchymosis: No Fluctuance: No Erythema: No Friable: No Hemosiderin Staining: No Induration: No Mottled: No Localized Edema: No Pallor: No Rash: No Rubor: No Scarring: No Temperature / Pain Moisture Temperature: No Abnormality No Abnormalities Noted: No Tenderness on Palpation: Yes Dry / Scaly: No Maceration: No Moist: Yes Wound Preparation Ulcer Cleansing: Rinsed/Irrigated with Saline Topical Anesthetic Applied: Other: lidocaine 4%, Treatment Notes Wound #1 (Sacrum) 1. Cleansed with: Clean wound with Normal Saline 4. Dressing Applied: Prisma Ag 5. Secondary Dressing Applied Bordered Foam Dressing Electronic Signature(s) Signed: 02/12/2016 4:17:03 PM By: Lucrezia [place]arch[place], RN, Sendra Entered By: Lucrezia [place]arch[place], RN,  Sendra on 02/12/2016 16:07:46

## 2016-02-14 DIAGNOSIS — I10 Essential (primary) hypertension: Secondary | ICD-10-CM | POA: Diagnosis not present

## 2016-02-14 DIAGNOSIS — L89620 Pressure ulcer of left heel, unstageable: Secondary | ICD-10-CM | POA: Diagnosis not present

## 2016-02-14 DIAGNOSIS — L89150 Pressure ulcer of sacral region, unstageable: Secondary | ICD-10-CM | POA: Diagnosis not present

## 2016-02-14 DIAGNOSIS — R000 Tachycardia, unspecified: Secondary | ICD-10-CM | POA: Diagnosis not present

## 2016-02-14 DIAGNOSIS — G309 Alzheimer's disease, unspecified: Secondary | ICD-10-CM | POA: Diagnosis not present

## 2016-02-14 DIAGNOSIS — L89210 Pressure ulcer of right hip, unstageable: Secondary | ICD-10-CM | POA: Diagnosis not present

## 2016-02-14 DIAGNOSIS — E785 Hyperlipidemia, unspecified: Secondary | ICD-10-CM | POA: Diagnosis not present

## 2016-02-14 NOTE — Progress Notes (Signed)
[name] [name] HERNE ([id number]) Visit Report for 02/12/2016 Chief Complaint Document Details Patient Name: [name] [name] B. Date of Service: 02/12/2016 3:45 PM Medical Record Number: [id number] Patient Account Number: [id number] Date of Birth/Sex: 1935/12/26 (80 y.o. Female) Treating RN: Leonard Downing Primary Care Physician: [name] Other Clinician: Referring Physician: [name] Treating Physician/Extender: Eugene Garnet in Treatment: 17 Information Obtained from: Patient Chief Complaint The patient is here for a sacral decubitus ulcer also a more superficial wound over her right hip Electronic Signature(s) Signed: 02/13/2016 5:22:48 PM By: Georges Lynch FNP Entered By: Georges Lynch on 02/12/2016 16:51:57 [name] [name] ([id number]) -------------------------------------------------------------------------------- HPI Details Patient Name: [name] [name] B. Date of Service: 02/12/2016 3:45 PM Medical Record Number: [id number] Patient Account Number: [id number] Date of Birth/Sex: May 15, 1936 (81 y.o. Female) Treating RN: Leonard Downing Primary Care Physician: [name] Other Clinician: Referring Physician: [name] Treating Physician/Extender: Eugene Garnet in Treatment: 17 History of Present Illness HPI Description: 10/15/15; this is a very frail lady with advanced Alzheimer's disease who is being cared for aerobically at home by her daughter and granddaughter. She is essentially nonambulatory and seems to be minimally verbal. They tell me that she started to develop a pressure area in her sacral area in January. Initially a very small open area that progressed up. There is also a more recent area on the right hip. The patient was seen in her primary office for this problem on 2/23 at that point noted to have a stage III wound with tunneling over the sacrum. There were 2 other areas noted on the upper left and right  bilateral occipital. As well as a stage II on the right trochanter. She was referred to the ER because of tachycardia. She was hospitalized from 2/23 through 2/25. The admission this seems to open dominated by a distal fecal impaction. She was noted to have sinus tachycardia which improved. She did have a CT scan of the abdomen and pelvis during the hospitalization. In addition to the fecal impaction this noted the ulcer in the left lower gluteal region adjacent to the midline. There was air in the subcutaneous wound but no evidence of an air-fluid level to suggest an abscess. There was no comment about osteomyelitis. Since her return home she has not been eating well but is drinking. The family provides total care. I note that her albumin was 3 on admission to the hospital. Sodium got as high as 148 but was normalized by the time she left 10/24/15; this is a patient who has advanced dementia. She has a deep probing stage IV pressure ulcer over her lower sacral area. Cultures of this last week grew both Proteus and methicillin-resistant staph aureus. I and empirically put her on Cipro and doxycycline which should cover both of these. He also has a wound over her right greater trochanter. She is been followed by home health at home they're requesting lab work which I think is reasonable. I ordered a CMP, CBC with differential, sedimentation rate and a C-reactive protein 10/30/15 the patient's labs were done by home health but I think they faxed the orders to the primary physician's office. The plain x-ray I did did not show osteomyelitis however it did show fractures of the right superior and inferior pubic rami which are still not fully healed. I informed the patient's family of this. They do give a history of a fall while she was at an assisted living in [place] in November afterward she had some trouble walking this  may have been the issue. 11/13/15 the wound VAC only started 6 days ago. She is  apparently eating well 11/20/15; the patient continues with the wound VAC without any difficulties. This is being changed by home health. She is apparently eating well 12/04/15; the patient continues with a wound VAC without any difficulties. They're using collagen under the foam being changed by advanced home [facility]. The area over the right greater trochanter is also continued to improve she is eating well and undergoing therapeutic positioning by her granddaughter with regularity 12/18/15; the patient arrives with her daughter and granddaughter the latter of which is the primary caregiver. She continues with the wound VAC with collagen under the foam. She is apparently eating well and they are religiously offloading this area 01/01/16; this is a patient I follow every 2 weeks. She has severe dementia and developed a stage IV pressure ulcer over her lower sacrum and coccyx. In spite of the fact that her Alzheimer's disease is advanced, we have had excellent results using collagen under the foam of wound VAC. She has also had [name], [name] ([id number]) meticulous care for predominantly a granddaughter at home. 01/15/16; I follow this lady every 2 weeks for what was a central stage IV pressure wound over her lower sacrum and coccyx. In spite of advanced Alzheimer's disease in mobility and probably some degree of protein calorie malnutrition she is actually had an excellent result to a wound VAC with silver collagen under the VAC foam. She continues to do well the wound is a lot smaller. The tunneling that I was concerned about last week has resolved although there is still undermining superiorly from roughly 10 to 2:00 6/2 28/17; I follow this lady every 2 weeks for what was a stage IV pressure wound over her lower sacrum and coccyx. In spite of advanced Alzheimer's disease, relatively immobile state she has done exceptionally well with a wound VAC care. She still has undermining from 10 to 2:00  however the base of this is healthy and the orifice is really too small now to continue wound VAC. 02/12/16: Pressure ulcer continues to improve. she is accompanied by her daughter and granddaughter who take excellent care of her. Electronic Signature(s) Signed: 02/13/2016 5:22:48 PM By: Georges Lynch FNP Entered By: Georges Lynch on 02/12/2016 16:52:49 Wiegel, [name] [name] ([id number]) -------------------------------------------------------------------------------- Physical Exam Details Patient Name: [name] [name] B. Date of Service: 02/12/2016 3:45 PM Medical Record Number: [id number] Patient Account Number: [id number] Date of Birth/Sex: 1936-06-06 (80 y.o. Female) Treating RN: Leonard Downing Primary Care Physician: [name] Other Clinician: Referring Physician: [name] Treating Physician/Extender: Eugene Garnet in Treatment: 17 Constitutional frail chronically ill appearing elderly female in NAD. Ears, Nose, Mouth, and Throat Patient can hear normal speaking tones without difficulty.Marland Kitchen Respiratory Respiratory effort is easy and symmetric bilaterally. Rate is normal at rest and on room air.. Integumentary (Hair, Skin) [name] 4 coccyx PU with 100% red healthy granulation tissue overlying the wound bed. no foul odor. no periwound erythema. no induration or fluctuance.Marland Kitchen Psychiatric poor secondary to mental status. alert. oriented to person. No evidence of depression, anxiety, or agitation. Calm, cooperative, and communicative. Appropriate interactions and affect.. Electronic Signature(s) Signed: 02/13/2016 5:22:48 PM By: Georges Lynch FNP Entered By: Georges Lynch on 02/12/2016 16:54:20 Drollinger, [name] [name] ([id number]) -------------------------------------------------------------------------------- Physician Orders Details Patient Name: [name] [name] B. Date of Service: 02/12/2016 3:45 PM Medical Record Number: [id number] Patient Account Number:  [id number] Date of Birth/Sex: 08-21-1935 (80 y.o. Female) Treating RN: Leonard Downing Primary  Care Physician: [name] Other Clinician: Referring Physician: [name] Treating Physician/Extender: Eugene Garnet in Treatment: 17 Verbal / Phone Orders: Yes Clinician: Leonard Downing Read Back and Verified: Yes Diagnosis Coding Wound Cleansing Wound #1 Sacrum o Clean wound with Normal Saline. Skin Barriers/Peri-Wound Care Wound #1 Sacrum o Barrier cream Primary Wound Dressing Wound #1 Sacrum o Prisma Ag Secondary Dressing Wound #1 Sacrum o Boardered Foam Dressing Dressing Change Frequency Wound #1 Sacrum o Other: - Twice a week Follow-up Appointments Wound #1 Sacrum o Return Appointment in 2 weeks. Off-Loading Wound #1 Sacrum o Turn and reposition every 2 hours o Mattress - Hospital bed in place Additional Orders / Instructions Wound #1 Sacrum o Increase protein intake. o Other: - Supplements: Vitamin C, MVI, Zinc Klunk, [name] [name] ([id number]) Home Health Wound #1 Sacrum o Continue Home [facility] Visits - [facility] o Home Health Nurse may visit PRN to address patientos wound care needs. o FACE TO FACE ENCOUNTER: MEDICARE and MEDICAID PATIENTS: I certify that this patient is under my care and that I had a face-to-face encounter that meets the physician face-to-face encounter requirements with this patient on this date. The encounter with the patient was in whole or in part for the following MEDICAL CONDITION: (primary reason for Home Healthcare) MEDICAL NECESSITY: I certify, that based on my findings, NURSING services are a medically necessary home health service. HOME BOUND STATUS: I certify that my clinical findings support that this patient is homebound (i.e., Due to illness or injury, pt requires aid of supportive devices such as crutches, cane, wheelchairs, walkers, the use of special transportation or the  assistance of another person to leave their place of residence. There is a normal inability to leave the home and doing so requires considerable and taxing effort. Other absences are for medical reasons / religious services and are infrequent or of short duration when for other reasons). o If current dressing causes regression in wound condition, may D/C ordered dressing product/s and apply Normal Saline Moist Dressing daily until next Wound Healing Center / Other MD appointment. Notify Wound Healing Center of regression in wound condition at [phone]. o Please direct any NON-WOUND related issues/requests for orders to patient's Primary Care Physician Negative Pressure Wound Therapy Wound #1 Sacrum o Discontinue NPWT. Electronic Signature(s) Signed: 02/12/2016 4:17:03 PM By: Maureen Chatters Signed: 02/13/2016 5:22:48 PM By: Georges Lynch FNP Entered By: Lucrezia Starch RN, Sendra on 02/12/2016 16:11:11 Wangerin, [name] [name] ([id number]) -------------------------------------------------------------------------------- Problem List Details Patient Name: [name] [name] B. Date of Service: 02/12/2016 3:45 PM Medical Record Number: [id number] Patient Account Number: [id number] Date of Birth/Sex: 04-11-1936 (80 y.o. Female) Treating RN: Leonard Downing Primary Care Physician: [name] Other Clinician: Referring Physician: [name] Treating Physician/Extender: Eugene Garnet in Treatment: 17 Active Problems ICD-10 Encounter Code Description Active Date Diagnosis L89.154 Pressure ulcer of sacral region, stage 4 10/15/2015 Yes E43 Unspecified severe protein-calorie malnutrition 10/15/2015 Yes Inactive Problems Resolved Problems ICD-10 Code Description Active Date Resolved Date L03.317 Cellulitis of buttock 10/15/2015 10/15/2015 L89.213 Pressure ulcer of right hip, stage 3 10/15/2015 10/15/2015 Electronic Signature(s) Signed: 02/13/2016 5:22:48 PM By: Georges Lynch FNP Entered By: Georges Lynch on 02/12/2016 16:51:47 Rager, [name] [name] ([id number]) -------------------------------------------------------------------------------- Progress Note Details Patient Name: [name] [name] B. Date of Service: 02/12/2016 3:45 PM Medical Record Number: [id number] Patient Account Number: [id number] Date of Birth/Sex: October 17, 1935 (80 y.o. Female) Treating RN: Leonard Downing Primary Care Physician: [name] Other Clinician: Referring Physician: [name] Treating Physician/Extender: Eugene Garnet  in Treatment: 17 Subjective Chief Complaint Information obtained from Patient The patient is here for a sacral decubitus ulcer also a more superficial wound over her right hip History of Present Illness (HPI) 10/15/15; this is a very frail lady with advanced Alzheimer's disease who is being cared for aerobically at home by her daughter and granddaughter. She is essentially nonambulatory and seems to be minimally verbal. They tell me that she started to develop a pressure area in her sacral area in January. Initially a very small open area that progressed up. There is also a more recent area on the right hip. The patient was seen in her primary office for this problem on 2/23 at that point noted to have a stage III wound with tunneling over the sacrum. There were 2 other areas noted on the upper left and right bilateral occipital. As well as a stage II on the right trochanter. She was referred to the ER because of tachycardia. She was hospitalized from 2/23 through 2/25. The admission this seems to open dominated by a distal fecal impaction. She was noted to have sinus tachycardia which improved. She did have a CT scan of the abdomen and pelvis during the hospitalization. In addition to the fecal impaction this noted the ulcer in the left lower gluteal region adjacent to the midline. There was air in the subcutaneous wound but no evidence of an  air-fluid level to suggest an abscess. There was no comment about osteomyelitis. Since her return home she has not been eating well but is drinking. The family provides total care. I note that her albumin was 3 on admission to the hospital. Sodium got as high as 148 but was normalized by the time she left 10/24/15; this is a patient who has advanced dementia. She has a deep probing stage IV pressure ulcer over her lower sacral area. Cultures of this last week grew both Proteus and methicillin-resistant staph aureus. I and empirically put her on Cipro and doxycycline which should cover both of these. He also has a wound over her right greater trochanter. She is been followed by home health at home they're requesting lab work which I think is reasonable. I ordered a CMP, CBC with differential, sedimentation rate and a C-reactive protein 10/30/15 the patient's labs were done by home health but I think they faxed the orders to the primary physician's office. The plain x-ray I did did not show osteomyelitis however it did show fractures of the right superior and inferior pubic rami which are still not fully healed. I informed the patient's family of this. They do give a history of a fall while she was at an assisted living in [place] in November afterward she had some trouble walking this may have been the issue. 11/13/15 the wound VAC only started 6 days ago. She is apparently eating well 11/20/15; the patient continues with the wound VAC without any difficulties. This is being changed by home health. She is apparently eating well 12/04/15; the patient continues with a wound VAC without any difficulties. They're using collagen under the foam being changed by [facility]. The area over the right greater trochanter is also continued to improve she is eating well and undergoing therapeutic positioning by her granddaughter with regularity [name] [name] DINI ([id number]) 12/18/15; the patient arrives with  her daughter and granddaughter the latter of which is the primary caregiver. She continues with the wound VAC with collagen under the foam. She is apparently eating well and they are  religiously offloading this area 01/01/16; this is a patient I follow every 2 weeks. She has severe dementia and developed a stage IV pressure ulcer over her lower sacrum and coccyx. In spite of the fact that her Alzheimer's disease is advanced, we have had excellent results using collagen under the foam of wound VAC. She has also had meticulous care for predominantly a granddaughter at home. 01/15/16; I follow this lady every 2 weeks for what was a central stage IV pressure wound over her lower sacrum and coccyx. In spite of advanced Alzheimer's disease in mobility and probably some degree of protein calorie malnutrition she is actually had an excellent result to a wound VAC with silver collagen under the VAC foam. She continues to do well the wound is a lot smaller. The tunneling that I was concerned about last week has resolved although there is still undermining superiorly from roughly 10 to 2:00 6/2 28/17; I follow this lady every 2 weeks for what was a stage IV pressure wound over her lower sacrum and coccyx. In spite of advanced Alzheimer's disease, relatively immobile state she has done exceptionally well with a wound VAC care. She still has undermining from 10 to 2:00 however the base of this is healthy and the orifice is really too small now to continue wound VAC. 02/12/16: Pressure ulcer continues to improve. she is accompanied by her daughter and granddaughter who take excellent care of her. no reported fevers or evidence of chills. Objective Constitutional frail chronically ill appearing elderly female in NAD. Ears, Nose, Mouth, and Throat Patient can hear normal speaking tones without difficulty.Marland Kitchen. Respiratory Respiratory effort is easy and symmetric bilaterally. Rate is normal at rest and on room  air.Marland Kitchen. Psychiatric poor secondary to mental status. alert. oriented to person. No evidence of depression, anxiety, or agitation. Calm, cooperative, and communicative. Appropriate interactions and affect.. Integumentary (Hair, Skin) [name] 4 coccyx PU with 100% red healthy granulation tissue overlying the wound bed. no foul odor. no periwound erythema. no induration or fluctuance.. Wound #1 status is Open. Original cause of wound was Gradually Appeared. The wound is located on the Sacrum. The wound measures 1.5cm length x 1.1cm width x 0.5cm depth; 1.296cm^2 area and 0.648cm^3 [name] [name] B. ([id number]) volume. The wound is limited to skin breakdown. There is no tunneling or undermining noted. There is a large amount of serosanguineous drainage noted. The wound margin is thickened. There is large (67- 100%) red granulation within the wound bed. There is a small (1-33%) amount of necrotic tissue within the wound bed including Adherent Slough. The periwound skin appearance exhibited: Moist. The periwound skin appearance did not exhibit: Callus, Crepitus, Excoriation, Fluctuance, Friable, Induration, Localized Edema, Rash, Scarring, Dry/Scaly, Maceration, Atrophie Blanche, Cyanosis, Ecchymosis, Hemosiderin Staining, Mottled, Pallor, Rubor, Erythema. Periwound temperature was noted as No Abnormality. The periwound has tenderness on palpation. Assessment Active Problems ICD-10 L89.154 - Pressure ulcer of sacral region, stage 4 E43 - Unspecified severe protein-calorie malnutrition Diagnoses ICD-10 L89.154: Pressure ulcer of sacral region, stage 4 L03.317: Cellulitis of buttock E43: Unspecified severe protein-calorie malnutrition L89.213: Pressure ulcer of right hip, stage 3 Plan Wound Cleansing: Wound #1 Sacrum: Clean wound with Normal Saline. Skin Barriers/Peri-Wound Care: Wound #1 Sacrum: Barrier cream Primary Wound Dressing: Wound #1 Sacrum: Prisma Ag Secondary Dressing: Wound #1  Sacrum: Boardered Foam Dressing Dressing Change Frequency: Wound #1 Sacrum: Other: - Twice a week Follow-up Appointments: [name] [name] B. ([id number]) Wound #1 Sacrum: Return Appointment in 2 weeks. Off-Loading: Wound #1 Sacrum: Turn  and reposition every 2 hours Mattress [facility] bed in place Additional Orders / Instructions: Wound #1 Sacrum: Increase protein intake. Other: - Supplements: Vitamin C, MVI, Zinc Home Health: Wound #1 Sacrum: Continue [facility] Visits - [facility] Home Health Nurse may visit PRN to address patient s wound care needs. FACE TO FACE ENCOUNTER: MEDICARE and MEDICAID PATIENTS: I certify that this patient is under my care and that I had a face-to-face encounter that meets the physician face-to-face encounter requirements with this patient on this date. The encounter with the patient was in whole or in part for the following MEDICAL CONDITION: (primary reason for Home Healthcare) MEDICAL NECESSITY: I certify, that based on my findings, NURSING services are a medically necessary home health service. HOME BOUND STATUS: I certify that my clinical findings support that this patient is homebound (i.e., Due to illness or injury, pt requires aid of supportive devices such as crutches, cane, wheelchairs, walkers, the use of special transportation or the assistance of another person to leave their place of residence. There is a normal inability to leave the home and doing so requires considerable and taxing effort. Other absences are for medical reasons / religious services and are infrequent or of short duration when for other reasons). If current dressing causes regression in wound condition, may D/C ordered dressing product/s and apply Normal Saline Moist Dressing daily until next Wound Healing Center / Other MD appointment. Notify Wound Healing Center of regression in wound condition at [phone]. Please direct any NON-WOUND related  issues/requests for orders to patient's Primary Care Physician Negative Pressure Wound Therapy: Wound #1 Sacrum: Discontinue NPWT. Follow-Up Appointments: A follow-up appointment should be scheduled. Medication Reconciliation completed and provided to Patient/Care Provider. A Patient Clinical Summary of Care was provided to [facility] Electronic Signature(s) Signed: 02/13/2016 5:22:48 PM By: Georges Lynch FNP Entered By: Georges Lynch on 02/13/2016 16:10:24 Clyne, [name] [name] ([id number]) -------------------------------------------------------------------------------- SuperBill Details Patient Name: [name] [name] Stacks B. Date of Service: 02/12/2016 Medical Record Number: [id number] Patient Account Number: [id number] Date of Birth/Sex: 1935-09-17 (80 y.o. Female) Treating RN: Leonard Downing Primary Care Physician: [name] Other Clinician: Referring Physician: [name] Treating Physician/Extender: Eugene Garnet in Treatment: 17 Diagnosis Coding ICD-10 Codes Code Description L89.154 Pressure ulcer of sacral region, stage 4 L03.317 Cellulitis of buttock E43 Unspecified severe protein-calorie malnutrition L89.213 Pressure ulcer of right hip, stage 3 Facility Procedures CPT4 Code: [id number] Description: 99213 - WOUND CARE VISIT-LEV 3 EST PT Modifier: Quantity: 1 Physician Procedures CPT4 Code: [id number] Description: 99213 - WC PHYS LEVEL 3 - EST PT ICD-10 Description Diagnosis L89.154 Pressure ulcer of sacral region, stage 4 L03.317 Cellulitis of buttock E43 Unspecified severe protein-calorie malnutritio Modifier: n Quantity: 1 Electronic Signature(s) Signed: 02/13/2016 5:22:48 PM By: Georges Lynch FNP Previous Signature: 02/12/2016 4:17:03 PM Version By: Lucrezia Starch RN, Sendra Entered By: Georges Lynch on 02/13/2016 16:10:48

## 2016-02-16 DIAGNOSIS — L89159 Pressure ulcer of sacral region, unspecified stage: Secondary | ICD-10-CM | POA: Diagnosis not present

## 2016-02-17 DIAGNOSIS — G309 Alzheimer's disease, unspecified: Secondary | ICD-10-CM | POA: Diagnosis not present

## 2016-02-17 DIAGNOSIS — L89210 Pressure ulcer of right hip, unstageable: Secondary | ICD-10-CM | POA: Diagnosis not present

## 2016-02-17 DIAGNOSIS — I10 Essential (primary) hypertension: Secondary | ICD-10-CM | POA: Diagnosis not present

## 2016-02-17 DIAGNOSIS — L89620 Pressure ulcer of left heel, unstageable: Secondary | ICD-10-CM | POA: Diagnosis not present

## 2016-02-17 DIAGNOSIS — L89150 Pressure ulcer of sacral region, unstageable: Secondary | ICD-10-CM | POA: Diagnosis not present

## 2016-02-17 DIAGNOSIS — R000 Tachycardia, unspecified: Secondary | ICD-10-CM | POA: Diagnosis not present

## 2016-02-17 DIAGNOSIS — E785 Hyperlipidemia, unspecified: Secondary | ICD-10-CM | POA: Diagnosis not present

## 2016-02-20 DIAGNOSIS — E785 Hyperlipidemia, unspecified: Secondary | ICD-10-CM | POA: Diagnosis not present

## 2016-02-20 DIAGNOSIS — L89150 Pressure ulcer of sacral region, unstageable: Secondary | ICD-10-CM | POA: Diagnosis not present

## 2016-02-20 DIAGNOSIS — L89154 Pressure ulcer of sacral region, stage 4: Secondary | ICD-10-CM | POA: Diagnosis not present

## 2016-02-20 DIAGNOSIS — R000 Tachycardia, unspecified: Secondary | ICD-10-CM | POA: Diagnosis not present

## 2016-02-20 DIAGNOSIS — L89210 Pressure ulcer of right hip, unstageable: Secondary | ICD-10-CM | POA: Diagnosis not present

## 2016-02-20 DIAGNOSIS — I10 Essential (primary) hypertension: Secondary | ICD-10-CM | POA: Diagnosis not present

## 2016-02-20 DIAGNOSIS — G309 Alzheimer's disease, unspecified: Secondary | ICD-10-CM | POA: Diagnosis not present

## 2016-02-20 DIAGNOSIS — L89620 Pressure ulcer of left heel, unstageable: Secondary | ICD-10-CM | POA: Diagnosis not present

## 2016-02-24 DIAGNOSIS — L89210 Pressure ulcer of right hip, unstageable: Secondary | ICD-10-CM | POA: Diagnosis not present

## 2016-02-24 DIAGNOSIS — R000 Tachycardia, unspecified: Secondary | ICD-10-CM | POA: Diagnosis not present

## 2016-02-24 DIAGNOSIS — G309 Alzheimer's disease, unspecified: Secondary | ICD-10-CM | POA: Diagnosis not present

## 2016-02-24 DIAGNOSIS — L89620 Pressure ulcer of left heel, unstageable: Secondary | ICD-10-CM | POA: Diagnosis not present

## 2016-02-24 DIAGNOSIS — L89150 Pressure ulcer of sacral region, unstageable: Secondary | ICD-10-CM | POA: Diagnosis not present

## 2016-02-24 DIAGNOSIS — I10 Essential (primary) hypertension: Secondary | ICD-10-CM | POA: Diagnosis not present

## 2016-02-24 DIAGNOSIS — E785 Hyperlipidemia, unspecified: Secondary | ICD-10-CM | POA: Diagnosis not present

## 2016-02-26 ENCOUNTER — Ambulatory Visit: Payer: Medicare Other | Admitting: Internal Medicine

## 2016-02-27 DIAGNOSIS — L89620 Pressure ulcer of left heel, unstageable: Secondary | ICD-10-CM | POA: Diagnosis not present

## 2016-02-27 DIAGNOSIS — R000 Tachycardia, unspecified: Secondary | ICD-10-CM | POA: Diagnosis not present

## 2016-02-27 DIAGNOSIS — L89150 Pressure ulcer of sacral region, unstageable: Secondary | ICD-10-CM | POA: Diagnosis not present

## 2016-02-27 DIAGNOSIS — G309 Alzheimer's disease, unspecified: Secondary | ICD-10-CM | POA: Diagnosis not present

## 2016-02-27 DIAGNOSIS — I10 Essential (primary) hypertension: Secondary | ICD-10-CM | POA: Diagnosis not present

## 2016-02-27 DIAGNOSIS — E785 Hyperlipidemia, unspecified: Secondary | ICD-10-CM | POA: Diagnosis not present

## 2016-02-27 DIAGNOSIS — L89210 Pressure ulcer of right hip, unstageable: Secondary | ICD-10-CM | POA: Diagnosis not present

## 2016-02-28 DIAGNOSIS — L89150 Pressure ulcer of sacral region, unstageable: Secondary | ICD-10-CM | POA: Diagnosis not present

## 2016-03-02 DIAGNOSIS — G309 Alzheimer's disease, unspecified: Secondary | ICD-10-CM | POA: Diagnosis not present

## 2016-03-02 DIAGNOSIS — R000 Tachycardia, unspecified: Secondary | ICD-10-CM | POA: Diagnosis not present

## 2016-03-02 DIAGNOSIS — I10 Essential (primary) hypertension: Secondary | ICD-10-CM | POA: Diagnosis not present

## 2016-03-02 DIAGNOSIS — L89210 Pressure ulcer of right hip, unstageable: Secondary | ICD-10-CM | POA: Diagnosis not present

## 2016-03-02 DIAGNOSIS — E785 Hyperlipidemia, unspecified: Secondary | ICD-10-CM | POA: Diagnosis not present

## 2016-03-02 DIAGNOSIS — L89150 Pressure ulcer of sacral region, unstageable: Secondary | ICD-10-CM | POA: Diagnosis not present

## 2016-03-02 DIAGNOSIS — L89620 Pressure ulcer of left heel, unstageable: Secondary | ICD-10-CM | POA: Diagnosis not present

## 2016-03-04 ENCOUNTER — Encounter: Payer: Medicare Other | Attending: Internal Medicine | Admitting: Internal Medicine

## 2016-03-04 DIAGNOSIS — Z880 Allergy status to penicillin: Secondary | ICD-10-CM | POA: Diagnosis not present

## 2016-03-04 DIAGNOSIS — L89154 Pressure ulcer of sacral region, stage 4: Secondary | ICD-10-CM | POA: Insufficient documentation

## 2016-03-04 DIAGNOSIS — Z87891 Personal history of nicotine dependence: Secondary | ICD-10-CM | POA: Insufficient documentation

## 2016-03-04 DIAGNOSIS — D649 Anemia, unspecified: Secondary | ICD-10-CM | POA: Insufficient documentation

## 2016-03-04 DIAGNOSIS — M1990 Unspecified osteoarthritis, unspecified site: Secondary | ICD-10-CM | POA: Insufficient documentation

## 2016-03-04 DIAGNOSIS — G309 Alzheimer's disease, unspecified: Secondary | ICD-10-CM | POA: Diagnosis not present

## 2016-03-04 DIAGNOSIS — E43 Unspecified severe protein-calorie malnutrition: Secondary | ICD-10-CM | POA: Insufficient documentation

## 2016-03-04 DIAGNOSIS — I10 Essential (primary) hypertension: Secondary | ICD-10-CM | POA: Diagnosis not present

## 2016-03-04 DIAGNOSIS — I252 Old myocardial infarction: Secondary | ICD-10-CM | POA: Insufficient documentation

## 2016-03-04 DIAGNOSIS — L89153 Pressure ulcer of sacral region, stage 3: Secondary | ICD-10-CM | POA: Diagnosis not present

## 2016-03-04 DIAGNOSIS — F0280 Dementia in other diseases classified elsewhere without behavioral disturbance: Secondary | ICD-10-CM | POA: Insufficient documentation

## 2016-03-05 NOTE — Progress Notes (Signed)
Sumler, Ariea B. ([id number]) Visit Report for 03/04/2016 Arrival Information Details Patient Name: Hillock, STEPHENY B. Date of Service: 03/04/2016 3:45 PM Medical Record Patient Account Number: [id number] Number: Treating RN: Huel Coventry January 26, 1936 (80 y.o. Other Clinician: Date of Birth/Sex: Female) Treating ROBSON, [name] Primary Care Physician: [name] Physician/Extender: G Referring Physician: Thedore Mins, [name] Weeks in Treatment: 20 Visit Information History Since Last Visit Added or deleted any medications: No Patient Arrived: Wheel Chair Any new allergies or adverse No Arrival Time: 15:49 reactions: Accompanied By: aughter and Had a fall or experienced change in No granddaughter activities of daily living that may Transfer Assistance: Manual affect Patient Identification Verified: Yes risk of falls: Secondary Verification Yes Signs or symptoms of abuse/neglect No Process Completed: since last visito Patient Requires No Hospitalized since last visit: No Transmission-Based Has Dressing in Place as Yes Precautions: Prescribed: Patient Has Alerts: No Pain Present Now: Unable to Respond Electronic Signature(s) Signed: 03/05/2016 8:02:19 AM By: Elliot Gurney, RN, [name], Kim RN, [name] Entered By: Elliot Gurney, RN, [name], Kim on 03/04/2016 15:50:34 Chiong, [name] ([id number]) -------------------------------------------------------------------------------- Clinic Level of Care Assessment Details Patient Name: Peden, Noami B. Date of Service: 03/04/2016 3:45 PM Medical Record Patient Account Number: [id number] Number: Treating RN: Huel Coventry 04-10-1936 (80 y.o. Other Clinician: Date of Birth/Sex: Female) Treating ROBSON, [name] Primary Care Physician: [name] Physician/Extender: G Referring Physician: Thedore Mins, [name] Weeks in Treatment: 20 Clinic Level of Care Assessment Items TOOL 4 Quantity Score  - Use when only an EandM is performed on FOLLOW-UP  visit 0 ASSESSMENTS - Nursing Assessment / Reassessment  - Reassessment of Co-morbidities (includes updates in patient status) 0 X - Reassessment of Adherence to Treatment Plan 1 5 ASSESSMENTS - Wound and Skin AssessmMIALANI REICKS Reassessment X - Simple Wound Assessment / Reassessment - one wound 1 5  - Complex Wound Assessment / Reassessment - multiple wounds 0  - Dermatologic / Skin Assessment (not related to wound area) 0 ASSESSMENTS - Focused Assessment  - Circumferential Edema Measurements - multi extremities 0  - Nutritional Assessment / Counseling / Intervention 0  - Lower Extremity Assessment (monofilament, tuning fork, pulses) 0  - Peripheral Arterial Disease Assessment (using hand held doppler) 0 ASSESSMENTS - Ostomy and/or Continence Assessment and Care  - Incontinence Assessment and Management 0  - Ostomy Care Assessment and Management (repouching, etc.) 0 PROCESS - Coordination of Care X - Simple Patient / Family Education for ongoing care 1 15  - Complex (extensive) Patient / Family Education for ongoing care 0 X - Staff obtains Chiropractor, Records, Test Results / Process Orders 1 10  - Staff telephones HHA, Nursing Homes / Clarify orders / etc 0 Yeats, Ricki B. ([id number])  - Routine Transfer to another Facility (non-emergent condition) 0  - Routine Hospital Admission (non-emergent condition) 0  - New Admissions / Manufacturing engineer / Ordering NPWT, Apligraf, etc. 0  - Emergency Hospital Admission (emergent condition) 0 X - Simple Discharge Coordination 1 10  - Complex (extensive) Discharge Coordination 0 PROCESS - Special Needs  - Pediatric / Minor Patient Management 0  - Isolation Patient Management 0  - Hearing / Language / Visual special needs 0  - Assessment of Community assistance (transportation, D/C planning, etc.) 0  - Additional assistance / Altered mentation 0  - Support Surface(s) Assessment (bed, cushion, seat,  etc.) 0 INTERVENTIONS - Wound Cleansing / Measurement X - Simple Wound Cleansing - one wound 1 5  - Complex Wound Cleansing - multiple wounds 0 X -  Wound Imaging (photographs - any number of wounds) 1 5 []  - Wound Tracing (instead of photographs) 0 X - Simple Wound Measurement - one wound 1 5 []  - Complex Wound Measurement - multiple wounds 0 INTERVENTIONS - Wound Dressings X - Small Wound Dressing one or multiple wounds 1 10 []  - Medium Wound Dressing one or multiple wounds 0 []  - Large Wound Dressing one or multiple wounds 0 []  - Application of Medications - topical 0 []  - Application of Medications - injection 0 Yono, Tanita B. ([id number]) INTERVENTIONS - Miscellaneous []  - External ear exam 0 []  - Specimen Collection (cultures, biopsies, blood, body fluids, etc.) 0 []  - Specimen(s) / Culture(s) sent or taken to Lab for analysis 0 []  - Patient Transfer (multiple staff / Michiel Sites Lift / Similar devices) 0 []  - Simple Staple / Suture removal (25 or less) 0 []  - Complex Staple / Suture removal (26 or more) 0 []  - Hypo / Hyperglycemic Management (close monitor of Blood Glucose) 0 []  - Ankle / Brachial Index (ABI) - do not check if billed separately 0 X - Vital Signs 1 5 Has the patient been seen at the hospital within the last three years: Yes Total Score: 75 Level Of Care: New/Established - Level 2 Electronic Signature(s) Signed: 03/05/2016 8:02:19 AM By: Elliot Gurney, RN, [name], Kim RN, [name] Entered By: Elliot Gurney, RN, [name], Kim on 03/04/2016 16:02:29 Klepacki, [name] ([id number]) -------------------------------------------------------------------------------- Encounter Discharge Information Details Patient Name: Pizzimenti, [name] Date of Service: 03/04/2016 3:45 PM Medical Record Patient Account Number: [id number] Number: Treating RN: Huel Coventry 1936/05/04 (80 y.o. Other Clinician: Date of Birth/Sex: Female) Treating ROBSON, [name] Primary Care Physician: [name] Physician/Extender: G Referring Physician: Thedore Mins, [name] Weeks in Treatment: 20 Encounter Discharge Information Items Discharge Pain Level: 0 Discharge Condition: Unstable Ambulatory Status: Wheelchair Discharge Destination: Home Transportation: Private Auto daughter and Accompanied By: granddaughter Schedule Follow-up Appointment: Yes Medication Reconciliation completed and provided to Yes Patient/Care Blain Hunsucker: Provided on Clinical Summary of Care: 03/04/2016 Form Type Recipient Paper Patient Ann Maki Electronic Signature(s) Signed: 03/04/2016 4:05:19 PM By: Gwenlyn Perking Entered By: Gwenlyn Perking on 03/04/2016 16:05:18 Stockman, [name] ([id number]) -------------------------------------------------------------------------------- Multi Wound Chart Details Patient Name: Mcquain, [name] Date of Service: 03/04/2016 3:45 PM Medical Record Patient Account Number: [id number] Number: Treating RN: Huel Coventry 1936/02/20 (80 y.o. Other Clinician: Date of Birth/Sex: Female) Treating ROBSON, [name] Primary Care Physician: [name] Physician/Extender: G Referring Physician: Thedore Mins, [name] Weeks in Treatment: 20 Vital Signs Height(in): 63 Pulse(bpm): 69 Weight(lbs): Blood Pressure 75/59 (mmHg): Body Mass Index(BMI): Temperature(F): Respiratory Rate 18 (breaths/min): Photos: [1:No Photos] [N/A:N/A] Wound Location: [1:Sacrum] [N/A:N/A] Wounding Event: [1:Gradually Appeared] [N/A:N/A] Primary Etiology: [1:Pressure Ulcer] [N/A:N/A] Comorbid History: [1:Cataracts, Anemia, Arrhythmia, Hypertension, Myocardial Infarction, History of pressure wounds, Osteoarthritis, Dementia] [N/A:N/A] Date Acquired: [1:09/10/2015] [N/A:N/A] Weeks of Treatment: [1:20] [N/A:N/A] Wound Status: [1:Open] [N/A:N/A] Measurements L x W x D 1.2x0.8x0.2 [N/A:N/A] (cm) Area (cm) : [1:0.754] [N/A:N/A] Volume (cm) : [1:0.151] [N/A:N/A] % Reduction in Area: [1:95.00%] [N/A:N/A] %  Reduction in Volume: 99.80% [N/A:N/A] Classification: [1:Category/Stage IV] [N/A:N/A] Exudate Amount: [1:Large] [N/A:N/A] Exudate Type: [1:Serosanguineous] [N/A:N/A] Exudate Color: [1:red, brown] [N/A:N/A] Wound Margin: [1:Thickened] [N/A:N/A] Granulation Amount: [1:Large (67-100%)] [N/A:N/A] Granulation Quality: [1:Red, Hyper-granulation] [N/A:N/A] Necrotic Amount: [1:None Present (0%)] [N/A:N/A] Exposed Structures: [N/A:N/A] Fascia: No Fat: No Tendon: No Muscle: No Joint: No Bone: No Limited to Skin Breakdown Epithelialization: None N/A N/A Periwound Skin Texture: Edema: No N/A N/A Excoriation: No Induration: No Callus: No Crepitus: No Fluctuance: No Friable:  No Rash: No Scarring: No Periwound Skin Maceration: No N/A N/A Moisture: Moist: No Dry/Scaly: No Periwound Skin Color: Atrophie Blanche: No N/A N/A Cyanosis: No Ecchymosis: No Erythema: No Hemosiderin Staining: No Mottled: No Pallor: No Rubor: No Temperature: No Abnormality N/A N/A Tenderness on Yes N/A N/A Palpation: Wound Preparation: Ulcer Cleansing: N/A N/A Rinsed/Irrigated with Saline Topical Anesthetic Applied: None Treatment Notes Electronic Signature(s) Signed: 03/05/2016 8:02:19 AM By: Elliot Gurney, RN, [name], Kim RN, [name] Entered By: Elliot Gurney, RN, [name], Kim on 03/04/2016 15:55:41 Rozzell, [name] ([id number]) -------------------------------------------------------------------------------- Multi-Disciplinary Care Plan Details Patient Name: Fergeson, [name] Date of Service: 03/04/2016 3:45 PM Medical Record Patient Account Number: [id number] Number: Treating RN: Huel Coventry 1935/10/12 (80 y.o. Other Clinician: Date of Birth/Sex: Female) Treating ROBSON, [name] Primary Care Physician: [name] Physician/Extender: G Referring Physician: Thedore Mins, [name] Weeks in Treatment: 20 Active Inactive Abuse / Safety / Falls / Self Care Management Nursing Diagnoses: Impaired home  maintenance Impaired physical mobility Knowledge deficit related to: safety; personal, health (wound), emergency Potential for falls Self care deficit: actual or potential Goals: Patient will remain injury free Date Initiated: 10/15/2015 Goal Status: Active Patient/caregiver will verbalize understanding of skin care regimen Date Initiated: 10/15/2015 Goal Status: Active Patient/caregiver will verbalize/demonstrate measure taken to improve self care Date Initiated: 10/15/2015 Goal Status: Active Patient/caregiver will verbalize/demonstrate measures taken to improve the patient's personal safety Date Initiated: 10/15/2015 Goal Status: Active Patient/caregiver will verbalize/demonstrate measures taken to prevent injury and/or falls Date Initiated: 10/15/2015 Goal Status: Active Patient/caregiver will verbalize/demonstrate understanding of what to do in case of emergency Date Initiated: 10/15/2015 Goal Status: Active Interventions: Assess fall risk on admission and as needed Assess: immobility, friction, shearing, incontinence upon admission and as needed Assess impairment of mobility on admission and as needed per policy Carvin, Kierra B. ([id number]) Assess self care needs on admission and as needed Provide education on basic hygiene Provide education on fall prevention Provide education on personal and home safety Provide education on safe transfers Treatment Activities: Education provided on Basic Hygiene : 01/29/2016 Patient referred to home care : 11/20/2015 Notes: Nutrition Nursing Diagnoses: Imbalanced nutrition Impaired glucose control: actual or potential Potential for alteratiion in Nutrition/Potential for imbalanced nutrition Goals: Patient/caregiver agrees to and verbalizes understanding of need to obtain nutritional consultation Date Initiated: 10/15/2015 Goal Status: Active Patient/caregiver agrees to and verbalizes understanding of need to use nutritional supplements  and/or vitamins as prescribed Date Initiated: 10/15/2015 Goal Status: Active Patient/caregiver verbalizes understanding of need to maintain therapeutic glucose control per primary care physician Date Initiated: 10/15/2015 Goal Status: Active Interventions: Assess patient nutrition upon admission and as needed per policy Provide education on elevated blood sugars and impact on wound healing Provide education on nutrition Treatment Activities: Education provided on Nutrition : 01/29/2016 Notes: Orientation to the Wound Care Program Nursing Diagnoses: Knowledge deficit related to the wound healing center program Rundquist, [facility] B. ([id number]) Goals: Patient/caregiver will verbalize understanding of the Wound Healing Center Program Date Initiated: 10/15/2015 Goal Status: Active Interventions: Provide education on orientation to the wound center Notes: Pressure Nursing Diagnoses: Knowledge deficit related to causes and risk factors for pressure ulcer development Knowledge deficit related to management of pressures ulcers Potential for impaired tissue integrity related to pressure, friction, moisture, and shear Goals: Patient will remain free from development of additional pressure ulcers Date Initiated: 10/15/2015 Goal Status: Active Patient will remain free of pressure ulcers Date Initiated: 10/15/2015 Goal Status: Active Patient/caregiver will verbalize risk factors for pressure ulcer development Date Initiated: 10/15/2015 Goal  Status: Active Patient/caregiver will verbalize understanding of pressure ulcer management Date Initiated: 10/15/2015 Goal Status: Active Interventions: Assess: immobility, friction, shearing, incontinence upon admission and as needed Assess offloading mechanisms upon admission and as needed Assess potential for pressure ulcer upon admission and as needed Provide education on pressure ulcers Treatment Activities: Patient referred for home evaluation of  offloading devices/mattresses : 11/20/2015 Patient referred for pressure reduction/relief devices : 11/20/2015 Patient referred for seating evaluation to ensure proper offloading : 11/20/2015 Pressure reduction/relief device ordered : 11/20/2015 Notes: Giacomo, [name] ([id number]) Wound/Skin Impairment Nursing Diagnoses: Impaired tissue integrity Knowledge deficit related to smoking impact on wound healing Knowledge deficit related to ulceration/compromised skin integrity Goals: Patient/caregiver will verbalize understanding of skin care regimen Date Initiated: 10/15/2015 Goal Status: Active Ulcer/skin breakdown will have a volume reduction of 30% by week 4 Date Initiated: 10/15/2015 Goal Status: Active Ulcer/skin breakdown will have a volume reduction of 50% by week 8 Date Initiated: 10/15/2015 Goal Status: Active Ulcer/skin breakdown will have a volume reduction of 80% by week 12 Date Initiated: 10/15/2015 Goal Status: Active Ulcer/skin breakdown will heal within 14 weeks Date Initiated: 10/15/2015 Goal Status: Active Interventions: Assess patient/caregiver ability to obtain necessary supplies Assess patient/caregiver ability to perform ulcer/skin care regimen upon admission and as needed Assess ulceration(s) every visit Provide education on ulcer and skin care Treatment Activities: Patient referred to home care : 11/20/2015 Skin care regimen initiated : 11/20/2015 Notes: Electronic Signature(s) Signed: 03/05/2016 8:02:19 AM By: Elliot Gurney, RN, [name], Kim RN, [name] Entered By: Elliot Gurney, RN, [name], Kim on 03/04/2016 15:55:20 Tabbert, [name] ([id number]) -------------------------------------------------------------------------------- Pain Assessment Details Patient Name: Stormont, [name] Date of Service: 03/04/2016 3:45 PM Medical Record Patient Account Number: [id number] Number: Treating RN: Huel Coventry January 20, 1936 (80 y.o. Other Clinician: Date of Birth/Sex: Female) Treating  ROBSON, [name] Primary Care Physician: [name] Physician/Extender: G Referring Physician: Thedore Mins, [name] Weeks in Treatment: 20 Active Problems Location of Pain Severity and Description of Pain Patient Has Paino Patient Unable to Respond Site Locations Pain Management and Medication Current Pain Management: Electronic Signature(s) Signed: 03/05/2016 8:02:19 AM By: Elliot Gurney, RN, [name], Kim RN, [name] Entered By: Elliot Gurney, RN, [name], Kim on 03/04/2016 15:51:02 Cristo, [name] ([id number]) -------------------------------------------------------------------------------- Patient/Caregiver Education Details Patient Name: Plasse, [name] Date of Service: 03/04/2016 3:45 PM Medical Record Patient Account Number: [id number] Number: Treating RN: Huel Coventry 28-Apr-1936 (80 y.o. Other Clinician: Date of Birth/Gender: Female) Treating ROBSON, [name] Primary Care Physician: [name] Physician/Extender: G Referring Physician: [name] Weeks in Treatment: 20 Education Assessment Education Provided To: Patient Education Topics Provided Wound/Skin Impairment: Handouts: Caring for Your Ulcer Methods: Demonstration, Explain/Verbal Responses: State content correctly Electronic Signature(s) Signed: 03/05/2016 8:02:19 AM By: Elliot Gurney, RN, [name], Kim RN, [name] Entered By: Elliot Gurney, RN, [name], Kim on 03/04/2016 16:03:58 Bais, [name] ([id number]) -------------------------------------------------------------------------------- Wound Assessment Details Patient Name: [place], [name] Date of Service: 03/04/2016 3:45 PM Medical Record Patient Account Number: [id number] Number: Treating RN: Huel Coventry 1936-01-22 (80 y.o. Other Clinician: Date of Birth/Sex: Female) Treating ROBSON, [name] Primary Care Physician: [name] Physician/Extender: G Referring Physician: Thedore Mins, [name] Weeks in Treatment: 20 Wound Status Wound Number: 1 Primary Pressure Ulcer Etiology: Wound  Location: Sacrum Wound Open Wounding Event: Gradually Appeared Status: Date Acquired: 09/10/2015 Comorbid Cataracts, Anemia, Arrhythmia, Weeks Of Treatment: 20 History: Hypertension, Myocardial Infarction, Clustered Wound: No History of pressure wounds, Osteoarthritis, Dementia Photos Photo Uploaded By: Elpidio Eric on 03/04/2016 16:17:19 Wound Measurements Length: (cm) 1.2 Width: (cm) 0.8 Depth: (cm) 0.2  Area: (cm) 0.754 Volume: (cm) 0.151 % Reduction in Area: 95% % Reduction in Volume: 99.8% Epithelialization: None Wound Description Classification: Category/Stage IV Foul Odor Aft Wound Margin: Thickened Exudate Amount: Large Exudate Type: Serosanguineous Exudate Color: red, brown er Cleansing: No Wound Bed Granulation Amount: Large (67-100%) Exposed Structure Milbourne, Marybell B. ([id number]) Granulation Quality: Red, Hyper-granulation Fascia Exposed: No Necrotic Amount: None Present (0%) Fat Layer Exposed: No Tendon Exposed: No Muscle Exposed: No Joint Exposed: No Bone Exposed: No Limited to Skin Breakdown Periwound Skin Texture Texture Color No Abnormalities Noted: No No Abnormalities Noted: No Callus: No Atrophie Blanche: No Crepitus: No Cyanosis: No Excoriation: No Ecchymosis: No Fluctuance: No Erythema: No Friable: No Hemosiderin Staining: No Induration: No Mottled: No Localized Edema: No Pallor: No Rash: No Rubor: No Scarring: No Temperature / Pain Moisture Temperature: No Abnormality No Abnormalities Noted: No Tenderness on Palpation: Yes Dry / Scaly: No Maceration: No Moist: No Wound Preparation Ulcer Cleansing: Rinsed/Irrigated with Saline Topical Anesthetic Applied: None Treatment Notes Wound #1 (Sacrum) 1. Cleansed with: Clean wound with Normal Saline 3. Peri-wound Care: Skin Prep 4. Dressing Applied: Other dressing (specify in notes) 5. Secondary Dressing Applied Bordered Foam Dressing Notes RTD and Bordered Foam  Dressing Electronic Signature(s) Signed: 03/05/2016 8:02:19 AM By: Elliot Gurney, RN, [name], Kim RN, [name] Entered By: Elliot Gurney, RN, [name], Kim on 03/04/2016 15:55:00 Shukla, [name] ([id number]) Callari, [name] ([id number]) -------------------------------------------------------------------------------- Vitals Details Patient Name: Crader, Kalila B. Date of Service: 03/04/2016 3:45 PM Medical Record Patient Account Number: [id number] Number: Treating RN: Huel Coventry [birth year] (80 y.o. Other Clinician: Date of Birth/Sex: Female) Treating ROBSON, [name] Primary Care Physician: [name] Physician/Extender: G Referring Physician: Thedore Mins, [name] Weeks in Treatment: 20 Vital Signs Time Taken: 15:53 Pulse (bpm): 69 Height (in): 63 Respiratory Rate (breaths/min): 18 Blood Pressure (mmHg): 75/59 Reference Range: 80 - 120 mg / dl Electronic Signature(s) Signed: 03/05/2016 8:02:19 AM By: Elliot Gurney, RN, [name], Kim RN, [name] Entered By: Elliot Gurney, RN, [name], Kim on 03/04/2016 15:54:20

## 2016-03-05 NOTE — Progress Notes (Signed)
Perrier, MARGERT EDSALL ([id number]) Visit Report for 03/04/2016 Chief Complaint Document Details Patient Name: [name], [name] Date of Service: 03/04/2016 3:45 PM Medical Record Patient Account Number: [id number] Number: Treating RN: [name], RN, [name], [name] [birth year] [phone] y.o. Other Clinician: Date of Birth/Sex: Female) Treating ROBSON, [name] Primary Care Physician/Extender: [name], [name] Physician: Referring Physician: [name] Weeks in Treatment: 20 Information Obtained from: Patient Chief Complaint The patient is here for a sacral decubitus ulcer also a more superficial wound over her right hip Electronic Signature(s) Signed: 03/04/2016 4:24:05 PM By: Baltazar Najjar MD Entered By: Baltazar Najjar on 03/04/2016 16:04:18 [name], [name] ([id number]) -------------------------------------------------------------------------------- HPI Details Patient Name: [name], [name] Date of Service: 03/04/2016 3:45 PM Medical Record Patient Account Number: [id number] Number: Treating RN: [name], RN, [name], [name] 09/24/1935 [phone] y.o. Other Clinician: Date of Birth/Sex: Female) Treating ROBSON, [name] Primary Care Physician/Extender: [name], [name] Physician: Referring Physician: [name] Weeks in Treatment: 20 History of Present Illness HPI Description: 10/15/15; this is a very frail lady with advanced Alzheimer's disease who is being cared for aerobically at home by her daughter and granddaughter. She is essentially nonambulatory and seems to be minimally verbal. They tell me that she started to develop a pressure area in her sacral area in January. Initially a very small open area that progressed up. There is also a more recent area on the right hip. The patient was seen in her primary office for this problem on 2/23 at that point noted to have a stage III wound with tunneling over the sacrum. There were 2 other areas noted on the upper left and right  bilateral occipital. As well as a stage II on the right trochanter. She was referred to the ER because of tachycardia. She was hospitalized from 2/23 through 2/25. The admission this seems to open dominated by a distal fecal impaction. She was noted to have sinus tachycardia which improved. She did have a CT scan of the abdomen and pelvis during the hospitalization. In addition to the fecal impaction this noted the ulcer in the left lower gluteal region adjacent to the midline. There was air in the subcutaneous wound but no evidence of an air-fluid level to suggest an abscess. There was no comment about osteomyelitis. Since her return home she has not been eating well but is drinking. The family provides total care. I note that her albumin was 3 on admission to the hospital. Sodium got as high as 148 but was normalized by the time she left 10/24/15; this is a patient who has advanced dementia. She has a deep probing stage IV pressure ulcer over her lower sacral area. Cultures of this last week grew both Proteus and methicillin-resistant staph aureus. I and empirically put her on Cipro and doxycycline which should cover both of these. He also has a wound over her right greater trochanter. She is been followed by home health at home they're requesting lab work which I think is reasonable. I ordered a CMP, CBC with differential, sedimentation rate and a C-reactive protein 10/30/15 the patient's labs were done by home health but I think they faxed the orders to the primary physician's office. The plain x-ray I did did not show osteomyelitis however it did show fractures of the right superior and inferior pubic rami which are still not fully healed. I informed the patient's family of this. They do give a history of a fall while she was at an assisted living in [place] in November afterward  she had some trouble walking this may have been the issue. 11/13/15 the wound VAC only started 6 days ago. She is  apparently eating well 11/20/15; the patient continues with the wound VAC without any difficulties. This is being changed by home health. She is apparently eating well 12/04/15; the patient continues with a wound VAC without any difficulties. They're using collagen under the foam being changed by [facility]. The area over the right greater trochanter is also continued to improve she is eating well and undergoing therapeutic positioning by her granddaughter with regularity 12/18/15; the patient arrives with her daughter and granddaughter the latter of which is the primary caregiver. She continues with the wound VAC with collagen under the foam. She is apparently eating well and they are religiously offloading this area [name], [name] ([id number]) 01/01/16; this is a patient I follow every 2 weeks. She has severe dementia and developed a stage IV pressure ulcer over her lower sacrum and coccyx. In spite of the fact that her Alzheimer's disease is advanced, we have had excellent results using collagen under the foam of wound VAC. She has also had meticulous care for predominantly a granddaughter at home. 01/15/16; I follow this lady every 2 weeks for what was a central stage IV pressure wound over her lower sacrum and coccyx. In spite of advanced Alzheimer's disease in mobility and probably some degree of protein calorie malnutrition she is actually had an excellent result to a wound VAC with silver collagen under the VAC foam. She continues to do well the wound is a lot smaller. The tunneling that I was concerned about last week has resolved although there is still undermining superiorly from roughly 10 to 2:00 6/2 28/17; I follow this lady every 2 weeks for what was a stage IV pressure wound over her lower sacrum and coccyx. In spite of advanced Alzheimer's disease, relatively immobile state she has done exceptionally well with a wound VAC care. She still has undermining from 10 to 2:00  however the base of this is healthy and the orifice is really too small now to continue wound VAC. 02/12/16: Pressure ulcer continues to improve. she is accompanied by her daughter and granddaughter who take excellent care of her. 03/04/16; the surface area of this wound is now very small. Truly remarkable story. She has a slight amount of hyper granulation. She has been using collagen Electronic Signature(s) Signed: 03/04/2016 4:24:05 PM By: Baltazar Najjar MD Entered By: Baltazar Najjar on 03/04/2016 16:05:17 [name], [name] ([id number]) -------------------------------------------------------------------------------- Physical Exam Details Patient Name: [name], [name] Date of Service: 03/04/2016 3:45 PM Medical Record Patient Account Number: [id number] Number: Treating RN: [name], RN, [name], [name] April 24, 1936 [phone] y.o. Other Clinician: Date of Birth/Sex: Female) Treating ROBSON, [name] Primary Care Physician/Extender: [name], [name] Physician: Referring Physician: [name] Weeks in Treatment: 20 Constitutional Patient is hypotensive. Repeated at 111 systolic. Respirations regular, non-labored and within target range.. Temperature is normal and within the target range for the patient.. Family reports she is sleeping all the time. Neck Neck supple and symmetrical. No masses or crepitus. Thyroid within normal limits, without enlargement, tenderness or masses.Marland Kitchen Respiratory Respiratory effort is easy and symmetric bilaterally. Rate is normal at rest and on room air.. Bilateral breath sounds are clear and equal in all lobes with no wheezes, rales or rhonchi.. Cardiovascular Heart rhythm and rate regular, without murmur or gallop. She does not appear dehydrated. Gastrointestinal (GI) Abdomen is soft and non-distended without masses or tenderness. Bowel  sounds active in all quadrants.. No liver or spleen enlargement or tenderness.Marland Kitchen Psychiatric More lethargic today  background of severe dementia. Notes Wound exam; lower sacrum/coccyx. Only has small open area remains here. Slight amount of hyper granulation no evidence of infection Electronic Signature(s) Signed: 03/04/2016 4:24:05 PM By: Baltazar Najjar MD Entered By: Baltazar Najjar on 03/04/2016 16:12:16 Emel, [name] ([id number]) -------------------------------------------------------------------------------- Physician Orders Details Patient Name: [name], [name] Date of Service: 03/04/2016 3:45 PM Medical Record Patient Account Number: [id number] Number: Treating RN: Huel Coventry [birth year] (80 y.o. Other Clinician: Date of Birth/Sex: Female) Treating ROBSON, [name] Primary Care Physician/Extender: [name], [name] Physician: Referring Physician: [name] Weeks in Treatment: 20 Verbal / Phone Orders: Yes Clinician: Huel Coventry Read Back and Verified: Yes Diagnosis Coding Wound Cleansing Wound #1 Sacrum o Clean wound with Normal Saline. Skin Barriers/Peri-Wound Care Wound #1 Sacrum o Skin Prep Primary Wound Dressing Wound #1 Sacrum o RTD Secondary Dressing Wound #1 Sacrum o Boardered Foam Dressing Dressing Change Frequency Wound #1 Sacrum o Change dressing every other day. Follow-up Appointments Wound #1 Sacrum o Other: - return in 3 weeks Off-Loading Wound #1 Sacrum o Turn and reposition every 2 hours o Mattress - Hospital bed in place Additional Orders / Instructions Wound #1 Sacrum o Increase protein intake. Lick, [name] ([id number]) o Other: - Supplements: Vitamin C, MVI, Zinc Home Health Wound #1 Sacrum o Continue Home [facility] Visits - [facility] o Home Health Nurse may visit PRN to address patientos wound care needs. o FACE TO FACE ENCOUNTER: MEDICARE and MEDICAID PATIENTS: I certify that this patient is under my care and that I had a face-to-face encounter that meets the physician face-to-face encounter  requirements with this patient on this date. The encounter with the patient was in whole or in part for the following MEDICAL CONDITION: (primary reason for Home Healthcare) MEDICAL NECESSITY: I certify, that based on my findings, NURSING services are a medically necessary home health service. HOME BOUND STATUS: I certify that my clinical findings support that this patient is homebound (i.e., Due to illness or injury, pt requires aid of supportive devices such as crutches, cane, wheelchairs, walkers, the use of special transportation or the assistance of another person to leave their place of residence. There is a normal inability to leave the home and doing so requires considerable and taxing effort. Other absences are for medical reasons / religious services and are infrequent or of short duration when for other reasons). o If current dressing causes regression in wound condition, may D/C ordered dressing product/s and apply Normal Saline Moist Dressing daily until next Wound Healing Center / Other MD appointment. Notify Wound Healing Center of regression in wound condition at [phone]. o Please direct any NON-WOUND related issues/requests for orders to patient's Primary Care Physician Electronic Signature(s) Signed: 03/04/2016 4:24:05 PM By: Baltazar Najjar MD Signed: 03/05/2016 8:02:19 AM By: Elliot Gurney RN, [name], Kim RN, [name] Entered By: Elliot Gurney, RN, [name], Kim on 03/04/2016 16:02:00 [name], [name] ([id number]) -------------------------------------------------------------------------------- Problem List Details Patient Name: Reede, [place] B. Date of Service: 03/04/2016 3:45 PM Medical Record Patient Account Number: [id number] Number: Treating RN: [name], RN, [name], [name] 1936-02-02 [phone] y.o. Other Clinician: Date of Birth/Sex: Female) Treating ROBSON, [name] Primary Care Physician/Extender: [name], [name] Physician: Referring Physician: [name] Weeks in Treatment:  20 Active Problems ICD-10 Encounter Code Description Active Date Diagnosis L89.154 Pressure ulcer of sacral region, stage 4 10/15/2015 Yes E43 Unspecified severe protein-calorie malnutrition 10/15/2015 Yes Inactive Problems  Resolved Problems ICD-10 Code Description Active Date Resolved Date L03.317 Cellulitis of buttock 10/15/2015 10/15/2015 L89.213 Pressure ulcer of right hip, stage 3 10/15/2015 10/15/2015 Electronic Signature(s) Signed: 03/04/2016 4:24:05 PM By: Baltazar Najjar MD Entered By: Baltazar Najjar on 03/04/2016 16:03:59 [name], [name] ([id number]) -------------------------------------------------------------------------------- Progress Note Details Patient Name: [name], [name] Date of Service: 03/04/2016 3:45 PM Medical Record Patient Account Number: [id number] Number: Treating RN: [name], RN, [name], [name] 1936/02/03 [phone] y.o. Other Clinician: Date of Birth/Sex: Female) Treating ROBSON, [name] Primary Care Physician/Extender: [name], [name] Physician: Referring Physician: [name] Weeks in Treatment: 20 Subjective Chief Complaint Information obtained from Patient The patient is here for a sacral decubitus ulcer also a more superficial wound over her right hip History of Present Illness (HPI) 10/15/15; this is a very frail lady with advanced Alzheimer's disease who is being cared for aerobically at home by her daughter and granddaughter. She is essentially nonambulatory and seems to be minimally verbal. They tell me that she started to develop a pressure area in her sacral area in January. Initially a very small open area that progressed up. There is also a more recent area on the right hip. The patient was seen in her primary office for this problem on 2/23 at that point noted to have a stage III wound with tunneling over the sacrum. There were 2 other areas noted on the upper left and right bilateral occipital. As well as a stage II on the right  trochanter. She was referred to the ER because of tachycardia. She was hospitalized from 2/23 through 2/25. The admission this seems to open dominated by a distal fecal impaction. She was noted to have sinus tachycardia which improved. She did have a CT scan of the abdomen and pelvis during the hospitalization. In addition to the fecal impaction this noted the ulcer in the left lower gluteal region adjacent to the midline. There was air in the subcutaneous wound but no evidence of an air-fluid level to suggest an abscess. There was no comment about osteomyelitis. Since her return home she has not been eating well but is drinking. The family provides total care. I note that her albumin was 3 on admission to the hospital. Sodium got as high as 148 but was normalized by the time she left 10/24/15; this is a patient who has advanced dementia. She has a deep probing stage IV pressure ulcer over her lower sacral area. Cultures of this last week grew both Proteus and methicillin-resistant staph aureus. I and empirically put her on Cipro and doxycycline which should cover both of these. He also has a wound over her right greater trochanter. She is been followed by home health at home they're requesting lab work which I think is reasonable. I ordered a CMP, CBC with differential, sedimentation rate and a C-reactive protein 10/30/15 the patient's labs were done by home health but I think they faxed the orders to the primary physician's office. The plain x-ray I did did not show osteomyelitis however it did show fractures of the right superior and inferior pubic rami which are still not fully healed. I informed the patient's family of this. They do give a history of a fall while she was at an assisted living in [place] in November afterward she had some trouble walking this may have been the issue. 11/13/15 the wound VAC only started 6 days ago. She is apparently eating well 11/20/15; the patient continues with  the wound VAC without any difficulties. This  is being changed by home health. She is apparently eating well [name], [name] ([id number]) 12/04/15; the patient continues with a wound VAC without any difficulties. They're using collagen under the foam being changed by [facility]. The area over the right greater trochanter is also continued to improve she is eating well and undergoing therapeutic positioning by her granddaughter with regularity 12/18/15; the patient arrives with her daughter and granddaughter the latter of which is the primary caregiver. She continues with the wound VAC with collagen under the foam. She is apparently eating well and they are religiously offloading this area 01/01/16; this is a patient I follow every 2 weeks. She has severe dementia and developed a stage IV pressure ulcer over her lower sacrum and coccyx. In spite of the fact that her Alzheimer's disease is advanced, we have had excellent results using collagen under the foam of wound VAC. She has also had meticulous care for predominantly a granddaughter at home. 01/15/16; I follow this lady every 2 weeks for what was a central stage IV pressure wound over her lower sacrum and coccyx. In spite of advanced Alzheimer's disease in mobility and probably some degree of protein calorie malnutrition she is actually had an excellent result to a wound VAC with silver collagen under the VAC foam. She continues to do well the wound is a lot smaller. The tunneling that I was concerned about last week has resolved although there is still undermining superiorly from roughly 10 to 2:00 6/2 28/17; I follow this lady every 2 weeks for what was a stage IV pressure wound over her lower sacrum and coccyx. In spite of advanced Alzheimer's disease, relatively immobile state she has done exceptionally well with a wound VAC care. She still has undermining from 10 to 2:00 however the base of this is healthy and the orifice is really  too small now to continue wound VAC. 02/12/16: Pressure ulcer continues to improve. she is accompanied by her daughter and granddaughter who take excellent care of her. 03/04/16; the surface area of this wound is now very small. Truly remarkable story. She has a slight amount of hyper granulation. She has been using collagen Objective Constitutional Patient is hypotensive. Repeated at 111 systolic. Respirations regular, non-labored and within target range.. Temperature is normal and within the target range for the patient.. Family reports she is sleeping all the time. Vitals Time Taken: 3:53 PM, Height: 63 in, Pulse: 69 bpm, Respiratory Rate: 18 breaths/min, Blood Pressure: 75/59 mmHg. Neck Neck supple and symmetrical. No masses or crepitus. Thyroid within normal limits, without enlargement, tenderness or masses.Marland Kitchen Respiratory Respiratory effort is easy and symmetric bilaterally. Rate is normal at rest and on room air.. Bilateral breath sounds are clear and equal in all lobes with no wheezes, rales or rhonchi.. Cardiovascular Heart rhythm and rate regular, without murmur or gallop. She does not appear dehydrated. [name], [name] Stacks B. ([id number]) Gastrointestinal (GI) Abdomen is soft and non-distended without masses or tenderness. Bowel sounds active in all quadrants.. No liver or spleen enlargement or tenderness.Marland Kitchen Psychiatric More lethargic today background of severe dementia. General Notes: Wound exam; lower sacrum/coccyx. Only has small open area remains here. Slight amount of hyper granulation no evidence of infection Integumentary (Hair, Skin) Wound #1 status is Open. Original cause of wound was Gradually Appeared. The wound is located on the Sacrum. The wound measures 1.2cm length x 0.8cm width x 0.2cm depth; 0.754cm^2 area and 0.151cm^3 volume. The wound is limited to skin breakdown. There is a large  amount of serosanguineous drainage noted. The wound margin is thickened. There is  large (67-100%) red granulation within the wound bed. There is no necrotic tissue within the wound bed. The periwound skin appearance did not exhibit: Callus, Crepitus, Excoriation, Fluctuance, Friable, Induration, Localized Edema, Rash, Scarring, Dry/Scaly, Maceration, Moist, Atrophie Blanche, Cyanosis, Ecchymosis, Hemosiderin Staining, Mottled, Pallor, Rubor, Erythema. Periwound temperature was noted as No Abnormality. The periwound has tenderness on palpation. Assessment Active Problems ICD-10 L89.154 - Pressure ulcer of sacral region, stage 4 E43 - Unspecified severe protein-calorie malnutrition Plan Wound Cleansing: Wound #1 Sacrum: Clean wound with Normal Saline. Skin Barriers/Peri-Wound Care: Wound #1 Sacrum: Skin Prep Primary Wound Dressing: Wound #1 Sacrum: RTD Moquin, Claris B. ([id number]) Secondary Dressing: Wound #1 Sacrum: Boardered Foam Dressing Dressing Change Frequency: Wound #1 Sacrum: Change dressing every other day. Follow-up Appointments: Wound #1 Sacrum: Other: - return in 3 weeks Off-Loading: Wound #1 Sacrum: Turn and reposition every 2 hours Mattress - Hospital bed in place Additional Orders / Instructions: Wound #1 Sacrum: Increase protein intake. Other: - Supplements: Vitamin C, MVI, Zinc Home Health: Wound #1 Sacrum: Continue [facility] Visits - [facility] Home Health Nurse may visit PRN to address patient s wound care needs. FACE TO FACE ENCOUNTER: MEDICARE and MEDICAID PATIENTS: I certify that this patient is under my care and that I had a face-to-face encounter that meets the physician face-to-face encounter requirements with this patient on this date. The encounter with the patient was in whole or in part for the following MEDICAL CONDITION: (primary reason for Home Healthcare) MEDICAL NECESSITY: I certify, that based on my findings, NURSING services are a medically necessary home health service. HOME BOUND STATUS: I certify  that my clinical findings support that this patient is homebound (i.e., Due to illness or injury, pt requires aid of supportive devices such as crutches, cane, wheelchairs, walkers, the use of special transportation or the assistance of another person to leave their place of residence. There is a normal inability to leave the home and doing so requires considerable and taxing effort. Other absences are for medical reasons / religious services and are infrequent or of short duration when for other reasons). If current dressing causes regression in wound condition, may D/C ordered dressing product/s and apply Normal Saline Moist Dressing daily until next Wound Healing Center / Other MD appointment. Notify Wound Healing Center of regression in wound condition at [phone]. Please direct any NON-WOUND related issues/requests for orders to patient's Primary Care Physician I've change the dressing to RTD which might help with hyper granulation. I think her hypertensive medication should probably be held even with a systolic of 111 after we repeated her blood pressure there is no need for this type of meticulous blood pressure control in an elderly patient with advanced dementia. I have asked them to contact her primary M.D. We'll see the patient back in 3 weeks this may be healed by that time which is truly are unremarkable outcome Luster, JAIA ALONGE ([id number]) Electronic Signature(s) Signed: 03/04/2016 4:24:05 PM By: Baltazar Najjar MD Entered By: Baltazar Najjar on 03/04/2016 16:14:41 Fleener, [name] ([id number]) -------------------------------------------------------------------------------- SuperBill Details Patient Name: Bucknam, [name] Stacks B. Date of Service: 03/04/2016 Medical Record Patient Account Number: [id number] Number: Treating RN: [name], RN, [name], [name] [birth year] [phone] y.o. Other Clinician: Date of Birth/Sex: Female) Treating ROBSON, [name] Primary Care Physician/Extender:  [name], [name] Physician: Weeks in Treatment: 20 Referring Physician: [facility], [name] Diagnosis Coding ICD-10 Codes Code Description L89.154 Pressure ulcer of sacral  region, stage 4 E43 Unspecified severe protein-calorie malnutrition Facility Procedures CPT4 Code: [id number] Description: [phone] - WOUND CARE VISIT-LEV 2 EST PT Modifier: Quantity: 1 Physician Procedures CPT4 Code: [id number] Description: 99213 - WC PHYS LEVEL 3 - EST PT ICD-10 Description Diagnosis L89.154 Pressure ulcer of sacral region, stage 4 Modifier: Quantity: 1 Electronic Signature(s) Signed: 03/04/2016 4:24:05 PM By: Baltazar Najjar MD Entered By: Baltazar Najjar on 03/04/2016 16:15:11

## 2016-03-06 DIAGNOSIS — R000 Tachycardia, unspecified: Secondary | ICD-10-CM | POA: Diagnosis not present

## 2016-03-06 DIAGNOSIS — I10 Essential (primary) hypertension: Secondary | ICD-10-CM | POA: Diagnosis not present

## 2016-03-06 DIAGNOSIS — L89210 Pressure ulcer of right hip, unstageable: Secondary | ICD-10-CM | POA: Diagnosis not present

## 2016-03-06 DIAGNOSIS — G309 Alzheimer's disease, unspecified: Secondary | ICD-10-CM | POA: Diagnosis not present

## 2016-03-06 DIAGNOSIS — F0280 Dementia in other diseases classified elsewhere without behavioral disturbance: Secondary | ICD-10-CM | POA: Diagnosis not present

## 2016-03-06 DIAGNOSIS — L89150 Pressure ulcer of sacral region, unstageable: Secondary | ICD-10-CM | POA: Diagnosis not present

## 2016-03-06 DIAGNOSIS — L89620 Pressure ulcer of left heel, unstageable: Secondary | ICD-10-CM | POA: Diagnosis not present

## 2016-03-06 DIAGNOSIS — E785 Hyperlipidemia, unspecified: Secondary | ICD-10-CM | POA: Diagnosis not present

## 2016-03-09 DIAGNOSIS — L89150 Pressure ulcer of sacral region, unstageable: Secondary | ICD-10-CM | POA: Diagnosis not present

## 2016-03-09 DIAGNOSIS — R000 Tachycardia, unspecified: Secondary | ICD-10-CM | POA: Diagnosis not present

## 2016-03-09 DIAGNOSIS — L89210 Pressure ulcer of right hip, unstageable: Secondary | ICD-10-CM | POA: Diagnosis not present

## 2016-03-09 DIAGNOSIS — I10 Essential (primary) hypertension: Secondary | ICD-10-CM | POA: Diagnosis not present

## 2016-03-09 DIAGNOSIS — E785 Hyperlipidemia, unspecified: Secondary | ICD-10-CM | POA: Diagnosis not present

## 2016-03-09 DIAGNOSIS — F0280 Dementia in other diseases classified elsewhere without behavioral disturbance: Secondary | ICD-10-CM | POA: Diagnosis not present

## 2016-03-09 DIAGNOSIS — G309 Alzheimer's disease, unspecified: Secondary | ICD-10-CM | POA: Diagnosis not present

## 2016-03-09 DIAGNOSIS — L89620 Pressure ulcer of left heel, unstageable: Secondary | ICD-10-CM | POA: Diagnosis not present

## 2016-03-13 DIAGNOSIS — E785 Hyperlipidemia, unspecified: Secondary | ICD-10-CM | POA: Diagnosis not present

## 2016-03-13 DIAGNOSIS — L89210 Pressure ulcer of right hip, unstageable: Secondary | ICD-10-CM | POA: Diagnosis not present

## 2016-03-13 DIAGNOSIS — R000 Tachycardia, unspecified: Secondary | ICD-10-CM | POA: Diagnosis not present

## 2016-03-13 DIAGNOSIS — F0280 Dementia in other diseases classified elsewhere without behavioral disturbance: Secondary | ICD-10-CM | POA: Diagnosis not present

## 2016-03-13 DIAGNOSIS — L89620 Pressure ulcer of left heel, unstageable: Secondary | ICD-10-CM | POA: Diagnosis not present

## 2016-03-13 DIAGNOSIS — I10 Essential (primary) hypertension: Secondary | ICD-10-CM | POA: Diagnosis not present

## 2016-03-13 DIAGNOSIS — G309 Alzheimer's disease, unspecified: Secondary | ICD-10-CM | POA: Diagnosis not present

## 2016-03-13 DIAGNOSIS — L89150 Pressure ulcer of sacral region, unstageable: Secondary | ICD-10-CM | POA: Diagnosis not present

## 2016-03-16 DIAGNOSIS — L89150 Pressure ulcer of sacral region, unstageable: Secondary | ICD-10-CM | POA: Diagnosis not present

## 2016-03-16 DIAGNOSIS — R000 Tachycardia, unspecified: Secondary | ICD-10-CM | POA: Diagnosis not present

## 2016-03-16 DIAGNOSIS — E785 Hyperlipidemia, unspecified: Secondary | ICD-10-CM | POA: Diagnosis not present

## 2016-03-16 DIAGNOSIS — G309 Alzheimer's disease, unspecified: Secondary | ICD-10-CM | POA: Diagnosis not present

## 2016-03-16 DIAGNOSIS — L89620 Pressure ulcer of left heel, unstageable: Secondary | ICD-10-CM | POA: Diagnosis not present

## 2016-03-16 DIAGNOSIS — F0280 Dementia in other diseases classified elsewhere without behavioral disturbance: Secondary | ICD-10-CM | POA: Diagnosis not present

## 2016-03-16 DIAGNOSIS — I10 Essential (primary) hypertension: Secondary | ICD-10-CM | POA: Diagnosis not present

## 2016-03-16 DIAGNOSIS — L89210 Pressure ulcer of right hip, unstageable: Secondary | ICD-10-CM | POA: Diagnosis not present

## 2016-03-18 DIAGNOSIS — L89159 Pressure ulcer of sacral region, unspecified stage: Secondary | ICD-10-CM | POA: Diagnosis not present

## 2016-03-19 DIAGNOSIS — I10 Essential (primary) hypertension: Secondary | ICD-10-CM | POA: Diagnosis not present

## 2016-03-19 DIAGNOSIS — L89150 Pressure ulcer of sacral region, unstageable: Secondary | ICD-10-CM | POA: Diagnosis not present

## 2016-03-19 DIAGNOSIS — L89620 Pressure ulcer of left heel, unstageable: Secondary | ICD-10-CM | POA: Diagnosis not present

## 2016-03-19 DIAGNOSIS — R000 Tachycardia, unspecified: Secondary | ICD-10-CM | POA: Diagnosis not present

## 2016-03-19 DIAGNOSIS — E785 Hyperlipidemia, unspecified: Secondary | ICD-10-CM | POA: Diagnosis not present

## 2016-03-19 DIAGNOSIS — L89210 Pressure ulcer of right hip, unstageable: Secondary | ICD-10-CM | POA: Diagnosis not present

## 2016-03-19 DIAGNOSIS — G309 Alzheimer's disease, unspecified: Secondary | ICD-10-CM | POA: Diagnosis not present

## 2016-03-19 DIAGNOSIS — F0280 Dementia in other diseases classified elsewhere without behavioral disturbance: Secondary | ICD-10-CM | POA: Diagnosis not present

## 2016-03-22 DIAGNOSIS — L89154 Pressure ulcer of sacral region, stage 4: Secondary | ICD-10-CM | POA: Diagnosis not present

## 2016-03-22 DIAGNOSIS — L89150 Pressure ulcer of sacral region, unstageable: Secondary | ICD-10-CM | POA: Diagnosis not present

## 2016-03-23 DIAGNOSIS — I10 Essential (primary) hypertension: Secondary | ICD-10-CM | POA: Diagnosis not present

## 2016-03-23 DIAGNOSIS — L89150 Pressure ulcer of sacral region, unstageable: Secondary | ICD-10-CM | POA: Diagnosis not present

## 2016-03-23 DIAGNOSIS — F0280 Dementia in other diseases classified elsewhere without behavioral disturbance: Secondary | ICD-10-CM | POA: Diagnosis not present

## 2016-03-23 DIAGNOSIS — E785 Hyperlipidemia, unspecified: Secondary | ICD-10-CM | POA: Diagnosis not present

## 2016-03-23 DIAGNOSIS — L89620 Pressure ulcer of left heel, unstageable: Secondary | ICD-10-CM | POA: Diagnosis not present

## 2016-03-23 DIAGNOSIS — L89210 Pressure ulcer of right hip, unstageable: Secondary | ICD-10-CM | POA: Diagnosis not present

## 2016-03-23 DIAGNOSIS — G309 Alzheimer's disease, unspecified: Secondary | ICD-10-CM | POA: Diagnosis not present

## 2016-03-23 DIAGNOSIS — R000 Tachycardia, unspecified: Secondary | ICD-10-CM | POA: Diagnosis not present

## 2016-03-25 ENCOUNTER — Encounter: Payer: Medicare Other | Admitting: Internal Medicine

## 2016-03-25 DIAGNOSIS — Z880 Allergy status to penicillin: Secondary | ICD-10-CM | POA: Diagnosis not present

## 2016-03-25 DIAGNOSIS — D649 Anemia, unspecified: Secondary | ICD-10-CM | POA: Diagnosis not present

## 2016-03-25 DIAGNOSIS — E43 Unspecified severe protein-calorie malnutrition: Secondary | ICD-10-CM | POA: Diagnosis not present

## 2016-03-25 DIAGNOSIS — I10 Essential (primary) hypertension: Secondary | ICD-10-CM | POA: Diagnosis not present

## 2016-03-25 DIAGNOSIS — I252 Old myocardial infarction: Secondary | ICD-10-CM | POA: Diagnosis not present

## 2016-03-25 DIAGNOSIS — M1990 Unspecified osteoarthritis, unspecified site: Secondary | ICD-10-CM | POA: Diagnosis not present

## 2016-03-25 DIAGNOSIS — L89154 Pressure ulcer of sacral region, stage 4: Secondary | ICD-10-CM | POA: Diagnosis not present

## 2016-03-25 DIAGNOSIS — G309 Alzheimer's disease, unspecified: Secondary | ICD-10-CM | POA: Diagnosis not present

## 2016-03-25 DIAGNOSIS — Z87891 Personal history of nicotine dependence: Secondary | ICD-10-CM | POA: Diagnosis not present

## 2016-03-26 DIAGNOSIS — L89620 Pressure ulcer of left heel, unstageable: Secondary | ICD-10-CM | POA: Diagnosis not present

## 2016-03-26 DIAGNOSIS — I10 Essential (primary) hypertension: Secondary | ICD-10-CM | POA: Diagnosis not present

## 2016-03-26 DIAGNOSIS — F0280 Dementia in other diseases classified elsewhere without behavioral disturbance: Secondary | ICD-10-CM | POA: Diagnosis not present

## 2016-03-26 DIAGNOSIS — R000 Tachycardia, unspecified: Secondary | ICD-10-CM | POA: Diagnosis not present

## 2016-03-26 DIAGNOSIS — L89210 Pressure ulcer of right hip, unstageable: Secondary | ICD-10-CM | POA: Diagnosis not present

## 2016-03-26 DIAGNOSIS — G309 Alzheimer's disease, unspecified: Secondary | ICD-10-CM | POA: Diagnosis not present

## 2016-03-26 DIAGNOSIS — L89150 Pressure ulcer of sacral region, unstageable: Secondary | ICD-10-CM | POA: Diagnosis not present

## 2016-03-26 DIAGNOSIS — E785 Hyperlipidemia, unspecified: Secondary | ICD-10-CM | POA: Diagnosis not present

## 2016-03-27 NOTE — Progress Notes (Signed)
Schubring, KEONTA MONCEAUX ([id number]) Visit Report for 03/25/2016 Chief Complaint Document Details Patient Name: [name], [name] Date of Service: 03/25/2016 3:45 PM Medical Record Patient Account Number: [id number] Number: Treating RN: [name], RN, [name], [name] 1936-05-30 [phone] y.o. Other Clinician: Date of Birth/Sex: Female) Treating Abdimalik Mayorquin Primary Care Physician/Extender: [name], [name] Physician: Referring Physician: [name] Weeks in Treatment: 23 Information Obtained from: Patient Chief Complaint The patient is here for a sacral decubitus ulcer also a more superficial wound over her right hip Electronic Signature(s) Signed: 03/25/2016 5:34:52 PM By: Baltazar Najjar MD Entered By: Baltazar Najjar on 03/25/2016 17:07:58 [name], [name] ([id number]) -------------------------------------------------------------------------------- HPI Details Patient Name: [name], [name] Date of Service: 03/25/2016 3:45 PM Medical Record Patient Account Number: [id number] Number: Treating RN: [name], RN, [name], [name] 1936-04-19 [phone] y.o. Other Clinician: Date of Birth/Sex: Female) Treating Dulcy Sida Primary Care Physician/Extender: [name], [name] Physician: Referring Physician: [name] Weeks in Treatment: 23 History of Present Illness HPI Description: 10/15/15; this is a very frail lady with advanced Alzheimer's disease who is being cared for aerobically at home by her daughter and granddaughter. She is essentially nonambulatory and seems to be minimally verbal. They tell me that she started to develop a pressure area in her sacral area in January. Initially a very small open area that progressed up. There is also a more recent area on the right hip. The patient was seen in her primary office for this problem on 2/23 at that point noted to have a stage III wound with tunneling over the sacrum. There were 2 other areas noted on the upper left and right  bilateral occipital. As well as a stage II on the right trochanter. She was referred to the ER because of tachycardia. She was hospitalized from 2/23 through 2/25. The admission this seems to open dominated by a distal fecal impaction. She was noted to have sinus tachycardia which improved. She did have a CT scan of the abdomen and pelvis during the hospitalization. In addition to the fecal impaction this noted the ulcer in the left lower gluteal region adjacent to the midline. There was air in the subcutaneous wound but no evidence of an air-fluid level to suggest an abscess. There was no comment about osteomyelitis. Since her return home she has not been eating well but is drinking. The family provides total care. I note that her albumin was 3 on admission to the hospital. Sodium got as high as 148 but was normalized by the time she left 10/24/15; this is a patient who has advanced dementia. She has a deep probing stage IV pressure ulcer over her lower sacral area. Cultures of this last week grew both Proteus and methicillin-resistant staph aureus. I and empirically put her on Cipro and doxycycline which should cover both of these. He also has a wound over her right greater trochanter. She is been followed by home health at home they're requesting lab work which I think is reasonable. I ordered a CMP, CBC with differential, sedimentation rate and a C-reactive protein 10/30/15 the patient's labs were done by home health but I think they faxed the orders to the primary physician's office. The plain x-ray I did did not show osteomyelitis however it did show fractures of the right superior and inferior pubic rami which are still not fully healed. I informed the patient's family of this. They do give a history of a fall while she was at an assisted living in [place] in November afterward  she had some trouble walking this may have been the issue. 11/13/15 the wound VAC only started 6 days ago. She is  apparently eating well 11/20/15; the patient continues with the wound VAC without any difficulties. This is being changed by home health. She is apparently eating well 12/04/15; the patient continues with a wound VAC without any difficulties. They're using collagen under the foam being changed by advanced home care. The area over the right greater trochanter is also continued to improve she is eating well and undergoing therapeutic positioning by her granddaughter with regularity 12/18/15; the patient arrives with her daughter and granddaughter the latter of which is the primary caregiver. She continues with the wound VAC with collagen under the foam. She is apparently eating well and they are religiously offloading this area [name], [name] ([id number]) 01/01/16; this is a patient I follow every 2 weeks. She has severe dementia and developed a stage IV pressure ulcer over her lower sacrum and coccyx. In spite of the fact that her Alzheimer's disease is advanced, we have had excellent results using collagen under the foam of wound VAC. She has also had meticulous care for predominantly a granddaughter at home. 01/15/16; I follow this lady every 2 weeks for what was a central stage IV pressure wound over her lower sacrum and coccyx. In spite of advanced Alzheimer's disease in mobility and probably some degree of protein calorie malnutrition she is actually had an excellent result to a wound VAC with silver collagen under the VAC foam. She continues to do well the wound is a lot smaller. The tunneling that I was concerned about last week has resolved although there is still undermining superiorly from roughly 10 to 2:00 6/2 28/17; I follow this lady every 2 weeks for what was a stage IV pressure wound over her lower sacrum and coccyx. In spite of advanced Alzheimer's disease, relatively immobile state she has done exceptionally well with a wound VAC care. She still has undermining from 10 to 2:00  however the base of this is healthy and the orifice is really too small now to continue wound VAC. 02/12/16: Pressure ulcer continues to improve. she is accompanied by her daughter and granddaughter who take excellent care of her. 03/04/16; the surface area of this wound is now very small. Truly remarkable story. She has a slight amount of hyper granulation. She has been using collagen. 03/25/16 this is a woman with advanced Alzheimer's disease. She had a remarkable stage IV wound over her lower sacrum coccyx and surrounding pelvis. She did remarkably well with good care at home by her family and a wound VAC. This wound currently is actually closed and I was prepared to declare this healed and discharge the patient from the clinic. HOWEVER they showed me a rash that it been present for the last week. This is on the lateral aspect of her left buttock. Perhaps 2 inches in width going from the lower end of her left buttock up into her lower back. There is no history of blistering. There is probably some tenderness. Electronic Signature(s) Signed: 03/25/2016 5:34:52 PM By: Baltazar Najjar MD Entered By: Baltazar Najjar on 03/25/2016 17:10:29 [place], [name] ([id number]) -------------------------------------------------------------------------------- Physical Exam Details Patient Name: Bartnick, Canesha B. Date of Service: 03/25/2016 3:45 PM Medical Record Patient Account Number: [id number] Number: Treating RN: [name], RN, [name], [name] [birth year] [phone] y.o. Other Clinician: Date of Birth/Sex: Female) Treating Courtne Lighty Primary Care Physician/Extender: [name], [name] Physician: Referring Physician: Sharene Butters  in Treatment: 23 Constitutional Sitting or standing Blood Pressure is within target range for patient.. Pulse regular and within target range for patient.Marland Kitchen Respirations regular, non-labored and within target range.. Temperature is normal and within the target range  for the patient.. Patient remains frail and minimally responsive secondary to the advanced state of her dementia. Respiratory Respiratory effort is easy and symmetric bilaterally. Rate is normal at rest and on room air.. Integumentary (Hair, Skin) On her lateral left buttock is a 2-3 inch confluent vertical rash going from her lower buttock into her lower back. At the top of this there is erythema. It is difficult to determine whether there is tenderness here or not because of the advanced state of her dementia.Marland Kitchen Psychiatric Advanced dementia. Notes Wound exam; the area over her lower sacrum and coccyx is totally closed. Electronic Signature(s) Signed: 03/25/2016 5:34:52 PM By: Baltazar Najjar MD Entered By: Baltazar Najjar on 03/25/2016 17:12:46 [name], [name] ([id number]) -------------------------------------------------------------------------------- Physician Orders Details Patient Name: [name], [name] Date of Service: 03/25/2016 3:45 PM Medical Record Patient Account Number: [id number] Number: Treating RN: Huel Coventry Oct 14, 1935 (80 y.o. Other Clinician: Date of Birth/Sex: Female) Treating Mael Delap Primary Care Physician/Extender: [name], [name] Physician: Referring Physician: [name] Weeks in Treatment: 70 Verbal / Phone Orders: Yes Clinician: Huel Coventry Read Back and Verified: Yes Diagnosis Coding Wound Cleansing o Clean wound with Normal Saline. Skin Barriers/Peri-Wound Care o Barrier cream o Other: - triamcinolone cream Dressing Change Frequency o Change dressing every day. Follow-up Appointments o Return Appointment in 1 week. Electronic Signature(s) Signed: 03/25/2016 5:34:52 PM By: Baltazar Najjar MD Signed: 03/26/2016 4:59:47 PM By: Elliot Gurney RN, [name], Kim RN, [name] Entered By: Elliot Gurney, RN, [name], Kim on 03/25/2016 16:36:46 [place], [name]  ([id number]) -------------------------------------------------------------------------------- Problem List Details Patient Name: [name], [name] Date of Service: 03/25/2016 3:45 PM Medical Record Patient Account Number: [id number] Number: Treating RN: [name], RN, [name], [name] Dec 27, 1935 [phone] y.o. Other Clinician: Date of Birth/Sex: Female) Treating Knight Oelkers Primary Care Physician/Extender: [name], [name] Physician: Referring Physician: [name] Weeks in Treatment: 23 Active Problems ICD-10 Encounter Code Description Active Date Diagnosis E43 Unspecified severe protein-calorie malnutrition 10/15/2015 Yes B02.8 Zoster with other complications 03/25/2016 Yes Inactive Problems ICD-10 Code Description Active Date Inactive Date L89.154 Pressure ulcer of sacral region, stage 4 10/15/2015 10/15/2015 Resolved Problems ICD-10 Code Description Active Date Resolved Date L03.317 Cellulitis of buttock 10/15/2015 10/15/2015 L89.213 Pressure ulcer of right hip, stage 3 10/15/2015 10/15/2015 Electronic Signature(s) Signed: 03/25/2016 5:34:52 PM By: Baltazar Najjar MD Entered By: Baltazar Najjar on 03/25/2016 17:07:34 [place], [name] ([id number]) -------------------------------------------------------------------------------- Progress Note Details Patient Name: [name], [name] Date of Service: 03/25/2016 3:45 PM Medical Record Patient Account Number: [id number] Number: Treating RN: [name], RN, [name], [name] Aug 11, 1935 [phone] y.o. Other Clinician: Date of Birth/Sex: Female) Treating Amandine Covino Primary Care Physician/Extender: [name], [name] Physician: Referring Physician: [name] Weeks in Treatment: 23 Subjective Chief Complaint Information obtained from Patient The patient is here for a sacral decubitus ulcer also a more superficial wound over her right hip History of Present Illness (HPI) 10/15/15; this is a very frail lady with advanced Alzheimer's  disease who is being cared for aerobically at home by her daughter and granddaughter. She is essentially nonambulatory and seems to be minimally verbal. They tell me that she started to develop a pressure area in her sacral area in January. Initially a very small open area that progressed up. There is also a more recent area  on the right hip. The patient was seen in her primary office for this problem on 2/23 at that point noted to have a stage III wound with tunneling over the sacrum. There were 2 other areas noted on the upper left and right bilateral occipital. As well as a stage II on the right trochanter. She was referred to the ER because of tachycardia. She was hospitalized from 2/23 through 2/25. The admission this seems to open dominated by a distal fecal impaction. She was noted to have sinus tachycardia which improved. She did have a CT scan of the abdomen and pelvis during the hospitalization. In addition to the fecal impaction this noted the ulcer in the left lower gluteal region adjacent to the midline. There was air in the subcutaneous wound but no evidence of an air-fluid level to suggest an abscess. There was no comment about osteomyelitis. Since her return home she has not been eating well but is drinking. The family provides total care. I note that her albumin was 3 on admission to the hospital. Sodium got as high as 148 but was normalized by the time she left 10/24/15; this is a patient who has advanced dementia. She has a deep probing stage IV pressure ulcer over her lower sacral area. Cultures of this last week grew both Proteus and methicillin-resistant staph aureus. I and empirically put her on Cipro and doxycycline which should cover both of these. He also has a wound over her right greater trochanter. She is been followed by home health at home they're requesting lab work which I think is reasonable. I ordered a CMP, CBC with differential, sedimentation rate and a  C-reactive protein 10/30/15 the patient's labs were done by home health but I think they faxed the orders to the primary physician's office. The plain x-ray I did did not show osteomyelitis however it did show fractures of the right superior and inferior pubic rami which are still not fully healed. I informed the patient's family of this. They do give a history of a fall while she was at an assisted living in [place] in November afterward she had some trouble walking this may have been the issue. 11/13/15 the wound VAC only started 6 days ago. She is apparently eating well 11/20/15; the patient continues with the wound VAC without any difficulties. This is being changed by home health. She is apparently eating well [name], [name] ([id number]) 12/04/15; the patient continues with a wound VAC without any difficulties. They're using collagen under the foam being changed by advanced home [facility]. The area over the right greater trochanter is also continued to improve she is eating well and undergoing therapeutic positioning by her granddaughter with regularity 12/18/15; the patient arrives with her daughter and granddaughter the latter of which is the primary caregiver. She continues with the wound VAC with collagen under the foam. She is apparently eating well and they are religiously offloading this area 01/01/16; this is a patient I follow every 2 weeks. She has severe dementia and developed a stage IV pressure ulcer over her lower sacrum and coccyx. In spite of the fact that her Alzheimer's disease is advanced, we have had excellent results using collagen under the foam of wound VAC. She has also had meticulous care for predominantly a granddaughter at home. 01/15/16; I follow this lady every 2 weeks for what was a central stage IV pressure wound over her lower sacrum and coccyx. In spite of advanced Alzheimer's disease in mobility and probably some  degree of protein calorie malnutrition she is  actually had an excellent result to a wound VAC with silver collagen under the VAC foam. She continues to do well the wound is a lot smaller. The tunneling that I was concerned about last week has resolved although there is still undermining superiorly from roughly 10 to 2:00 6/2 28/17; I follow this lady every 2 weeks for what was a stage IV pressure wound over her lower sacrum and coccyx. In spite of advanced Alzheimer's disease, relatively immobile state she has done exceptionally well with a wound VAC care. She still has undermining from 10 to 2:00 however the base of this is healthy and the orifice is really too small now to continue wound VAC. 02/12/16: Pressure ulcer continues to improve. she is accompanied by her daughter and granddaughter who take excellent care of her. 03/04/16; the surface area of this wound is now very small. Truly remarkable story. She has a slight amount of hyper granulation. She has been using collagen. 03/25/16 this is a woman with advanced Alzheimer's disease. She had a remarkable stage IV wound over her lower sacrum coccyx and surrounding pelvis. She did remarkably well with good care at home by her family and a wound VAC. This wound currently is actually closed and I was prepared to declare this healed and discharge the patient from the clinic. HOWEVER they showed me a rash that it been present for the last week. This is on the lateral aspect of her left buttock. Perhaps 2 inches in width going from the lower end of her left buttock up into her lower back. There is no history of blistering. There is probably some tenderness. Objective Constitutional Sitting or standing Blood Pressure is within target range for patient.. Pulse regular and within target range for patient.Marland Kitchen Respirations regular, non-labored and within target range.. Temperature is normal and within the target range for the patient.. Patient remains frail and minimally responsive secondary to the  advanced state of her dementia. Vitals Time Taken: 4:10 PM, Height: 63 in, Temperature: 97.7 F, Pulse: 82 bpm, Respiratory Rate: 18 breaths/min, Blood Pressure: 111/56 mmHg. Respiratory Respiratory effort is easy and symmetric bilaterally. Rate is normal at rest and on room air.Marland Kitchen [name], [name] ([id number]) Psychiatric Advanced dementia. General Notes: Wound exam; the area over her lower sacrum and coccyx is totally closed. Integumentary (Hair, Skin) On her lateral left buttock is a 2-3 inch confluent vertical rash going from her lower buttock into her lower back. At the top of this there is erythema. It is difficult to determine whether there is tenderness here or not because of the advanced state of her dementia.. Wound #1 status is Open. Original cause of wound was Gradually Appeared. The wound is located on the Sacrum. The wound measures 0cm length x 0cm width x 0cm depth; 0cm^2 area and 0cm^3 volume. Assessment Active Problems ICD-10 E43 - Unspecified severe protein-calorie malnutrition B02.8 - Zoster with other complications Plan Wound Cleansing: Clean wound with Normal Saline. Skin Barriers/Peri-Wound Care: Barrier cream Other: - triamcinolone cream Dressing Change Frequency: Change dressing every day. Follow-up Appointments: Return Appointment in 1 week. [name], [name] ([id number]) #1 her stage IV sacral/coccyx wound is totally closed over and I declared this healed. This is truly remarkable in itself. #2 the area on her left lateral buttock curving towards the center when it reaches her lower back looks dermatomal to me. Although there is not a description of classic blistering lesions I think this is likely herpes  zoster. This is been about a week or more and is not going to be amenable to antiviral therapy. I am further somewhat concerned about the possibility of coexistent secondary cellulitis at the superior aspect of this I therefore given her a course of  doxycycline for 7 days. 2 the erythema and reminiscence of the rash triamcinolone and barrier cream. Although I was prepared to discharge the patient today, the rash will necessitate another visit. #4 the patient's family raised the possibility of hospice today. I have gone over this with them and they indeed seem ready for end-of-life care at home. The question was prompted by the fact that they would like to keep her hospital bed and her pressure-relief mattress however they are I think ready for end-of-life care for somebody with end-stage Alzheimer's disease. I told him I would support them in any way I've could Electronic Signature(s) Signed: 03/25/2016 5:34:52 PM By: Baltazar Najjarobson, Esli Jernigan MD Entered By: Baltazar Najjarobson, Karandeep Resende on 03/25/2016 17:16:25 Vazques, [name] ([id number]) -------------------------------------------------------------------------------- SuperBill Details Patient Name: [name], [name] Date of Service: 03/25/2016 Medical Record Patient Account Number: [id number] Number: Treating RN: Huel CoventryWoody, Kim [birth year] ([birth year](80 y.o. Other Clinician: Date of Birth/Sex: Female) Treating Raguel Kosloski Primary Care Physician/Extender: Marlana LatusG SINGH, [name] Physician: Weeks in Treatment: 23 Referring Physician: [facility] [name] Diagnosis Coding ICD-10 Codes Code Description L89.154 Pressure ulcer of sacral region, stage 4 E43 Unspecified severe protein-calorie malnutrition Facility Procedures CPT4 Code: [id number] Description: [id number] - WOUND CARE VISIT-LEV 2 EST PT Modifier: Quantity: 1 Physician Procedures CPT4 Code: [id number] Description: 99213 - WC PHYS LEVEL 3 - EST PT ICD-10 Description Diagnosis L89.154 Pressure ulcer of sacral region, stage 4 Modifier: Quantity: 1 Electronic Signature(s) Signed: 03/25/2016 5:34:52 PM By: Baltazar Najjarobson, Domonic Hiscox MD Entered By: Baltazar Najjarobson, Trenna Kiely on 03/25/2016 17:16:55

## 2016-03-27 NOTE — Progress Notes (Signed)
[name] ([id number]) Visit Report for 03/25/2016 Arrival Information Details Patient Name: [name], [name] Date of Service: 03/25/2016 3:45 PM Medical Record Patient Account Number: [id number] Number: Treating RN: Huel Coventry 12-Jan-1936 (80 y.o. Other Clinician: Date of Birth/Sex: Female) Treating ROBSON, [name] Primary Care Physician: [name] Physician/Extender: G Referring Physician: [name] Weeks in Treatment: 23 Visit Information History Since Last Visit Added or deleted any medications: Yes Patient Arrived: Wheel Chair Any new allergies or adverse reactions: No Arrival Time: 16:09 Had a fall or experienced change in No Accompanied By: daughter and activities of daily living that may affect granddaughter risk of falls: Transfer Assistance: Manual Signs or symptoms of abuse/neglect since last No Patient Identification Verified: Yes visito Secondary Verification Yes Hospitalized since last visit: No Process Completed: Pain Present Now: No Patient Requires No Transmission-Based Precautions: Patient Has Alerts: No Electronic Signature(s) Signed: 03/26/2016 4:59:47 PM By: Elliot Gurney, RN, [name], Kim RN, [name] Entered By: Elliot Gurney, RN, [name], Kim on 03/25/2016 16:10:02 [name] ([id number]) -------------------------------------------------------------------------------- Clinic Level of Care Assessment Details Patient Name: [name], [name] Date of Service: 03/25/2016 3:45 PM Medical Record Patient Account Number: [id number] Number: Treating RN: Huel Coventry April 01, 1936 (80 y.o. Other Clinician: Date of Birth/Sex: Female) Treating ROBSON, [name] Primary Care Physician: [name] Physician/Extender: G Referring Physician: Thedore Mins, [name] Weeks in Treatment: 23 Clinic Level of Care Assessment Items TOOL 4 Quantity Score []  - Use when only an EandM is performed on FOLLOW-UP visit 0 ASSESSMENTS - Nursing Assessment /  Reassessment []  - Reassessment of Co-morbidities (includes updates in patient status) 0 []  - Reassessment of Adherence to Treatment Plan 0 ASSESSMENTS - Wound and Skin Assessment / Reassessment X - Simple Wound Assessment / Reassessment - one wound 1 5 []  - Complex Wound Assessment / Reassessment - multiple wounds 0 []  - Dermatologic / Skin Assessment (not related to wound area) 0 ASSESSMENTS - Focused Assessment []  - Circumferential Edema Measurements - multi extremities 0 []  - Nutritional Assessment / Counseling / Intervention 0 []  - Lower Extremity Assessment (monofilament, tuning fork, pulses) 0 []  - Peripheral Arterial Disease Assessment (using hand held doppler) 0 ASSESSMENTS - Ostomy and/or Continence Assessment and Care []  - Incontinence Assessment and Management 0 []  - Ostomy Care Assessment and Management (repouching, etc.) 0 PROCESS - Coordination of Care X - Simple Patient / Family Education for ongoing care 1 15 []  - Complex (extensive) Patient / Family Education for ongoing care 0 []  - Staff obtains Chiropractor, Records, Test Results / Process Orders 0 []  - Staff telephones HHA, Nursing Homes / Clarify orders / etc 0 [name] ([id number]) []  - Routine Transfer to another Facility (non-emergent condition) 0 []  - Routine Hospital Admission (non-emergent condition) 0 []  - New Admissions / Manufacturing engineer / Ordering NPWT, Apligraf, etc. 0 []  - Emergency Hospital Admission (emergent condition) 0 X - Simple Discharge Coordination 1 10 []  - Complex (extensive) Discharge Coordination 0 PROCESS - Special Needs []  - Pediatric / Minor Patient Management 0 []  - Isolation Patient Management 0 []  - Hearing / Language / Visual special needs 0 []  - Assessment of Community assistance (transportation, D/C planning, etc.) 0 []  - Additional assistance / Altered mentation 0 []  - Support Surface(s) Assessment (bed, cushion, seat, etc.) 0 INTERVENTIONS - Wound Cleansing /  Measurement []  - Simple Wound Cleansing - one wound 0 []  - Complex Wound Cleansing - multiple wounds 0 X - Wound Imaging (photographs - any number of wounds) 1 5 []  -  Wound Tracing (instead of photographs) 0 []  - Simple Wound Measurement - one wound 0 []  - Complex Wound Measurement - multiple wounds 0 INTERVENTIONS - Wound Dressings []  - Small Wound Dressing one or multiple wounds 0 []  - Medium Wound Dressing one or multiple wounds 0 []  - Large Wound Dressing one or multiple wounds 0 X - Application of Medications - topical 1 5 []  - Application of Medications - injection 0 [name], [name] ([id number]) INTERVENTIONS - Miscellaneous []  - External ear exam 0 []  - Specimen Collection (cultures, biopsies, blood, body fluids, etc.) 0 []  - Specimen(s) / Culture(s) sent or taken to Lab for analysis 0 []  - Patient Transfer (multiple staff / Michiel SitesHoyer Lift / Similar devices) 0 []  - Simple Staple / Suture removal (25 or less) 0 []  - Complex Staple / Suture removal (26 or more) 0 []  - Hypo / Hyperglycemic Management (close monitor of Blood Glucose) 0 []  - Ankle / Brachial Index (ABI) - do not check if billed separately 0 X - Vital Signs 1 5 Has the patient been seen at the hospital within the last three years: Yes Total Score: 45 Level Of Care: New/Established - Level 2 Electronic Signature(s) Signed: 03/26/2016 4:59:47 PM By: Elliot GurneyWoody, RN, [name], Kim RN, [name] Entered By: Elliot GurneyWoody, RN, [name], Kim on 03/25/2016 17:05:24 [name], [name] ([id number]) -------------------------------------------------------------------------------- Encounter Discharge Information Details Patient Name: Mayden, [name] Date of Service: 03/25/2016 3:45 PM Medical Record Patient Account Number: [id number] Number: Treating RN: Clover MealyAfful, RN, [name], [name] Sep 30, 1935 [phone](80 y.o. Other Clinician: Date of Birth/Sex: Female) Treating ROBSON, [name] Primary Care Physician: Leotis ShamesSINGH, [name] Physician/Extender: G Referring  Physician: Leotis ShamesSINGH, [name] Weeks in Treatment: 8723 Encounter Discharge Information Items Discharge Pain Level: 0 Discharge Condition: Stable Ambulatory Status: Wheelchair Discharge Destination: Home Transportation: Private Auto daughter and Accompanied By: granddaughter Schedule Follow-up Appointment: Yes Medication Reconciliation completed and provided to Yes Patient/Care Elizebath Wever: Provided on Clinical Summary of Care: 03/25/2016 Form Type Recipient Paper Patient Ann MakiWJ Electronic Signature(s) Signed: 03/26/2016 4:59:47 PM By: Elliot GurneyWoody, RN, [name], Kim RN, [name] Previous Signature: 03/25/2016 4:49:27 PM Version By: Gwenlyn PerkingMoore, Shelia Entered By: Elliot GurneyWoody, RN, [name], Kim on 03/25/2016 17:06:32 [name], [name] ([id number]) -------------------------------------------------------------------------------- Multi Wound Chart Details Patient Name: [name], [name] Date of Service: 03/25/2016 3:45 PM Medical Record Patient Account Number: [id number] Number: Treating RN: Huel CoventryWoody, Kim Sep 30, 1935 ([age] y.o. Other Clinician: Date of Birth/Sex: Female) Treating ROBSON, [name] Primary Care Physician: Leotis ShamesSINGH, [name] Physician/Extender: G Referring Physician: Thedore MinsSINGH, [name] Weeks in Treatment: 23 Vital Signs Height(in): 63 Pulse(bpm): 82 Weight(lbs): Blood Pressure 111/56 (mmHg): Body Mass Index(BMI): Temperature(F): 97.7 Respiratory Rate 18 (breaths/min): Photos: [1:No Photos] [N/A:N/A] Wound Location: [1:Sacrum] [N/A:N/A] Wounding Event: [1:Gradually Appeared] [N/A:N/A] Primary Etiology: [1:Pressure Ulcer] [N/A:N/A] Date Acquired: [1:09/10/2015] [N/A:N/A] Weeks of Treatment: [1:23] [N/A:N/A] Wound Status: [1:Open] [N/A:N/A] Measurements L x W x D 0x0x0 [N/A:N/A] (cm) Area (cm) : [1:0] [N/A:N/A] Volume (cm) : [1:0] [N/A:N/A] % Reduction in Area: [1:100.00%] [N/A:N/A] % Reduction in Volume: 100.00% [N/A:N/A] Classification: [1:Category/Stage IV] [N/A:N/A] Periwound Skin Texture: No  Abnormalities Noted [N/A:N/A] Periwound Skin [1:No Abnormalities Noted] [N/A:N/A] Moisture: Periwound Skin Color: No Abnormalities Noted [N/A:N/A] Tenderness on [1:No] [N/A:N/A] Treatment Notes Electronic Signature(s) Signed: 03/26/2016 4:59:47 PM By: Elliot GurneyWoody, RN, [name], Kim RN, [name] Entered By: Elliot GurneyWoody, RN, [name], Kim on 03/25/2016 16:25:01 Fetty, [name] ([id number]) Knapper, [name] ([id number]) -------------------------------------------------------------------------------- Multi-Disciplinary Care Plan Details Patient Name: [name], [name] Date of Service: 03/25/2016 3:45 PM Medical Record Patient Account Number: [id number] Number: Treating RN: Elliot GurneyWoody,  Kim [birth year] (80 y.o. Other Clinician: Date of Birth/Sex: Female) Treating ROBSON, [name] Primary Care Physician: [name] Physician/Extender: G Referring Physician: Thedore Mins, [name] Weeks in Treatment: 42 Active Inactive Abuse / Safety / Falls / Self Care Management Nursing Diagnoses: Impaired home maintenance Impaired physical mobility Knowledge deficit related to: safety; personal, health (wound), emergency Potential for falls Self care deficit: actual or potential Goals: Patient will remain injury free Date Initiated: 10/15/2015 Goal Status: Active Patient/caregiver will verbalize understanding of skin care regimen Date Initiated: 10/15/2015 Goal Status: Active Patient/caregiver will verbalize/demonstrate measure taken to improve self care Date Initiated: 10/15/2015 Goal Status: Active Patient/caregiver will verbalize/demonstrate measures taken to improve the patient's personal safety Date Initiated: 10/15/2015 Goal Status: Active Patient/caregiver will verbalize/demonstrate measures taken to prevent injury and/or falls Date Initiated: 10/15/2015 Goal Status: Active Patient/caregiver will verbalize/demonstrate understanding of what to do in case of emergency Date Initiated: 10/15/2015 Goal Status:  Active Interventions: Assess fall risk on admission and as needed Assess: immobility, friction, shearing, incontinence upon admission and as needed Assess impairment of mobility on admission and as needed per policy Larcom, Marchia B. ([id number]) Assess self care needs on admission and as needed Provide education on basic hygiene Provide education on fall prevention Provide education on personal and home safety Provide education on safe transfers Treatment Activities: Education provided on Basic Hygiene : 01/15/2016 Patient referred to home care : 11/20/2015 Notes: Nutrition Nursing Diagnoses: Imbalanced nutrition Impaired glucose control: actual or potential Potential for alteratiion in Nutrition/Potential for imbalanced nutrition Goals: Patient/caregiver agrees to and verbalizes understanding of need to obtain nutritional consultation Date Initiated: 10/15/2015 Goal Status: Active Patient/caregiver agrees to and verbalizes understanding of need to use nutritional supplements and/or vitamins as prescribed Date Initiated: 10/15/2015 Goal Status: Active Patient/caregiver verbalizes understanding of need to maintain therapeutic glucose control per primary care physician Date Initiated: 10/15/2015 Goal Status: Active Interventions: Assess patient nutrition upon admission and as needed per policy Provide education on elevated blood sugars and impact on wound healing Provide education on nutrition Treatment Activities: Education provided on Nutrition : 01/15/2016 Notes: Orientation to the Wound Care Program Nursing Diagnoses: Knowledge deficit related to the wound healing center program [place], [facility] B. ([id number]) Goals: Patient/caregiver will verbalize understanding of the Wound Healing Center Program Date Initiated: 10/15/2015 Goal Status: Active Interventions: Provide education on orientation to the wound center Notes: Pressure Nursing Diagnoses: Knowledge deficit  related to causes and risk factors for pressure ulcer development Knowledge deficit related to management of pressures ulcers Potential for impaired tissue integrity related to pressure, friction, moisture, and shear Goals: Patient will remain free from development of additional pressure ulcers Date Initiated: 10/15/2015 Goal Status: Active Patient will remain free of pressure ulcers Date Initiated: 10/15/2015 Goal Status: Active Patient/caregiver will verbalize risk factors for pressure ulcer development Date Initiated: 10/15/2015 Goal Status: Active Patient/caregiver will verbalize understanding of pressure ulcer management Date Initiated: 10/15/2015 Goal Status: Active Interventions: Assess: immobility, friction, shearing, incontinence upon admission and as needed Assess offloading mechanisms upon admission and as needed Assess potential for pressure ulcer upon admission and as needed Provide education on pressure ulcers Treatment Activities: Patient referred for home evaluation of offloading devices/mattresses : 11/20/2015 Patient referred for pressure reduction/relief devices : 11/20/2015 Patient referred for seating evaluation to ensure proper offloading : 11/20/2015 Pressure reduction/relief device ordered : 11/20/2015 Notes: [name], [name] ([id number]) Wound/Skin Impairment Nursing Diagnoses: Impaired tissue integrity Knowledge deficit related to smoking impact on wound healing Knowledge deficit related to ulceration/compromised skin integrity Goals: Patient/caregiver will  verbalize understanding of skin care regimen Date Initiated: 10/15/2015 Goal Status: Active Ulcer/skin breakdown will have a volume reduction of 30% by week 4 Date Initiated: 10/15/2015 Goal Status: Active Ulcer/skin breakdown will have a volume reduction of 50% by week 8 Date Initiated: 10/15/2015 Goal Status: Active Ulcer/skin breakdown will have a volume reduction of 80% by week 12 Date Initiated:  10/15/2015 Goal Status: Active Ulcer/skin breakdown will heal within 14 weeks Date Initiated: 10/15/2015 Goal Status: Active Interventions: Assess patient/caregiver ability to obtain necessary supplies Assess patient/caregiver ability to perform ulcer/skin care regimen upon admission and as needed Assess ulceration(s) every visit Provide education on ulcer and skin care Treatment Activities: Patient referred to home care : 11/20/2015 Skin care regimen initiated : 11/20/2015 Notes: Electronic Signature(s) Signed: 03/26/2016 4:59:47 PM By: Elliot Gurney, RN, [name], Kim RN, [name] Entered By: Elliot Gurney, RN, [name], Kim on 03/25/2016 16:24:54 Shammas, [name] ([id number]) -------------------------------------------------------------------------------- Non-Wound Condition Assessment Details Patient Name: [name], [name] Date of Service: 03/25/2016 3:45 PM Medical Record Patient Account Number: [id number] Number: Treating RN: Huel Coventry 07/13/1936 (80 y.o. Other Clinician: Date of Birth/Sex: Female) Treating ROBSON, [name] Primary Care Physician: [name] Physician/Extender: G Referring Physician: Thedore Mins, [name] Weeks in Treatment: 23 Non-Wound Condition: Condition: Other Dermatologic Condition Location: Gluteus Side: Left Photos Periwound Skin Texture Texture Color No Abnormalities Noted: No No Abnormalities Noted: No Rash: Yes Ecchymosis: Yes Moisture Temperature / Pain No Abnormalities Noted: No Temperature: No Abnormality Dry / Scaly: Yes Electronic Signature(s) Signed: 03/26/2016 4:59:47 PM By: Elliot Gurney, RN, [name], Kim RN, [name] Entered By: Elliot Gurney, RN, [name], Kim on 03/25/2016 17:18:00 Galea, [name] ([id number]) -------------------------------------------------------------------------------- Pain Assessment Details Patient Name: [name], [name] Date of Service: 03/25/2016 3:45 PM Medical Record Patient Account Number: [id number] Number: Treating RN: Huel Coventry 1935-12-29 (80 y.o. Other Clinician: Date of Birth/Sex: Female) Treating ROBSON, [name] Primary Care Physician: [name] Physician/Extender: G Referring Physician: [name] Weeks in Treatment: 23 Active Problems Location of Pain Severity and Description of Pain Patient Has Paino Patient Unable to Respond Site Locations Pain Management and Medication Current Pain Management: Electronic Signature(s) Signed: 03/26/2016 4:59:47 PM By: Elliot Gurney, RN, [name], Kim RN, [name] Entered By: Elliot Gurney, RN, [name], Kim on 03/25/2016 16:10:08 Barrasso, [name] ([id number]) -------------------------------------------------------------------------------- Patient/Caregiver Education Details Patient Name: Carder, [name] Date of Service: 03/25/2016 3:45 PM Medical Record Patient Account Number: [id number] Number: Treating RN: Huel Coventry October 04, 1935 (80 y.o. Other Clinician: Date of Birth/Gender: Female) Treating ROBSON, [name] Primary Care Physician: [name] Physician/Extender: G Referring Physician: [name] Weeks in Treatment: 44 Education Assessment Education Provided To: Caregiver Education Topics Provided Wound/Skin Impairment: Handouts: Caring for Your Ulcer, Other: abx cream and barrier daily Methods: Demonstration Responses: State content correctly Electronic Signature(s) Signed: 03/26/2016 4:59:47 PM By: Elliot Gurney, RN, [name], Kim RN, [name] Entered By: Elliot Gurney, RN, [name], Kim on 03/25/2016 17:07:06 Bayle, [name] ([id number]) -------------------------------------------------------------------------------- Wound Assessment Details Patient Name: Derenzo, [name] Date of Service: 03/25/2016 3:45 PM Medical Record Patient Account Number: [id number] Number: Treating RN: Huel Coventry April 16, 1936 (80 y.o. Other Clinician: Date of Birth/Sex: Female) Treating ROBSON, [name] Primary Care Physician: [name] Physician/Extender: G Referring Physician:  Thedore Mins, [name] Weeks in Treatment: 23 Wound Status Wound Number: 1 Primary Etiology: Pressure Ulcer Wound Location: Sacrum Wound Status: Open Wounding Event: Gradually Appeared Date Acquired: 09/10/2015 Weeks Of Treatment: 23 Clustered Wound: No Photos Photo Uploaded By: Elliot Gurney, RN, [name], Kim on 03/25/2016 17:16:28 Wound Measurements Length: (cm) 0 % Reduction in Width: (cm) 0 %  Reduction in Depth: (cm) 0 Area: (cm) 0 Volume: (cm) 0 Area: 100% Volume: 100% Wound Description Classification: Category/Stage IV Periwound Skin Texture Texture Color No Abnormalities Noted: No No Abnormalities Noted: No Moisture No Abnormalities Noted: No Electronic Signature(s) Fackrell, [name] ([id number]) Signed: 03/26/2016 4:59:47 PM By: Elliot Gurney, RN, [name], Kim RN, [name] Entered By: Elliot Gurney, RN, [name], Kim on 03/25/2016 16:15:56 Lycan, [name] ([id number]) -------------------------------------------------------------------------------- Vitals Details Patient Name: Kalil, [name] Date of Service: 03/25/2016 3:45 PM Medical Record Patient Account Number: [id number] Number: Treating RN: Huel Coventry Jan 27, 1936 (80 y.o. Other Clinician: Date of Birth/Sex: Female) Treating ROBSON, [name] Primary Care Physician: [name] Physician/Extender: G Referring Physician: Thedore Mins, [name] Weeks in Treatment: 23 Vital Signs Time Taken: 16:10 Temperature (F): 97.7 Height (in): 63 Pulse (bpm): 82 Respiratory Rate (breaths/min): 18 Blood Pressure (mmHg): 111/56 Reference Range: 80 - 120 mg / dl Electronic Signature(s) Signed: 03/26/2016 4:59:47 PM By: Elliot Gurney, RN, [name], Kim RN, [name] Entered By: Elliot Gurney, RN, [name], Kim on 03/25/2016 16:11:46

## 2016-04-01 ENCOUNTER — Encounter: Payer: Medicare Other | Admitting: Internal Medicine

## 2016-04-01 DIAGNOSIS — Z880 Allergy status to penicillin: Secondary | ICD-10-CM | POA: Diagnosis not present

## 2016-04-01 DIAGNOSIS — L89154 Pressure ulcer of sacral region, stage 4: Secondary | ICD-10-CM | POA: Diagnosis not present

## 2016-04-01 DIAGNOSIS — M1990 Unspecified osteoarthritis, unspecified site: Secondary | ICD-10-CM | POA: Diagnosis not present

## 2016-04-01 DIAGNOSIS — Z87891 Personal history of nicotine dependence: Secondary | ICD-10-CM | POA: Diagnosis not present

## 2016-04-01 DIAGNOSIS — D649 Anemia, unspecified: Secondary | ICD-10-CM | POA: Diagnosis not present

## 2016-04-01 DIAGNOSIS — G309 Alzheimer's disease, unspecified: Secondary | ICD-10-CM | POA: Diagnosis not present

## 2016-04-01 DIAGNOSIS — I252 Old myocardial infarction: Secondary | ICD-10-CM | POA: Diagnosis not present

## 2016-04-01 DIAGNOSIS — I10 Essential (primary) hypertension: Secondary | ICD-10-CM | POA: Diagnosis not present

## 2016-04-01 DIAGNOSIS — E43 Unspecified severe protein-calorie malnutrition: Secondary | ICD-10-CM | POA: Diagnosis not present

## 2016-04-01 DIAGNOSIS — B028 Zoster with other complications: Secondary | ICD-10-CM | POA: Diagnosis not present

## 2016-04-02 NOTE — Progress Notes (Addendum)
[name] ([id number]) Visit Report for 04/01/2016 Arrival Information Details Patient Name: [name], [name] Date of Service: 04/01/2016 3:45 PM Medical Record Patient Account Number: [id number] Number: Treating RN: [name] Mealy, RN, [name], [name] October 20, 1935 [phone] y.o. Other Clinician: Date of Birth/Sex: Female) Treating ROBSON, [name] Primary Care Physician: [name] Physician/Extender: G Referring Physician: Leotis [name] Weeks in Treatment: 24 Visit Information History Since Last Visit All ordered tests and consults were completed: No Patient Arrived: Wheel Chair Added or deleted any medications: No Arrival Time: 15:55 Any new allergies or adverse reactions: No Accompanied By: dtr Had a fall or experienced change in No Transfer Assistance: EasyPivot activities of daily living that may affect Patient Lift risk of falls: Patient Identification Verified: Yes Signs or symptoms of abuse/neglect since last No Secondary Verification Process Yes visito Completed: Pain Present Now: No Patient Requires Transmission- No Based Precautions: Patient Has Alerts: No Electronic Signature(s) Signed: 04/01/2016 4:38:41 PM By: [name] [name], RN Entered By: [name] on 04/01/2016 15:55:46 [name] ([id number]) -------------------------------------------------------------------------------- Clinic Level of Care Assessment Details Patient Name: [name] Date of Service: 04/01/2016 3:45 PM Medical Record Patient Account Number: [id number] Number: Treating RN: [name] Mealy, RN, [name], [name] 1936-02-01 [phone] y.o. Other Clinician: Date of Birth/Sex: Female) Treating ROBSON, [name] Primary Care Physician: [name] Physician/Extender: G Referring Physician: Thedore Mins, [name] Weeks in Treatment: 24 Clinic Level of Care Assessment Items TOOL 4 Quantity Score []  - Use when only an EandM is performed on FOLLOW-UP visit 0 ASSESSMENTS - Nursing Assessment  / Reassessment X - Reassessment of Co-morbidities (includes updates in patient status) 1 10 X - Reassessment of Adherence to Treatment Plan 1 5 ASSESSMENTS - Wound and Skin Assessment / Reassessment X - Simple Wound Assessment / Reassessment - one wound 1 5 []  - Complex Wound Assessment / Reassessment - multiple wounds 0 X - Dermatologic / Skin Assessment (not related to wound area) 1 10 ASSESSMENTS - Focused Assessment []  - Circumferential Edema Measurements - multi extremities 0 []  - Nutritional Assessment / Counseling / Intervention 0 []  - Lower Extremity Assessment (monofilament, tuning fork, pulses) 0 []  - Peripheral Arterial Disease Assessment (using hand held doppler) 0 ASSESSMENTS - Ostomy and/or Continence Assessment and Care []  - Incontinence Assessment and Management 0 []  - Ostomy Care Assessment and Management (repouching, etc.) 0 PROCESS - Coordination of Care X - Simple Patient / Family Education for ongoing care 1 15 []  - Complex (extensive) Patient / Family Education for ongoing care 0 []  - Staff obtains Chiropractor, Records, Test Results / Process Orders 0 []  - Staff telephones HHA, Nursing Homes / Clarify orders / etc 0 [name], [name] ([id number]) []  - Routine Transfer to another Facility (non-emergent condition) 0 []  - Routine Hospital Admission (non-emergent condition) 0 []  - New Admissions / Manufacturing engineer / Ordering NPWT, Apligraf, etc. 0 []  - Emergency Hospital Admission (emergent condition) 0 []  - Simple Discharge Coordination 0 []  - Complex (extensive) Discharge Coordination 0 PROCESS - Special Needs []  - Pediatric / Minor Patient Management 0 []  - Isolation Patient Management 0 []  - Hearing / Language / Visual special needs 0 []  - Assessment of Community assistance (transportation, D/C planning, etc.) 0 []  - Additional assistance / Altered mentation 0 []  - Support Surface(s) Assessment (bed, cushion, seat, etc.) 0 INTERVENTIONS - Wound Cleansing /  Measurement []  - Simple Wound Cleansing - one wound 0 []  - Complex Wound Cleansing - multiple wounds 0 X - Wound Imaging (photographs - any number  of wounds) 1 5 []  - Wound Tracing (instead of photographs) 0 X - Simple Wound Measurement - one wound 1 5 []  - Complex Wound Measurement - multiple wounds 0 INTERVENTIONS - Wound Dressings []  - Small Wound Dressing one or multiple wounds 0 []  - Medium Wound Dressing one or multiple wounds 0 []  - Large Wound Dressing one or multiple wounds 0 X - Application of Medications - topical 1 5 []  - Application of Medications - injection 0 [name], [name] ([id number]) INTERVENTIONS - Miscellaneous []  - External ear exam 0 []  - Specimen Collection (cultures, biopsies, blood, body fluids, etc.) 0 []  - Specimen(s) / Culture(s) sent or taken to Lab for analysis 0 []  - Patient Transfer (multiple staff / Michiel SitesHoyer Lift / Similar devices) 0 []  - Simple Staple / Suture removal (25 or less) 0 []  - Complex Staple / Suture removal (26 or more) 0 []  - Hypo / Hyperglycemic Management (close monitor of Blood Glucose) 0 []  - Ankle / Brachial Index (ABI) - do not check if billed separately 0 X - Vital Signs 1 5 Has the patient been seen at the hospital within the last three years: Yes Total Score: 65 Level Of Care: New/Established - Level 2 Electronic Signature(s) Signed: 04/03/2016 3:26:34 PM By: [name] EricAfful, [name] [name], RN Entered By: [name] EricAfful, [name] on 04/03/2016 08:03:25 [name], [name] ([id number]) -------------------------------------------------------------------------------- Encounter Discharge Information Details Patient Name: [name], [name] Date of Service: 04/01/2016 3:45 PM Medical Record Patient Account Number: [id number] Number: Treating RN: [name] MealyAfful, RN, [name], [name] 1935-12-19 [phone](80 y.o. Other Clinician: Date of Birth/Sex: Female) Treating ROBSON, [name] Primary Care Physician: [name] ShamesSINGH, [name] Physician/Extender: G Referring Physician: Thedore MinsSINGH,  [name] Weeks in Treatment: 24 Encounter Discharge Information Items Discharge Pain Level: 0 Discharge Condition: Stable Ambulatory Status: Wheelchair Discharge Destination: Home Transportation: Private Auto Accompanied By: dtr, grdt Schedule Follow-up Appointment: No Medication Reconciliation completed and provided to Patient/Care No Madigan Rosensteel: Provided on Clinical Summary of Care: 04/01/2016 Form Type Recipient Paper Patient Ann MakiWJ Electronic Signature(s) Signed: 04/01/2016 4:38:41 PM By: [name] EricAfful, [name] [name], RN Previous Signature: 04/01/2016 4:17:14 PM Version By: Gwenlyn PerkingMoore, Shelia Entered By: [name] EricAfful, [name] on 04/01/2016 16:20:40 Kaczorowski, [name] ([id number]) -------------------------------------------------------------------------------- Lower Extremity Assessment Details Patient Name: Huizar, [name] Date of Service: 04/01/2016 3:45 PM Medical Record Patient Account Number: [id number] Number: Treating RN: [name] MealyAfful, RN, [name], [name] 1935-12-19 [phone](80 y.o. Other Clinician: Date of Birth/Sex: Female) Treating ROBSON, [name] Primary Care Physician: [name] ShamesSINGH, [name] Physician/Extender: G Referring Physician: Leotis ShamesSINGH, [name] Weeks in Treatment: 24 Electronic Signature(s) Signed: 04/01/2016 4:38:41 PM By: [name] EricAfful, [name] [name], RN Entered By: [name] EricAfful, [name] on 04/01/2016 16:00:19 Dials, [name] ([id number]) -------------------------------------------------------------------------------- Multi Wound Chart Details Patient Name: Baquera, [name] Date of Service: 04/01/2016 3:45 PM Medical Record Patient Account Number: [id number] Number: Treating RN: [name] MealyAfful, RN, [name], [name] 1935-12-19 [phone](80 y.o. Other Clinician: Date of Birth/Sex: Female) Treating ROBSON, [name] Primary Care Physician: [name] ShamesSINGH, [name] Physician/Extender: G Referring Physician: Thedore MinsSINGH, [name] Weeks in Treatment: 24 Photos: [3:No Photos] [N/A:N/A] Wound Location: [3:Left Gluteus] [N/A:N/A] Wounding Event: [3:Other  Lesion] [N/A:N/A] Primary Etiology: [3:Fungal] [N/A:N/A] Comorbid History: [3:Cataracts, Anemia, Arrhythmia, Hypertension, Myocardial Infarction, History of pressure wounds, Osteoarthritis, Dementia] [N/A:N/A] Date Acquired: [3:04/01/2016] [N/A:N/A] Weeks of Treatment: [3:0] [N/A:N/A] Wound Status: [3:Open] [N/A:N/A] Measurements L x W x D 26x15x0.1 [N/A:N/A] (cm) Area (cm) : [3:306.305] [N/A:N/A] Volume (cm) : [3:30.631] [N/A:N/A] Classification: [3:Partial Thickness] [N/A:N/A] Exudate Amount: [3:None Present] [N/A:N/A] Wound Margin: [3:Flat and Intact] [N/A:N/A] Granulation Amount: [3:None Present (0%)] [N/A:N/A] Necrotic Amount: [3:Large (67-100%)] [N/A:N/A]  Necrotic Tissue: [3:Eschar] [N/A:N/A] Exposed Structures: [3:Fascia: No Fat: No Tendon: No Muscle: No Joint: No Bone: No Limited to Skin Breakdown] [N/A:N/A] Epithelialization: [3:None] [N/A:N/A] Periwound Skin Texture: Edema: No [3:Excoriation: No Induration: No Callus: No] [N/A:N/A] Crepitus: No Fluctuance: No Friable: No Rash: No Scarring: No Periwound Skin Dry/Scaly: Yes N/A N/A Moisture: Maceration: No Moist: No Periwound Skin Color: Erythema: Yes N/A N/A Atrophie Blanche: No Cyanosis: No Ecchymosis: No Hemosiderin Staining: No Mottled: No Pallor: No Rubor: No Erythema Location: Circumferential N/A N/A Temperature: No Abnormality N/A N/A Tenderness on No N/A N/A Palpation: Wound Preparation: Ulcer Cleansing: Not N/A N/A Cleansed: rash Topical Anesthetic Applied: None Treatment Notes Electronic Signature(s) Signed: 04/01/2016 4:38:41 PM By: [name] [name], RN Entered By: [name] on 04/01/2016 16:06:42 [name], [name] ([id number]) -------------------------------------------------------------------------------- Multi-Disciplinary Care Plan Details Patient Name: Jovel, [name] Date of Service: 04/01/2016 3:45 PM Medical Record Patient Account Number: [id number] Number: Treating  RN: [name] Mealy, RN, [name], [name] 1935-12-01 [phone] y.o. Other Clinician: Date of Birth/Sex: Female) Treating ROBSON, [name] Primary Care Physician: [name] Physician/Extender: G Referring Physician: Leotis [name] Weeks in Treatment: 24 Active Inactive Electronic Signature(s) Signed: 04/23/2016 11:45:08 AM By: [name] [name], RN Previous Signature: 04/01/2016 4:38:41 PM Version By: [name] [name], RN Entered By: [name] on 04/23/2016 11:45:04 [name], [name] ([id number]) -------------------------------------------------------------------------------- Pain Assessment Details Patient Name: Fanelli, [name] Date of Service: 04/01/2016 3:45 PM Medical Record Patient Account Number: [id number] Number: Treating RN: [name] Mealy, RN, [name], [name] [birth year] [phone] y.o. Other Clinician: Date of Birth/Sex: Female) Treating ROBSON, [name] Primary Care Physician: [name] Physician/Extender: G Referring Physician: Thedore Mins, [name] Weeks in Treatment: 24 Active Problems Location of Pain Severity and Description of Pain Patient Has Paino No Site Locations With Dressing Change: No Pain Management and Medication Current Pain Management: Electronic Signature(s) Signed: 04/01/2016 4:38:41 PM By: [name] [name], RN Entered By: [name] on 04/01/2016 15:57:35 [name], [name] ([id number]) -------------------------------------------------------------------------------- Patient/Caregiver Education Details Patient Name: [name] Date of Service: 04/01/2016 3:45 PM Medical Record Patient Account Number: [id number] Number: Treating RN: [name] Mealy, RN, [name], [name] October 31, 1935 [phone] y.o. Other Clinician: Date of Birth/Gender: Female) Treating ROBSON, [name] Primary Care Physician: [name] Physician/Extender: G Referring Physician: Leotis [name] Weeks in Treatment: 24 Education Assessment Education Provided To: Music therapist, grddtr Education Topics  Provided Basic Hygiene: Methods: Explain/Verbal Responses: State content correctly Elevated Blood Sugar/ Impact on Healing: Methods: Explain/Verbal Responses: State content correctly Nutrition: Methods: Explain/Verbal Responses: State content correctly Pressure: Methods: Explain/Verbal, Video Responses: State content correctly Safety: Methods: Explain/Verbal Responses: State content correctly [facility]: Methods: Explain/Verbal Responses: State content correctly Wound/Skin Impairment: Methods: Explain/Verbal Responses: State content correctly Electronic Signature(s) Signed: 04/01/2016 4:38:41 PM By: [name] [name], RN [name], [name] ([id number]) Entered By: [name] on 04/01/2016 16:21:26 Dave, [name] ([id number]) -------------------------------------------------------------------------------- Wound Assessment Details Patient Name: Whipple, [name] Date of Service: 04/01/2016 3:45 PM Medical Record Patient Account Number: [id number] Number: Treating RN: [name] Mealy, RN, [name], [name] 1936/02/09 [phone] y.o. Other Clinician: Date of Birth/Sex: Female) Treating ROBSON, [name] Primary Care Physician: [name] Physician/Extender: G Referring Physician: Thedore Mins, [name] Weeks in Treatment: 24 Wound Status Wound Number: 3 Primary Fungal Etiology: Wound Location: Left Gluteus Wound Healed - Epithelialized Wounding Event: Other Lesion Status: Date Acquired: 04/01/2016 Comorbid Cataracts, Anemia, Arrhythmia, Weeks Of Treatment: 0 History: Hypertension, Myocardial Infarction, Clustered Wound: No History of pressure wounds, Osteoarthritis, Dementia Photos Wound Measurements Length: (cm) 0 % Reduction in Width: (  cm) 0 % Reduction in Depth: (cm) 0 Epithelializati Area: (cm) 0 Tunneling: Volume: (cm) 0 Undermining: Area: Volume: on: Large (67-100%) No No Wound Description Classification: Partial Thickness Foul Odor  Afte Wound Margin: Flat and Intact Exudate Amount: None Present r Cleansing: No Wound Bed Granulation Amount: None Present (0%) Exposed Structure Necrotic Amount: Large (67-100%) Fascia Exposed: No Necrotic Quality: Eschar Fat Layer Exposed: No Tendon Exposed: No Lindaman, Celeste B. ([id number]) Muscle Exposed: No Joint Exposed: No Bone Exposed: No Limited to Skin Breakdown Periwound Skin Texture Texture Color No Abnormalities Noted: No No Abnormalities Noted: No Callus: No Atrophie Blanche: No Crepitus: No Cyanosis: No Excoriation: No Ecchymosis: No Fluctuance: No Erythema: Yes Friable: No Erythema Location: Circumferential Induration: No Hemosiderin Staining: No Localized Edema: No Mottled: No Rash: No Pallor: No Scarring: No Rubor: No Moisture Temperature / Pain No Abnormalities Noted: No Temperature: No Abnormality Dry / Scaly: Yes Maceration: No Moist: No Wound Preparation Ulcer Cleansing: Not Cleansed: rash, Topical Anesthetic Applied: None Treatment Notes Wound #3 (Left Gluteus) 3. Peri-wound Care: Barrier cream Electronic Signature(s) Signed: 07/31/2016 5:32:54 PM By: [name] [name], RN Previous Signature: 04/01/2016 4:38:41 PM Version By: [name] [name], RN Entered By: [name] on 04/23/2016 11:47:37 Roa, [name] ([id number]) -------------------------------------------------------------------------------- Vitals Details Patient Name: Plair, Ashayla B. Date of Service: 04/01/2016 3:45 PM Medical Record Patient Account Number: [id number] Number: Treating RN: [name] Mealy, RN, [name], [name] [birth year] [phone] y.o. Other Clinician: Date of Birth/Sex: Female) Treating ROBSON, [name] Primary Care Physician: [name] Physician/Extender: G Referring Physician: Thedore Mins, [name] Weeks in Treatment: 24 Vital Signs Time Taken: 15:57 Pulse (bpm): 73 Height (in): 63 Respiratory Rate (breaths/min): 17 Blood Pressure (mmHg): 156/81 Reference  Range: 80 - 120 mg / dl Electronic Signature(s) Signed: 04/01/2016 4:38:41 PM By: [name] [name], RN Entered By: [name] on 04/01/2016 16:11:45

## 2016-04-02 NOTE — Progress Notes (Signed)
[name], [name] ([id number]) Visit Report for 04/01/2016 Chief Complaint Document Details Patient Name: [name], [name] Date of Service: 04/01/2016 3:45 PM Medical Record Patient Account Number: [id number] Number: Treating RN: [name] Mealy, RN, [name], [name] 1936/03/23 [phone] y.o. Other Clinician: Date of Birth/Sex: Female) Treating [name] Primary Care Physician/Extender: [name] Physician: Referring Physician: Leotis [name] Weeks in Treatment: 24 Information Obtained from: Patient Chief Complaint The patient is here for a sacral decubitus ulcer also a more superficial wound over her right hip Electronic Signature(s) Signed: 04/01/2016 4:33:17 PM By: [name] Najjar MD Entered By: [name] on 04/01/2016 16:26:52 [name], [name] ([id number]) -------------------------------------------------------------------------------- HPI Details Patient Name: [name], [name] Date of Service: 04/01/2016 3:45 PM Medical Record Patient Account Number: [id number] Number: Treating RN: [name] Mealy, RN, [name], [name] 1935-11-07 [phone] y.o. Other Clinician: Date of Birth/Sex: Female) Treating [name] Primary Care Physician/Extender: [name] Physician: Referring Physician: Leotis [name] Weeks in Treatment: 24 History of Present Illness HPI Description: 10/15/15; this is a very frail lady with advanced Alzheimer's disease who is being cared for aerobically at home by her daughter and granddaughter. She is essentially nonambulatory and seems to be minimally verbal. They tell me that she started to develop a pressure area in her sacral area in January. Initially a very small open area that progressed up. There is also a more recent area on the right hip. The patient was seen in her primary office for this problem on 2/23 at that point noted to have a stage III wound with tunneling over the sacrum. There were 2 other areas noted on the upper left and right  bilateral occipital. As well as a stage II on the right trochanter. She was referred to the ER because of tachycardia. She was hospitalized from 2/23 through 2/25. The admission this seems to open dominated by a distal fecal impaction. She was noted to have sinus tachycardia which improved. She did have a CT scan of the abdomen and pelvis during the hospitalization. In addition to the fecal impaction this noted the ulcer in the left lower gluteal region adjacent to the midline. There was air in the subcutaneous wound but no evidence of an air-fluid level to suggest an abscess. There was no comment about osteomyelitis. Since her return home she has not been eating well but is drinking. The family provides total care. I note that her albumin was 3 on admission to the hospital. Sodium got as high as 148 but was normalized by the time she left 10/24/15; this is a patient who has advanced dementia. She has a deep probing stage IV pressure ulcer over her lower sacral area. Cultures of this last week grew both Proteus and methicillin-resistant staph aureus. I and empirically put her on Cipro and doxycycline which should cover both of these. He also has a wound over her right greater trochanter. She is been followed by home health at home they're requesting lab work which I think is reasonable. I ordered a CMP, CBC with differential, sedimentation rate and a C-reactive protein 10/30/15 the patient's labs were done by home health but I think they faxed the orders to the primary physician's office. The plain x-ray I did did not show osteomyelitis however it did show fractures of the right superior and inferior pubic rami which are still not fully healed. I informed the patient's family of this. They do give a history of a fall while she was at an assisted living in [place] in November afterward  she had some trouble walking this may have been the issue. 11/13/15 the wound VAC only started 6 days ago. She is  apparently eating well 11/20/15; the patient continues with the wound VAC without any difficulties. This is being changed by home health. She is apparently eating well 12/04/15; the patient continues with a wound VAC without any difficulties. They're using collagen under the foam being changed by advanced home care. The area over the right greater trochanter is also continued to improve she is eating well and undergoing therapeutic positioning by her granddaughter with regularity 12/18/15; the patient arrives with her daughter and granddaughter the latter of which is the primary caregiver. She continues with the wound VAC with collagen under the foam. She is apparently eating well and they are religiously offloading this area [name], [name] ([id number]) 01/01/16; this is a patient I follow every 2 weeks. She has severe dementia and developed a stage IV pressure ulcer over her lower sacrum and coccyx. In spite of the fact that her Alzheimer's disease is advanced, we have had excellent results using collagen under the foam of wound VAC. She has also had meticulous care for predominantly a granddaughter at home. 01/15/16; I follow this lady every 2 weeks for what was a central stage IV pressure wound over her lower sacrum and coccyx. In spite of advanced Alzheimer's disease in mobility and probably some degree of protein calorie malnutrition she is actually had an excellent result to a wound VAC with silver collagen under the VAC foam. She continues to do well the wound is a lot smaller. The tunneling that I was concerned about last week has resolved although there is still undermining superiorly from roughly 10 to 2:00 6/2 28/17; I follow this lady every 2 weeks for what was a stage IV pressure wound over her lower sacrum and coccyx. In spite of advanced Alzheimer's disease, relatively immobile state she has done exceptionally well with a wound VAC care. She still has undermining from 10 to 2:00  however the base of this is healthy and the orifice is really too small now to continue wound VAC. 02/12/16: Pressure ulcer continues to improve. she is accompanied by her daughter and granddaughter who take excellent care of her. 03/04/16; the surface area of this wound is now very small. Truly remarkable story. She has a slight amount of hyper granulation. She has been using collagen. 03/25/16 this is a woman with advanced Alzheimer's disease. She had a remarkable stage IV wound over her lower sacrum coccyx and surrounding pelvis. She did remarkably well with good care at home by her family and a wound VAC. This wound currently is actually closed and I was prepared to declare this healed and discharge the patient from the clinic. HOWEVER they showed me a rash that it been present for the last week. This is on the lateral aspect of her left buttock. Perhaps 2 inches in width going from the lower end of her left buttock up into her lower back. There is no history of blistering. There is probably some tenderness. 04/01/16; the patch on this lady's left buttock from last week which I thought might be dermatomal/missed the classic zoster-type presentation as clearly expanded on triamcinolone. I am now wondering whether this could be fungal or some other form of contact dermatitis that just doesn't seem to be responding to steroids. Electronic Signature(s) Signed: 04/01/2016 4:33:17 PM By: [name] Najjar MD Entered By: [name] on 04/01/2016 16:28:10 [name], [name] ([id number]) -------------------------------------------------------------------------------- Physical  Exam Details Patient Name: Haub, Lucillia B. Date of Service: 04/01/2016 3:45 PM Medical Record Patient Account Number: [id number] Number: Treating RN: [name] Mealy, RN, [name], [name] 09/01/1935 [phone] y.o. Other Clinician: Date of Birth/Sex: Female) Treating [name] Primary Care Physician/Extender: [name] Latus,  [name] Physician: Referring Physician: Leotis [name] Weeks in Treatment: 24 Constitutional Patient is hypertensive.. Pulse regular and within target range for patient.Marland Kitchen Respirations regular, non-labored and within target range.. Temperature is normal and within the target range for the patient.Marland Kitchen Respiratory Respiratory effort is easy and symmetric bilaterally. Rate is normal at rest and on room air.. Cardiovascular Heart rhythm and rate regular, without murmur or gallop.Marland Kitchen Psychiatric No evidence of depression, anxiety, or agitation. Calm, cooperative, and communicative. Appropriate interactions and affect.. Notes Wound exam; the area on the left buttock does not appear to be painful. I do not believe that this was cellulitis although last week I thought it could've been secondarily infected shingles I don't think this now. I've given her prescription for Lotrisone cream on this I am really not what this is. There is no soft tissue crepitus. She does not appear to be in any pain with this is palpated although this is difficult to tell secondary to advanced dementia Electronic Signature(s) Signed: 04/01/2016 4:33:17 PM By: [name] Najjar MD Entered By: [name] on 04/01/2016 16:29:57 [name], [name] ([id number]) -------------------------------------------------------------------------------- Physician Orders Details Patient Name: Urista, Shirlena B. Date of Service: 04/01/2016 3:45 PM Medical Record Patient Account Number: [id number] Number: Treating RN: [name] Mealy, RN, [name], [name] 11/20/1935 [phone] y.o. Other Clinician: Date of Birth/Sex: Female) Treating [name] Primary Care Physician/Extender: [name] Physician: Referring Physician: Leotis [name] Weeks in Treatment: 80 Verbal / Phone Orders: Yes Clinician: [name], RN, [name], [name] Read Back and Verified: Yes Diagnosis Coding Wound Cleansing Wound #3 Left Gluteus o Cleanse wound with mild soap and  water Skin Barriers/Peri-Wound Care Wound #3 Left Gluteus o Barrier cream - Desitin Medications-please add to medication list. Wound #3 Left Gluteus o Other: - Lotrisone Cream Electronic Signature(s) Signed: 04/01/2016 4:33:17 PM By: [name] Najjar MD Signed: 04/01/2016 4:38:41 PM By: Elpidio Eric [name], RN Entered By: Elpidio Eric on 04/01/2016 16:11:00 [name], [name] ([id number]) -------------------------------------------------------------------------------- Problem List Details Patient Name: [name], [name] Date of Service: 04/01/2016 3:45 PM Medical Record Patient Account Number: [id number] Number: Treating RN: [name] Mealy, RN, [name], [name] 05/19/1936 [phone] y.o. Other Clinician: Date of Birth/Sex: Female) Treating [name] Primary Care Physician/Extender: [name] Physician: Referring Physician: Leotis [name] Weeks in Treatment: 24 Active Problems ICD-10 Encounter Code Description Active Date Diagnosis E43 Unspecified severe protein-calorie malnutrition 10/15/2015 Yes B02.8 Zoster with other complications 03/25/2016 Yes Inactive Problems ICD-10 Code Description Active Date Inactive Date L89.154 Pressure ulcer of sacral region, stage 4 10/15/2015 10/15/2015 Resolved Problems ICD-10 Code Description Active Date Resolved Date L03.317 Cellulitis of buttock 10/15/2015 10/15/2015 L89.213 Pressure ulcer of right hip, stage 3 10/15/2015 10/15/2015 Electronic Signature(s) Signed: 04/01/2016 4:33:17 PM By: [name] Najjar MD Entered By: [name] on 04/01/2016 16:26:41 [name], [name] ([id number]) -------------------------------------------------------------------------------- Progress Note Details Patient Name: [name], [name] Date of Service: 04/01/2016 3:45 PM Medical Record Patient Account Number: [id number] Number: Treating RN: [name] Mealy, RN, [name], [name] July 29, 1936 [phone] y.o. Other Clinician: Date of Birth/Sex: Female) Treating ROBSON,  [name] Primary Care Physician/Extender: [name] Physician: Referring Physician: Leotis [name] Weeks in Treatment: 24 Subjective Chief Complaint Information obtained from Patient The patient is here for a sacral decubitus ulcer also a more superficial wound over  her right hip History of Present Illness (HPI) 10/15/15; this is a very frail lady with advanced Alzheimer's disease who is being cared for aerobically at home by her daughter and granddaughter. She is essentially nonambulatory and seems to be minimally verbal. They tell me that she started to develop a pressure area in her sacral area in January. Initially a very small open area that progressed up. There is also a more recent area on the right hip. The patient was seen in her primary office for this problem on 2/23 at that point noted to have a stage III wound with tunneling over the sacrum. There were 2 other areas noted on the upper left and right bilateral occipital. As well as a stage II on the right trochanter. She was referred to the ER because of tachycardia. She was hospitalized from 2/23 through 2/25. The admission this seems to open dominated by a distal fecal impaction. She was noted to have sinus tachycardia which improved. She did have a CT scan of the abdomen and pelvis during the hospitalization. In addition to the fecal impaction this noted the ulcer in the left lower gluteal region adjacent to the midline. There was air in the subcutaneous wound but no evidence of an air-fluid level to suggest an abscess. There was no comment about osteomyelitis. Since her return home she has not been eating well but is drinking. The family provides total care. I note that her albumin was 3 on admission to the hospital. Sodium got as high as 148 but was normalized by the time she left 10/24/15; this is a patient who has advanced dementia. She has a deep probing stage IV pressure ulcer over her lower sacral area. Cultures of  this last week grew both Proteus and methicillin-resistant staph aureus. I and empirically put her on Cipro and doxycycline which should cover both of these. He also has a wound over her right greater trochanter. She is been followed by home health at home they're requesting lab work which I think is reasonable. I ordered a CMP, CBC with differential, sedimentation rate and a C-reactive protein 10/30/15 the patient's labs were done by home health but I think they faxed the orders to the primary physician's office. The plain x-ray I did did not show osteomyelitis however it did show fractures of the right superior and inferior pubic rami which are still not fully healed. I informed the patient's family of this. They do give a history of a fall while she was at an assisted living in [place] in November afterward she had some trouble walking this may have been the issue. 11/13/15 the wound VAC only started 6 days ago. She is apparently eating well 11/20/15; the patient continues with the wound VAC without any difficulties. This is being changed by home health. She is apparently eating well [name], [name] ([id number]) 12/04/15; the patient continues with a wound VAC without any difficulties. They're using collagen under the foam being changed by [facility] home [facility]. The area over the right greater trochanter is also continued to improve she is eating well and undergoing therapeutic positioning by her granddaughter with regularity 12/18/15; the patient arrives with her daughter and granddaughter the latter of which is the primary caregiver. She continues with the wound VAC with collagen under the foam. She is apparently eating well and they are religiously offloading this area 01/01/16; this is a patient I follow every 2 weeks. She has severe dementia and developed a stage IV pressure ulcer over  her lower sacrum and coccyx. In spite of the fact that her Alzheimer's disease is advanced, we have had  excellent results using collagen under the foam of wound VAC. She has also had meticulous care for predominantly a granddaughter at home. 01/15/16; I follow this lady every 2 weeks for what was a central stage IV pressure wound over her lower sacrum and coccyx. In spite of advanced Alzheimer's disease in mobility and probably some degree of protein calorie malnutrition she is actually had an excellent result to a wound VAC with silver collagen under the VAC foam. She continues to do well the wound is a lot smaller. The tunneling that I was concerned about last week has resolved although there is still undermining superiorly from roughly 10 to 2:00 6/2 28/17; I follow this lady every 2 weeks for what was a stage IV pressure wound over her lower sacrum and coccyx. In spite of advanced Alzheimer's disease, relatively immobile state she has done exceptionally well with a wound VAC care. She still has undermining from 10 to 2:00 however the base of this is healthy and the orifice is really too small now to continue wound VAC. 02/12/16: Pressure ulcer continues to improve. she is accompanied by her daughter and granddaughter who take excellent care of her. 03/04/16; the surface area of this wound is now very small. Truly remarkable story. She has a slight amount of hyper granulation. She has been using collagen. 03/25/16 this is a woman with advanced Alzheimer's disease. She had a remarkable stage IV wound over her lower sacrum coccyx and surrounding pelvis. She did remarkably well with good care at home by her family and a wound VAC. This wound currently is actually closed and I was prepared to declare this healed and discharge the patient from the clinic. HOWEVER they showed me a rash that it been present for the last week. This is on the lateral aspect of her left buttock. Perhaps 2 inches in width going from the lower end of her left buttock up into her lower back. There is no history of blistering.  There is probably some tenderness. 04/01/16; the patch on this lady's left buttock from last week which I thought might be dermatomal/missed the classic zoster-type presentation as clearly expanded on triamcinolone. I am now wondering whether this could be fungal or some other form of contact dermatitis that just doesn't seem to be responding to steroids. Objective Constitutional Patient is hypertensive.. Pulse regular and within target range for patient.Marland Kitchen Respirations regular, non-labored and within target range.. Temperature is normal and within the target range for the patient.. Vitals Time Taken: 3:57 PM, Height: 63 in, Pulse: 73 bpm, Respiratory Rate: 17 breaths/min, Blood Pressure: 156/81 mmHg. Respiratory Giammona, Kenneisha B. ([id number]) Respiratory effort is easy and symmetric bilaterally. Rate is normal at rest and on room air.. Cardiovascular Heart rhythm and rate regular, without murmur or gallop.Marland Kitchen Psychiatric No evidence of depression, anxiety, or agitation. Calm, cooperative, and communicative. Appropriate interactions and affect.. General Notes: Wound exam; the area on the left buttock does not appear to be painful. I do not believe that this was cellulitis although last week I thought it could've been secondarily infected shingles I don't think this now. I've given her prescription for Lotrisone cream on this I am really not what this is. There is no soft tissue crepitus. She does not appear to be in any pain with this is palpated although this is difficult to tell secondary to advanced dementia Integumentary (Hair, Skin)  Wound #3 status is Open. Original cause of wound was Other Lesion. The wound is located on the Left Gluteus. The wound measures 26cm length x 15cm width x 0.1cm depth; 306.305cm^2 area and 30.631cm^3 volume. The wound is limited to skin breakdown. There is no tunneling or undermining noted. There is a none present amount of drainage noted. The wound margin is  flat and intact. There is no granulation within the wound bed. There is a large (67-100%) amount of necrotic tissue within the wound bed including Eschar. The periwound skin appearance exhibited: Dry/Scaly, Erythema. The periwound skin appearance did not exhibit: Callus, Crepitus, Excoriation, Fluctuance, Friable, Induration, Localized Edema, Rash, Scarring, Maceration, Moist, Atrophie Blanche, Cyanosis, Ecchymosis, Hemosiderin Staining, Mottled, Pallor, Rubor. The surrounding wound skin color is noted with erythema which is circumferential. Periwound temperature was noted as No Abnormality. Assessment Active Problems ICD-10 E43 - Unspecified severe protein-calorie malnutrition B02.8 - Zoster with other complications Plan Wound Cleansing: Wound #3 Left Gluteus: Cleanse wound with mild soap and water Skin Barriers/Peri-Wound Care: [place], [name] ([id number]) Wound #3 Left Gluteus: Barrier cream - Desitin Medications-please add to medication list.: Wound #3 Left Gluteus: Other: - Lotrisone Cream #1 prescription given for triamcinolone under barrier cream. I don't think this needs any topical covering. #2 her original extensive sacral/coccyx pressure area has healed Electronic Signature(s) Signed: 04/01/2016 4:33:17 PM By: [name] Najjar MD Entered By: [name] on 04/01/2016 16:30:35 Rizzolo, [name] ([id number]) -------------------------------------------------------------------------------- SuperBill Details Patient Name: Lorensen, [name] Date of Service: 04/01/2016 Medical Record Patient Account Number: [id number] Number: Treating RN: [name] Mealy, RN, [name], [name] May 21, 1936 [phone] y.o. Other Clinician: Date of Birth/Sex: Female) Treating [name] Primary Care Physician/Extender: [name] Physician: Weeks in Treatment: 24 Referring Physician: [facility], [name] Diagnosis Coding ICD-10 Codes Code Description E43 Unspecified severe protein-calorie  malnutrition B02.8 Zoster with other complications Physician Procedures CPT4 Code: [id number] Description: 99213 - WC PHYS LEVEL 3 - EST PT ICD-10 Description Diagnosis B02.8 Zoster with other complications Modifier: Quantity: 1 Electronic Signature(s) Signed: 04/01/2016 4:33:17 PM By: [name] Najjar MD Entered By: [name] on 04/01/2016 16:31:14

## 2016-04-18 DIAGNOSIS — L89159 Pressure ulcer of sacral region, unspecified stage: Secondary | ICD-10-CM | POA: Diagnosis not present

## 2016-04-22 ENCOUNTER — Ambulatory Visit: Payer: Medicare Other | Admitting: Internal Medicine

## 2016-04-22 DIAGNOSIS — L89150 Pressure ulcer of sacral region, unstageable: Secondary | ICD-10-CM | POA: Diagnosis not present

## 2016-04-22 DIAGNOSIS — L89154 Pressure ulcer of sacral region, stage 4: Secondary | ICD-10-CM | POA: Diagnosis not present

## 2016-06-03 IMAGING — CT CT ABD-PELV W/ CM
2 of 5 series · 15 of 46 positions shown, 17 images · IV contrast (omnipaque)
Comparison: None.

CLINICAL DATA: Abdominal pain

EXAM:
CT ABDOMEN AND PELVIS WITH CONTRAST
TECHNIQUE: Multidetector CT imaging of the abdomen and pelvis was performed
using the standard protocol following bolus administration of
intravenous contrast.
CONTRAST:  80mL OMNIPAQUE IOHEXOL 300 MG/ML  SOLN

[Series 3: routine abd pel with · axial · 0.62mm/px · z∈[-850,-490]mm · 12 of 82 slices shown, 14 images]
[im 5/82  soft-tissue]
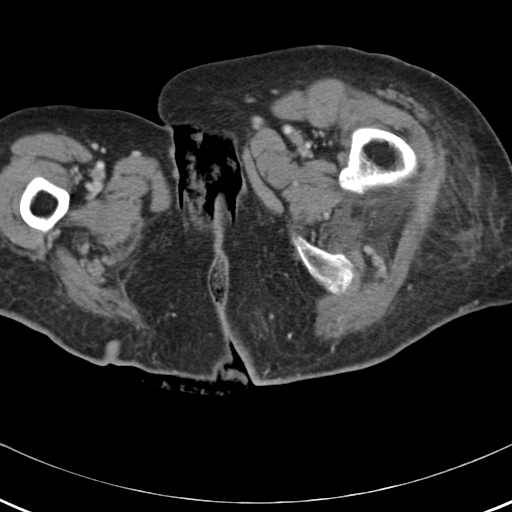
[im 5/82  bone]
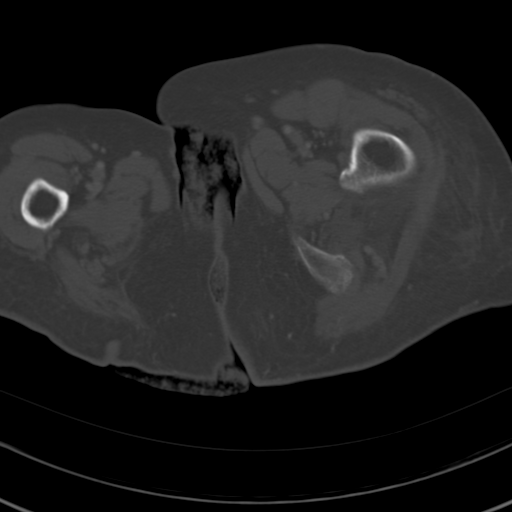
[im 13/82  soft-tissue]
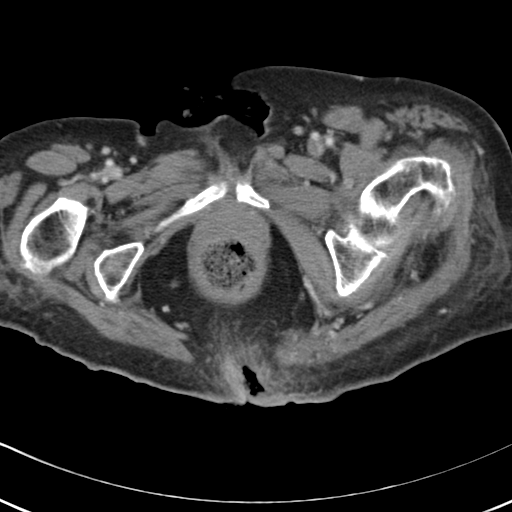
[im 17/82  soft-tissue]
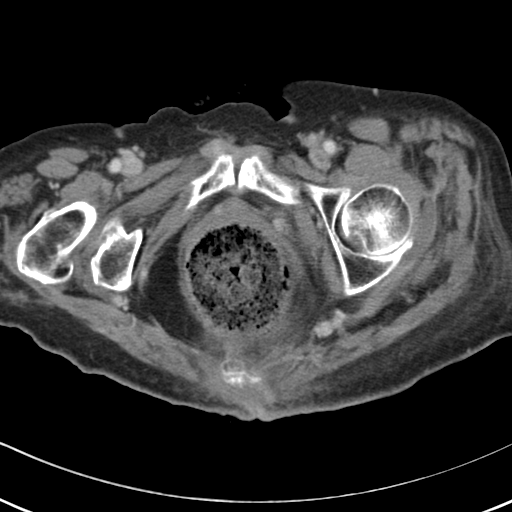
[im 25/82  soft-tissue]
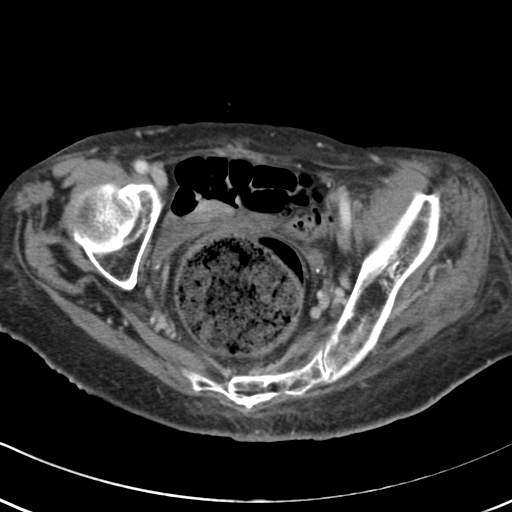
[im 33/82  soft-tissue]
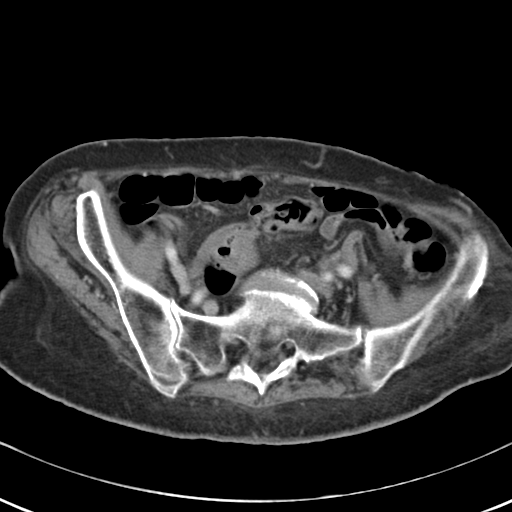
[im 37/82  soft-tissue]
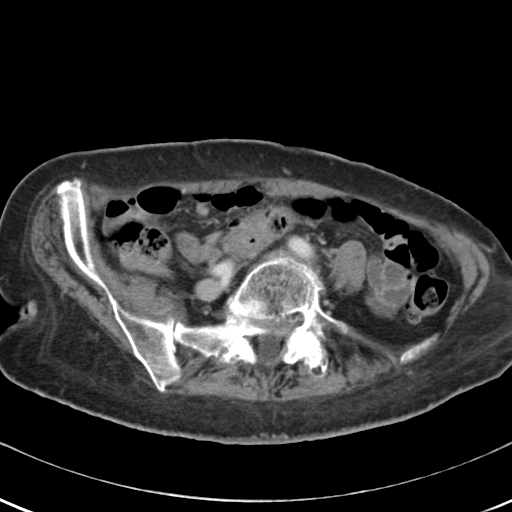
[im 45/82  soft-tissue]
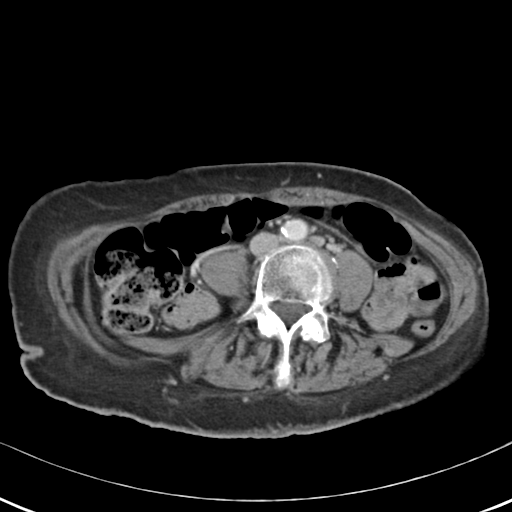
[im 49/82  soft-tissue]
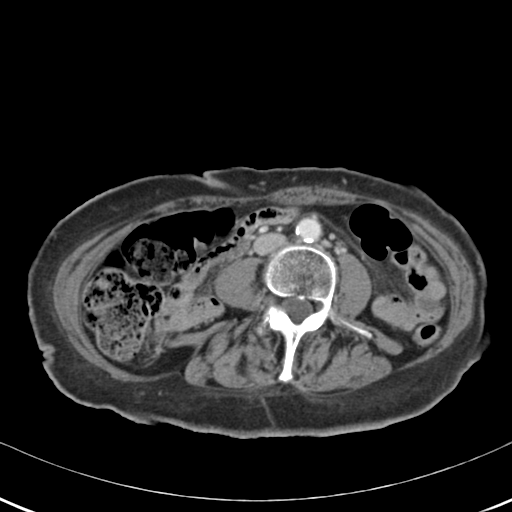
[im 57/82  soft-tissue]
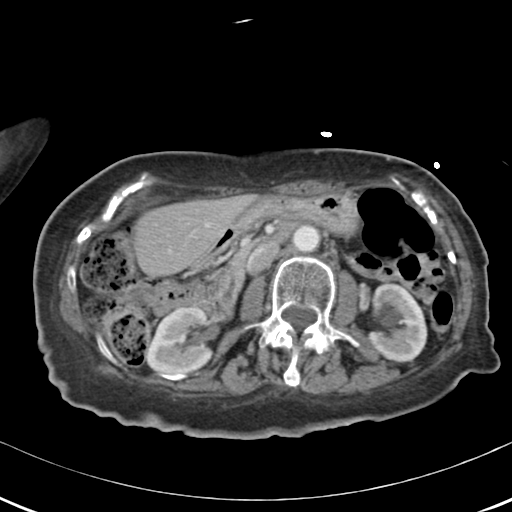
[im 57/82  bone]
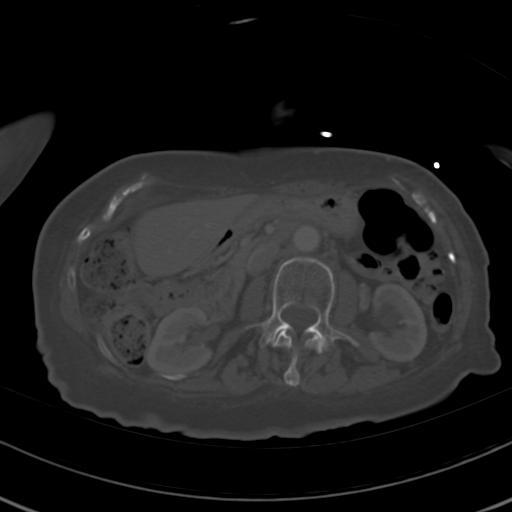
[im 65/82  soft-tissue]
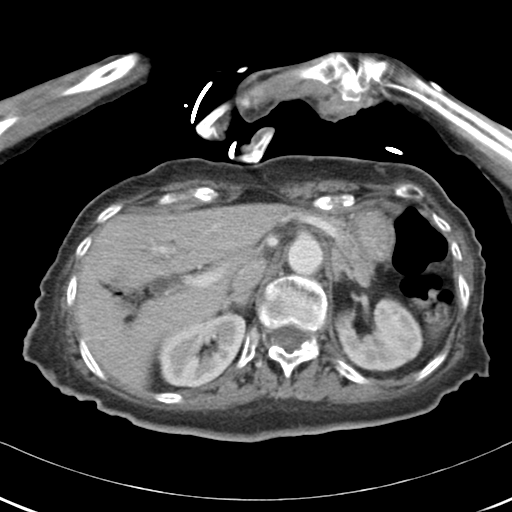
[im 69/82  soft-tissue]
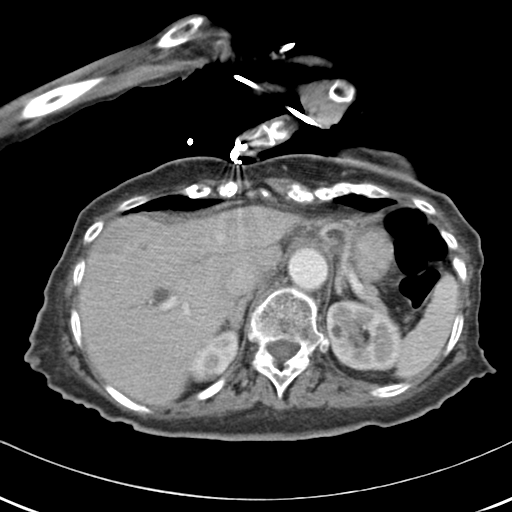
[im 77/82  soft-tissue]
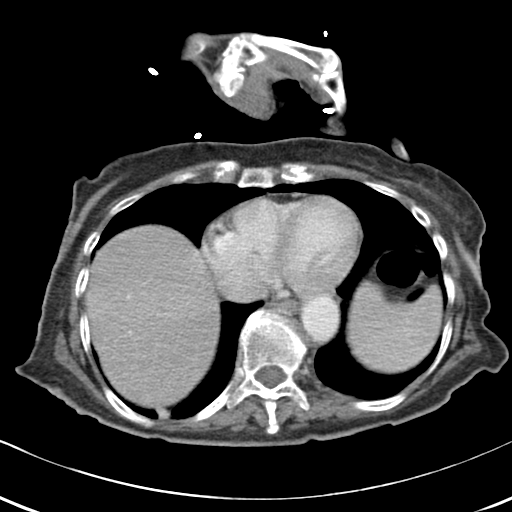

[Series 7: cor routine abd pel with · coronal · 0.67mm/px · 3 of 115 slices shown]
[im 39/115  soft-tissue]
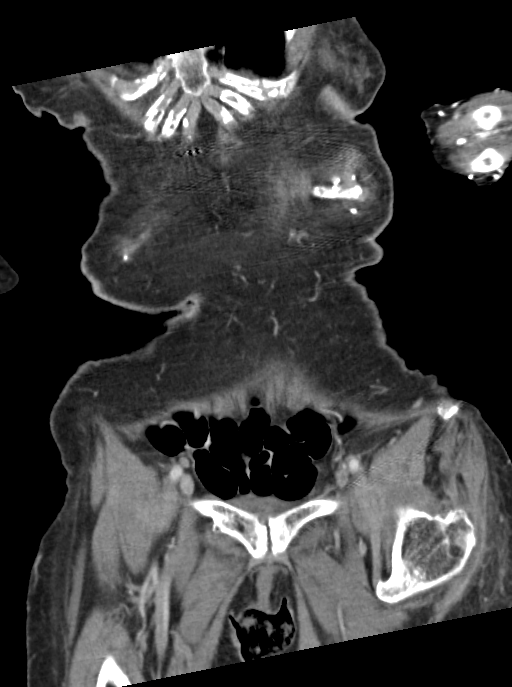
[im 51/115  soft-tissue]
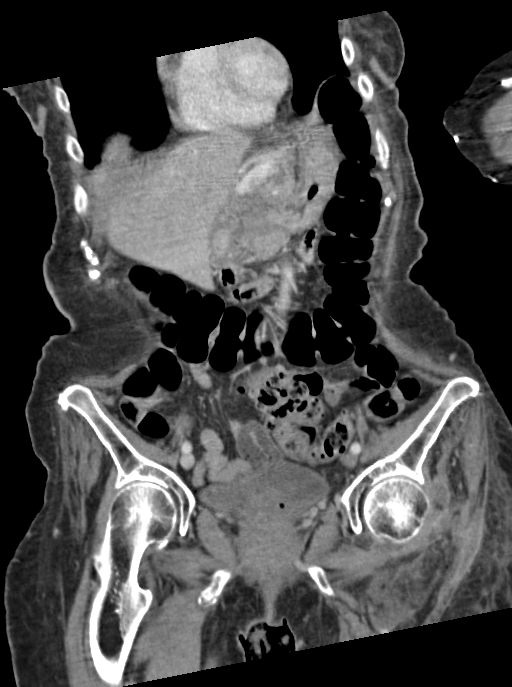
[im 64/115  soft-tissue]
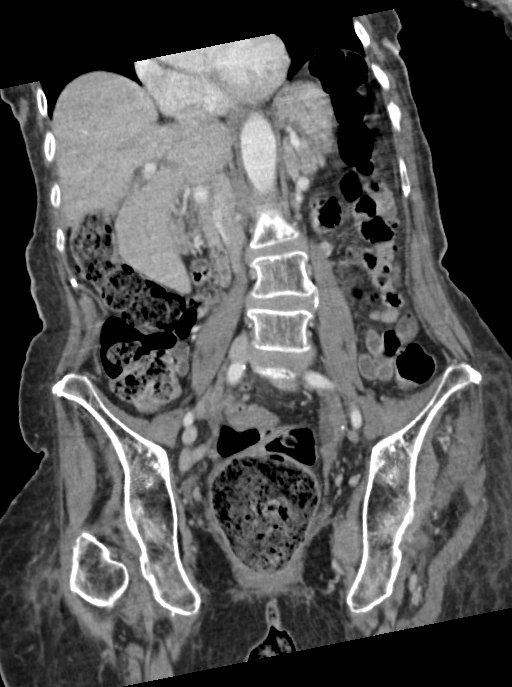

[15 of 46 positions shown; findings below may reference images not displayed]

FINDINGS: Lung bases are unremarkable. Sagittal images of the spine shows
diffuse osteopenia. There is mild about 4 mm anterolisthesis L5 on
S1 vertebral body.

Heart size within normal limits.  Mild levoscoliosis lumbar spine.

Enhanced liver shows no focal mass. There is mild intrahepatic
biliary ductal dilatation. There gallbladder is not identified
probable surgically absent. No aortic aneurysm. Mild atherosclerotic
calcifications of distal abdominal aorta.

The pancreas, spleen and adrenal glands are unremarkable. Kidneys
are symmetrical in size and enhancement. No hydronephrosis or
hydroureter. Delayed renal images shows bilateral renal symmetrical
excretion. Bilateral visualized proximal ureter is unremarkable.
Tiny left renal cysts are noted the largest measures 6.5 mm.

There is no small bowel obstruction.  No ascites or free air.

Moderate stool noted within cecum and right colon. Some colonic gas
noted within transverse colon. Few colonic diverticula are noted
descending colon. No evidence of colitis or diverticulitis. Multiple
colonic diverticula are noted sigmoid colon. There is no evidence of
acute diverticulitis. There is significant distension of the rectum
with abundant stool measures at least 8.5 cm in diameter. This is
consistent with fecal impaction.

Axial image 66 there is a skin defect and probable decubitus wound
in left lower gluteal region adjacent to the midline please see
axial image 69. There is air within subcutaneous wound measures
about 1.9 cm. There is no evidence of air-fluid level to suggest an
abscess. Some adjacent skin is thickening is noted probable mild
cellulitis. No destructive bony lesions are noted within pelvis.
Degenerative changes bilateral SI joints. The urinary bladder is
decompressed.
IMPRESSION: 1. No hydronephrosis or hydroureter. Small left renal cysts are
noted.
2. No small bowel obstruction.  No ascites or free air.
3. Moderate stool noted in right colon and proximal transverse
colon. Significant distension of the rectum with abundant stool up
to 8.5 cm. Findings highly suspicious for distal fecal impaction.
4. Axial image 66 there is a skin defect and probable decubitus
wound in left lower gluteal region adjacent to the midline please
see axial image 69. There is air within subcutaneous wound measures
about 1.9 cm. There is no evidence of air-fluid level to suggest an
abscess. Some adjacent skin is thickening is noted probable mild
cellulitis.
5. Osteopenia and mild degenerative changes lumbar spine. Mild
degenerative changes bilateral SI joints.

## 2016-06-05 IMAGING — DX DG ABDOMEN 1V
1 series · 1 of 1 positions shown · non-contrast
Comparison: CT scan September 26, 2015

CLINICAL DATA: Recent onset constipation.

EXAM:
ABDOMEN - 1 VIEW

[abdomen kub]
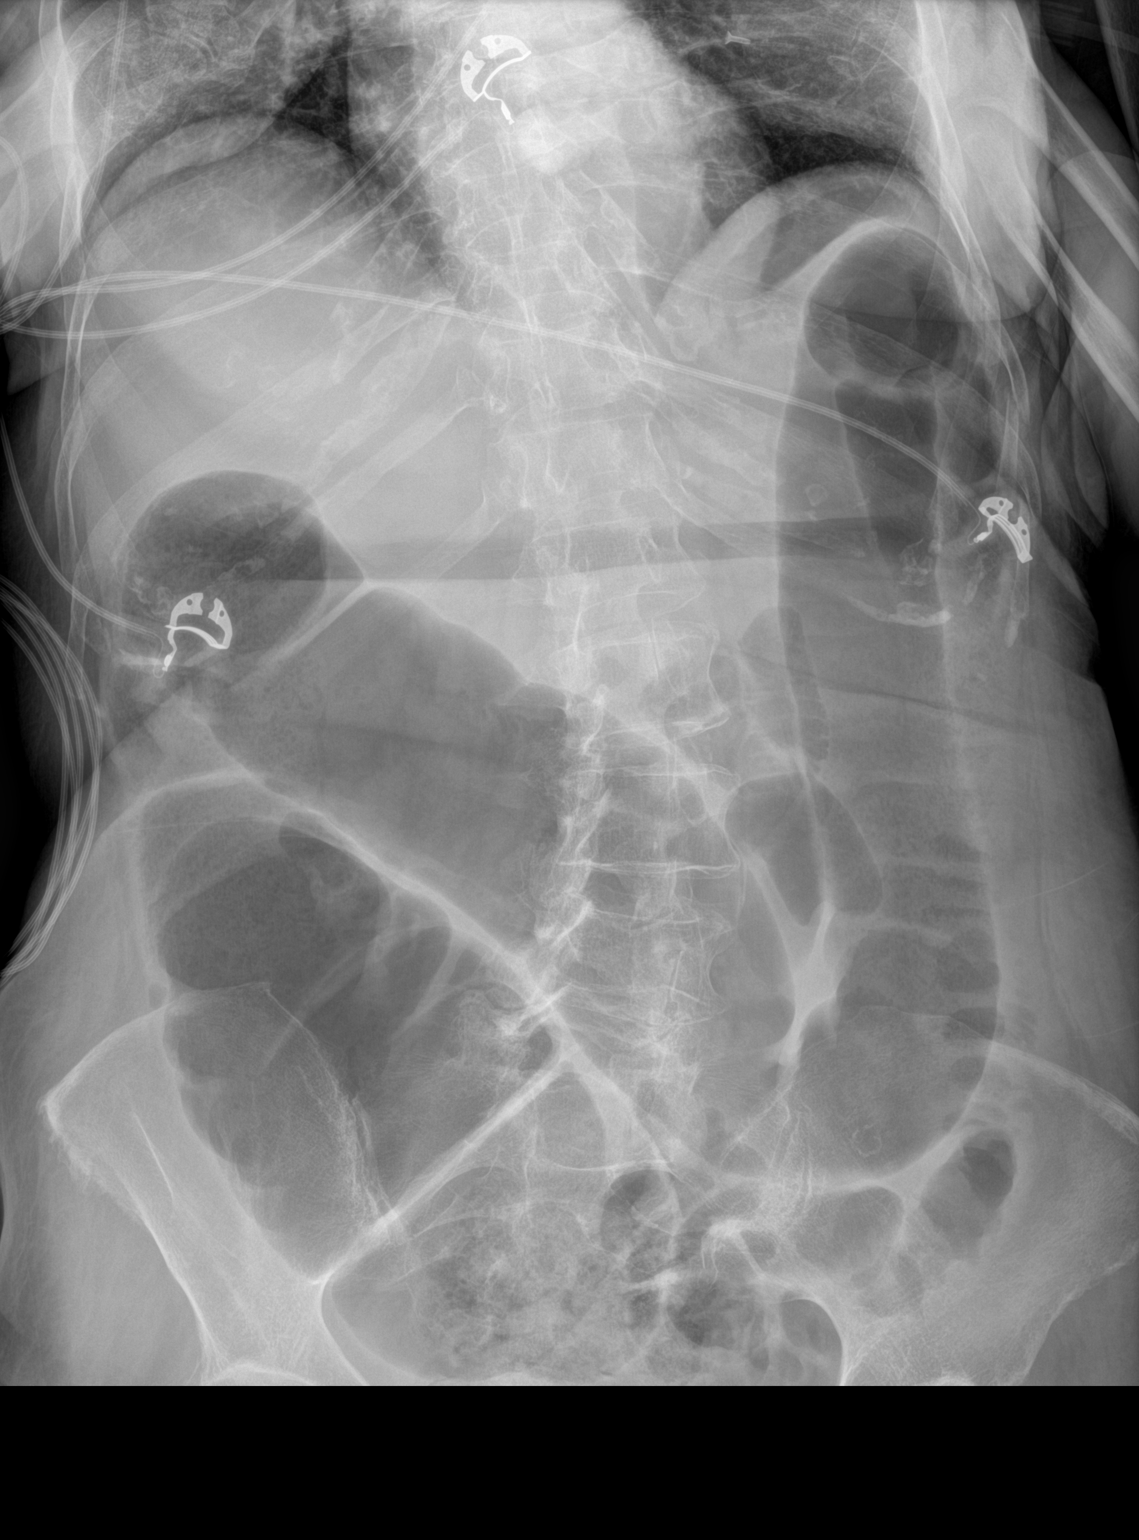

[1 of 1 positions shown; findings below may reference images not displayed]

FINDINGS: A large stool ball is again seen in the rectum. There is increasing
distention of the colon. The cecum measures 9.6 cm. No small bowel
dilatation identified. Evaluation for free air is limited on supine
imaging but none is seen. No other acute abnormalities.
IMPRESSION: 1. Large stool ball in the rectum.
2. Increasing gaseous distention of the colon consistent with a
relative obstruction from the distal stool ball. No small bowel
dilatation at this time.

## 2016-06-14 DIAGNOSIS — R6889 Other general symptoms and signs: Secondary | ICD-10-CM | POA: Diagnosis not present

## 2016-06-14 DIAGNOSIS — Z7401 Bed confinement status: Secondary | ICD-10-CM | POA: Diagnosis not present

## 2016-07-03 DEATH — deceased

## 2016-10-01 DEATH — deceased
